# Patient Record
Sex: Male | Born: 1945 | Race: White | Hispanic: No | Marital: Married | Smoking: Former smoker
Health system: Southern US, Community
[De-identification: ages and names within clinical notes are randomized; demographics above are authoritative.]

## PROBLEM LIST (undated history)

## (undated) DIAGNOSIS — I25810 Atherosclerosis of coronary artery bypass graft(s) without angina pectoris: Secondary | ICD-10-CM

## (undated) DIAGNOSIS — Z87448 Personal history of other diseases of urinary system: Secondary | ICD-10-CM

## (undated) DIAGNOSIS — E785 Hyperlipidemia, unspecified: Secondary | ICD-10-CM

## (undated) DIAGNOSIS — I10 Essential (primary) hypertension: Secondary | ICD-10-CM

## (undated) DIAGNOSIS — I83009 Varicose veins of unspecified lower extremity with ulcer of unspecified site: Secondary | ICD-10-CM

## (undated) DIAGNOSIS — I6529 Occlusion and stenosis of unspecified carotid artery: Secondary | ICD-10-CM

## (undated) DIAGNOSIS — I2089 Other forms of angina pectoris: Secondary | ICD-10-CM

## (undated) DIAGNOSIS — T508X5A Adverse effect of diagnostic agents, initial encounter: Secondary | ICD-10-CM

## (undated) DIAGNOSIS — I219 Acute myocardial infarction, unspecified: Secondary | ICD-10-CM

## (undated) DIAGNOSIS — I5032 Chronic diastolic (congestive) heart failure: Secondary | ICD-10-CM

## (undated) DIAGNOSIS — M170 Bilateral primary osteoarthritis of knee: Secondary | ICD-10-CM

## (undated) DIAGNOSIS — Z955 Presence of coronary angioplasty implant and graft: Secondary | ICD-10-CM

## (undated) DIAGNOSIS — N182 Chronic kidney disease, stage 2 (mild): Secondary | ICD-10-CM

## (undated) DIAGNOSIS — N141 Nephropathy induced by other drugs, medicaments and biological substances: Secondary | ICD-10-CM

## (undated) DIAGNOSIS — L97909 Non-pressure chronic ulcer of unspecified part of unspecified lower leg with unspecified severity: Secondary | ICD-10-CM

## (undated) DIAGNOSIS — R0602 Shortness of breath: Secondary | ICD-10-CM

## (undated) DIAGNOSIS — IMO0002 Reserved for concepts with insufficient information to code with codable children: Secondary | ICD-10-CM

## (undated) DIAGNOSIS — I208 Other forms of angina pectoris: Secondary | ICD-10-CM

## (undated) DIAGNOSIS — K219 Gastro-esophageal reflux disease without esophagitis: Secondary | ICD-10-CM

## (undated) DIAGNOSIS — E1165 Type 2 diabetes mellitus with hyperglycemia: Secondary | ICD-10-CM

## (undated) DIAGNOSIS — E662 Morbid (severe) obesity with alveolar hypoventilation: Secondary | ICD-10-CM

## (undated) DIAGNOSIS — I2510 Atherosclerotic heart disease of native coronary artery without angina pectoris: Secondary | ICD-10-CM

## (undated) DIAGNOSIS — E11319 Type 2 diabetes mellitus with unspecified diabetic retinopathy without macular edema: Secondary | ICD-10-CM

## (undated) DIAGNOSIS — I272 Pulmonary hypertension, unspecified: Secondary | ICD-10-CM

## (undated) DIAGNOSIS — E1139 Type 2 diabetes mellitus with other diabetic ophthalmic complication: Secondary | ICD-10-CM

## (undated) HISTORY — PX: TONSILLECTOMY AND ADENOIDECTOMY: SUR1326

## (undated) HISTORY — DX: Type 2 diabetes mellitus with other diabetic ophthalmic complication: E11.39

## (undated) HISTORY — DX: Chronic diastolic (congestive) heart failure: I50.32

## (undated) HISTORY — DX: Atherosclerosis of coronary artery bypass graft(s) without angina pectoris: I25.810

## (undated) HISTORY — DX: Non-pressure chronic ulcer of unspecified part of unspecified lower leg with unspecified severity: I83.009

## (undated) HISTORY — DX: Varicose veins of unspecified lower extremity with ulcer of unspecified site: L97.909

## (undated) HISTORY — DX: Personal history of other diseases of urinary system: Z87.448

## (undated) HISTORY — DX: Presence of coronary angioplasty implant and graft: Z95.5

## (undated) HISTORY — DX: Essential (primary) hypertension: I10

## (undated) HISTORY — DX: Occlusion and stenosis of unspecified carotid artery: I65.29

## (undated) HISTORY — DX: Atherosclerotic heart disease of native coronary artery without angina pectoris: I25.10

## (undated) HISTORY — DX: Other forms of angina pectoris: I20.8

## (undated) HISTORY — PX: APPENDECTOMY: SHX54

## (undated) HISTORY — DX: Type 2 diabetes mellitus with hyperglycemia: E11.65

## (undated) HISTORY — DX: Type 2 diabetes mellitus with unspecified diabetic retinopathy without macular edema: E11.319

## (undated) HISTORY — DX: Reserved for concepts with insufficient information to code with codable children: IMO0002

## (undated) HISTORY — DX: Other forms of angina pectoris: I20.89

## (undated) HISTORY — DX: Morbid (severe) obesity with alveolar hypoventilation: E66.2

---

## 1993-05-05 DIAGNOSIS — I219 Acute myocardial infarction, unspecified: Secondary | ICD-10-CM

## 1993-05-05 DIAGNOSIS — I2510 Atherosclerotic heart disease of native coronary artery without angina pectoris: Secondary | ICD-10-CM

## 1993-05-05 HISTORY — DX: Atherosclerotic heart disease of native coronary artery without angina pectoris: I25.10

## 1993-05-05 HISTORY — DX: Acute myocardial infarction, unspecified: I21.9

## 1994-05-05 HISTORY — PX: CORONARY ARTERY BYPASS GRAFT: SHX141

## 1994-06-06 HISTORY — PX: CARDIAC CATHETERIZATION: SHX172

## 2002-01-21 HISTORY — PX: OTHER SURGICAL HISTORY: SHX169

## 2006-05-05 HISTORY — PX: CORONARY ARTERY BYPASS GRAFT: SHX141

## 2006-05-11 ENCOUNTER — Encounter: Admission: RE | Admit: 2006-05-11 | Discharge: 2006-05-11 | Payer: Self-pay | Admitting: Cardiology

## 2006-05-12 ENCOUNTER — Inpatient Hospital Stay (HOSPITAL_COMMUNITY): Admission: EM | Admit: 2006-05-12 | Discharge: 2006-05-23 | Payer: Self-pay | Admitting: Cardiovascular Disease

## 2006-05-13 ENCOUNTER — Encounter: Payer: Self-pay | Admitting: Vascular Surgery

## 2006-05-13 HISTORY — PX: CARDIAC CATHETERIZATION: SHX172

## 2006-05-14 ENCOUNTER — Encounter: Payer: Self-pay | Admitting: Vascular Surgery

## 2006-06-16 ENCOUNTER — Ambulatory Visit: Payer: Self-pay | Admitting: Surgery

## 2006-06-16 ENCOUNTER — Encounter: Admission: RE | Admit: 2006-06-16 | Discharge: 2006-06-16 | Payer: Self-pay | Admitting: Surgery

## 2006-07-28 ENCOUNTER — Ambulatory Visit: Payer: Self-pay | Admitting: Surgery

## 2006-09-25 ENCOUNTER — Encounter: Admission: RE | Admit: 2006-09-25 | Discharge: 2006-09-25 | Payer: Self-pay | Admitting: Cardiology

## 2006-10-02 ENCOUNTER — Ambulatory Visit: Payer: Self-pay | Admitting: Cardiothoracic Surgery

## 2006-10-02 ENCOUNTER — Inpatient Hospital Stay (HOSPITAL_COMMUNITY): Admission: RE | Admit: 2006-10-02 | Discharge: 2006-10-06 | Payer: Self-pay | Admitting: Cardiology

## 2006-10-02 HISTORY — PX: CARDIAC CATHETERIZATION: SHX172

## 2006-11-02 ENCOUNTER — Ambulatory Visit: Payer: Self-pay | Admitting: Vascular Surgery

## 2008-01-14 IMAGING — CR DG CHEST 2V
2 series · 2 of 2 positions shown · non-contrast
Comparison: 06/16/2006

CLINICAL DATA: Cardiac catheter, preop, chest pain, shortness of breath, smoker

CHEST - 2 VIEW:

[w chest pa *]
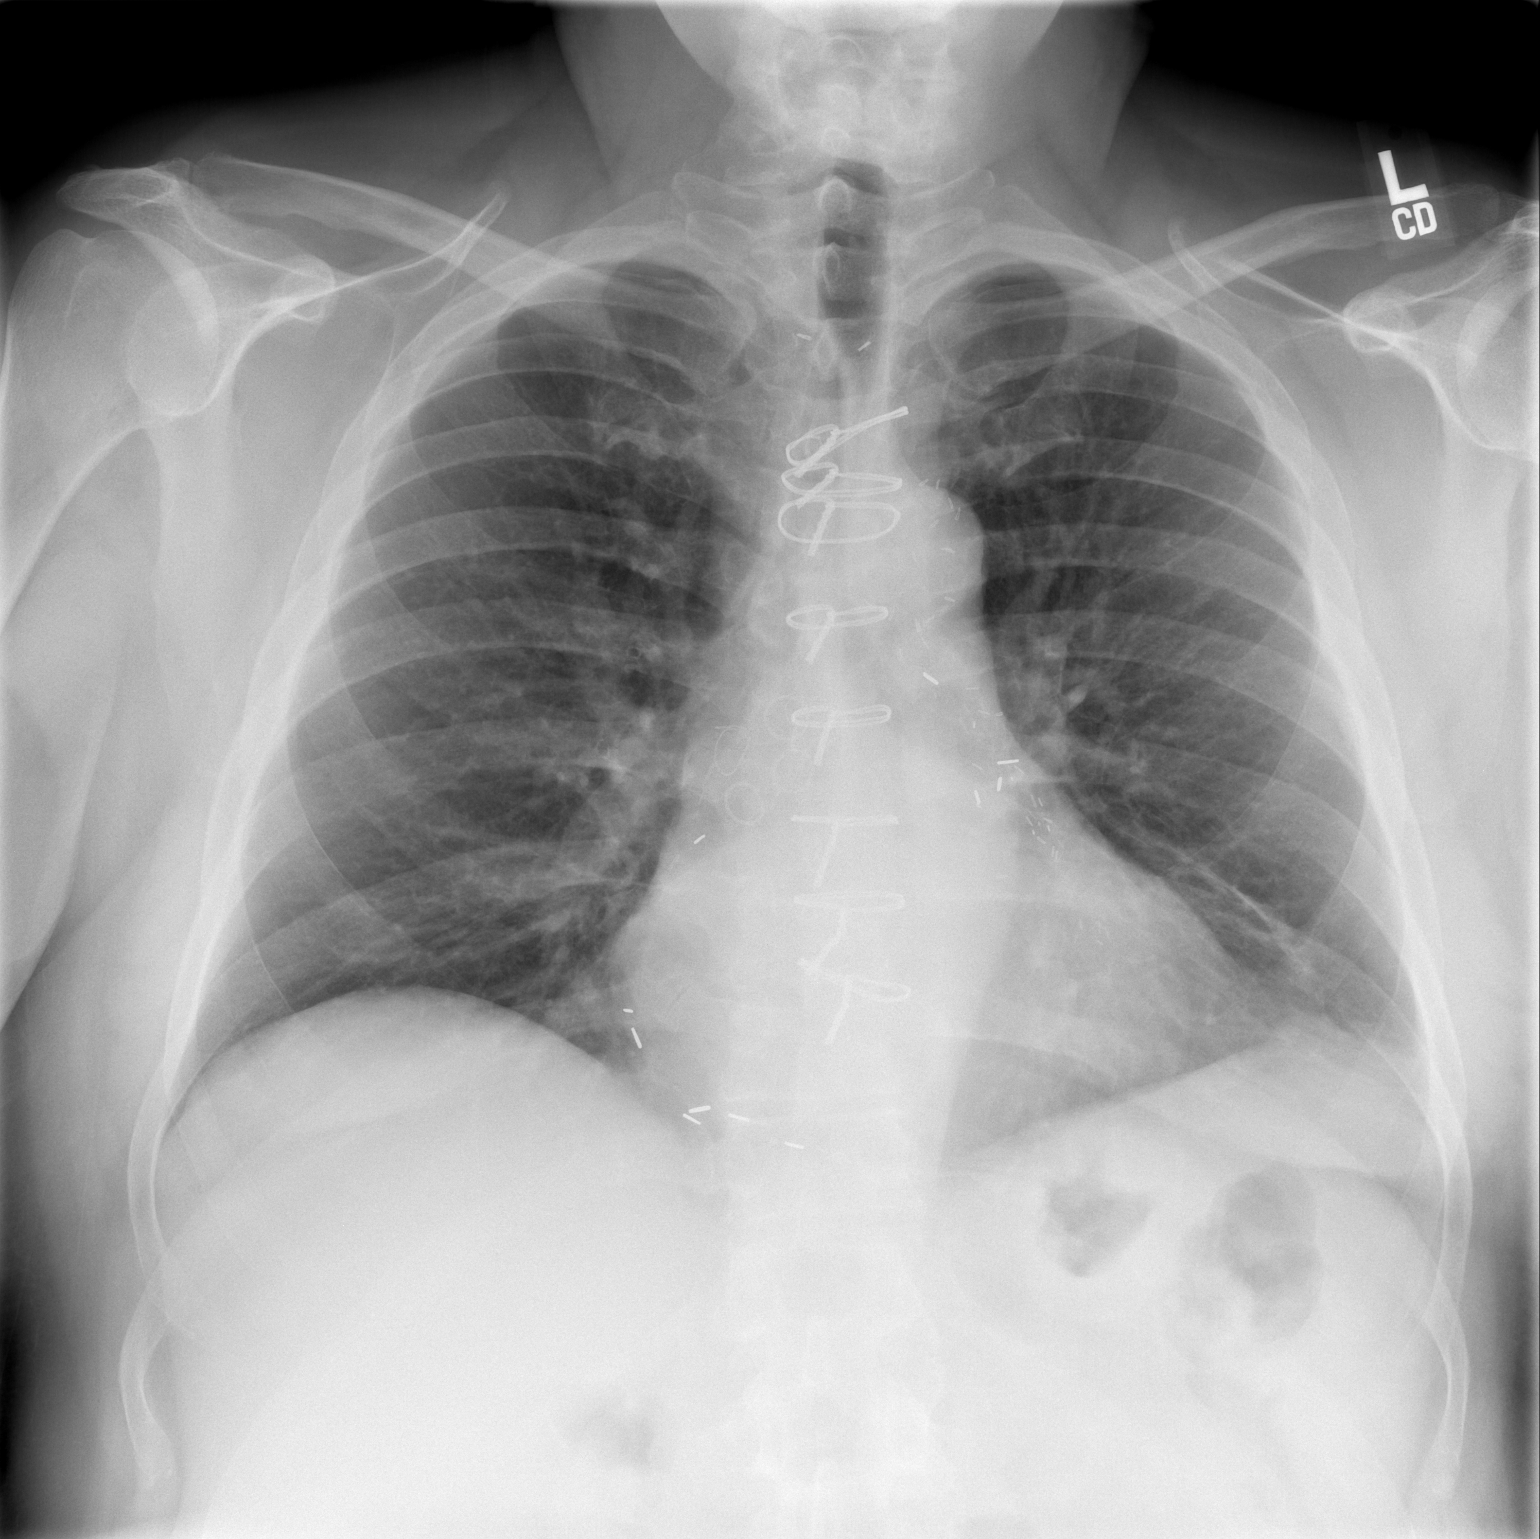

[w chest lat *]
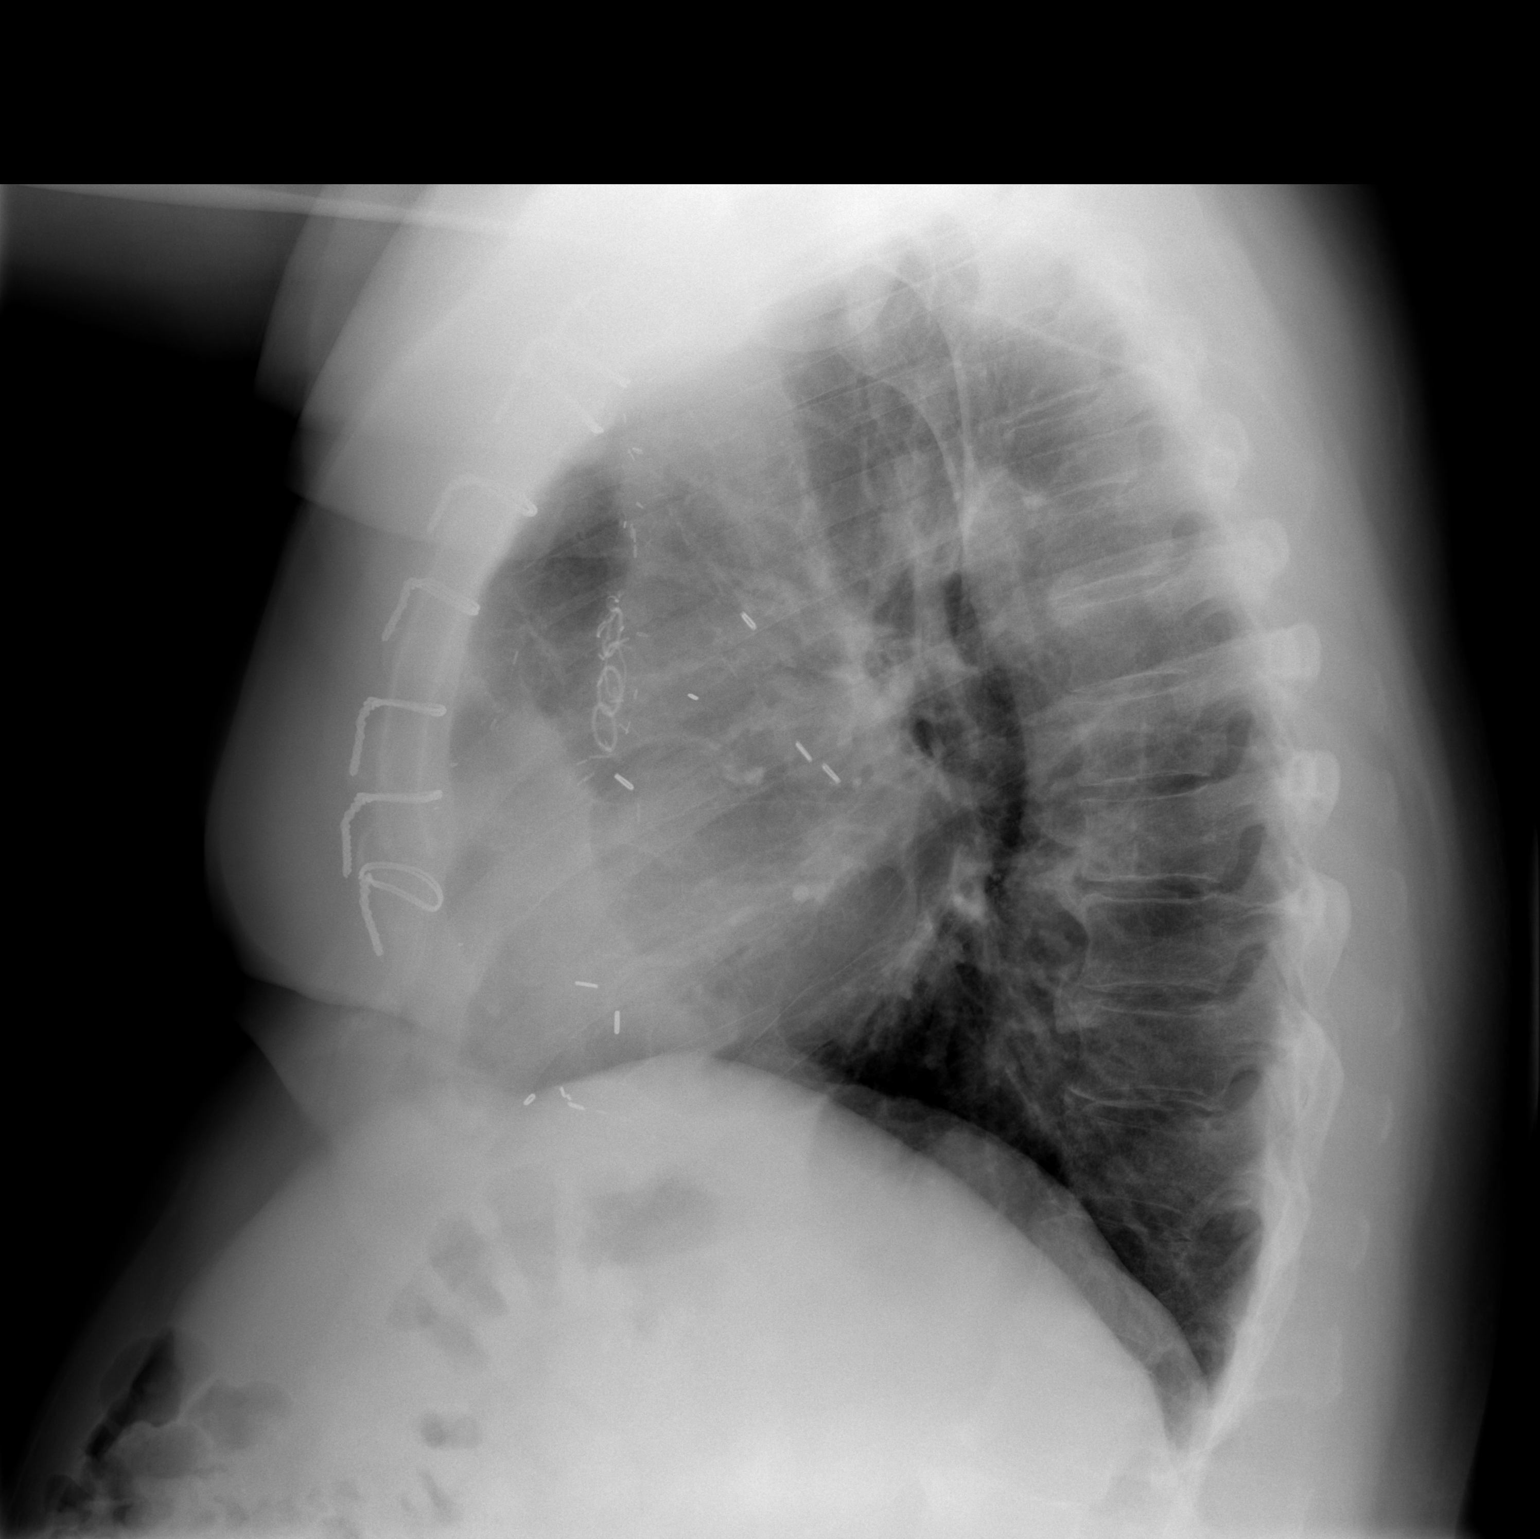

[2 of 2 positions shown; findings below may reference images not displayed]

FINDINGS: Patient status post CABG. Heart is upper limits of normal in size.
There is scarring within the lingula. Right lung clear. No effusions.
Degenerative changes in the thoracic spine.
IMPRESSION: Lingular scar.

Prior CABG. Borderline heart size.

## 2009-05-05 DIAGNOSIS — I5032 Chronic diastolic (congestive) heart failure: Secondary | ICD-10-CM

## 2009-05-05 HISTORY — DX: Chronic diastolic (congestive) heart failure: I50.32

## 2009-07-23 ENCOUNTER — Inpatient Hospital Stay (HOSPITAL_COMMUNITY): Admission: EM | Admit: 2009-07-23 | Discharge: 2009-07-27 | Payer: Self-pay | Admitting: Emergency Medicine

## 2009-07-24 ENCOUNTER — Encounter (INDEPENDENT_AMBULATORY_CARE_PROVIDER_SITE_OTHER): Payer: Self-pay | Admitting: Cardiology

## 2009-08-03 HISTORY — PX: CORONARY ANGIOPLASTY WITH STENT PLACEMENT: SHX49

## 2009-08-10 ENCOUNTER — Observation Stay (HOSPITAL_COMMUNITY): Admission: RE | Admit: 2009-08-10 | Discharge: 2009-08-11 | Payer: Self-pay | Admitting: Cardiology

## 2009-08-10 HISTORY — PX: CARDIAC CATHETERIZATION: SHX172

## 2010-03-04 ENCOUNTER — Encounter: Admission: RE | Admit: 2010-03-04 | Discharge: 2010-03-04 | Payer: Self-pay | Admitting: Cardiology

## 2010-03-08 ENCOUNTER — Ambulatory Visit (HOSPITAL_COMMUNITY)
Admission: RE | Admit: 2010-03-08 | Discharge: 2010-03-09 | Payer: Self-pay | Source: Home / Self Care | Admitting: Cardiology

## 2010-03-08 HISTORY — PX: CORONARY ANGIOPLASTY WITH STENT PLACEMENT: SHX49

## 2010-04-10 ENCOUNTER — Ambulatory Visit: Payer: Self-pay | Admitting: Cardiothoracic Surgery

## 2010-05-22 ENCOUNTER — Observation Stay (HOSPITAL_COMMUNITY)
Admission: EM | Admit: 2010-05-22 | Discharge: 2010-05-24 | Payer: Self-pay | Source: Home / Self Care | Attending: Cardiology | Admitting: Cardiology

## 2010-05-27 LAB — COMPREHENSIVE METABOLIC PANEL
ALT: 20 U/L (ref 0–53)
AST: 18 U/L (ref 0–37)
Albumin: 3.9 g/dL (ref 3.5–5.2)
Alkaline Phosphatase: 105 U/L (ref 39–117)
BUN: 22 mg/dL (ref 6–23)
Chloride: 97 mEq/L (ref 96–112)
Potassium: 4.4 mEq/L (ref 3.5–5.1)
Sodium: 141 mEq/L (ref 135–145)
Total Bilirubin: 0.7 mg/dL (ref 0.3–1.2)

## 2010-05-27 LAB — BASIC METABOLIC PANEL
BUN: 20 mg/dL (ref 6–23)
CO2: 34 mEq/L — ABNORMAL HIGH (ref 19–32)
Calcium: 9.4 mg/dL (ref 8.4–10.5)
Chloride: 94 mEq/L — ABNORMAL LOW (ref 96–112)
GFR calc Af Amer: >60 mL/min (ref >60)
GFR calc Af Amer: >60 mL/min (ref >60)
GFR calc non Af Amer: >60 mL/min (ref >60)
GFR calc non Af Amer: 57 mL/min — ABNORMAL LOW (ref >60)
Glucose, Bld: 209 mg/dL — ABNORMAL HIGH (ref 70–99)
Potassium: 3.7 mEq/L (ref 3.5–5.1)
Potassium: 3.9 mEq/L (ref 3.5–5.1)
Sodium: 140 mEq/L (ref 135–145)
Sodium: 139 mEq/L (ref 135–145)
Sodium: 136 mEq/L (ref 135–145)

## 2010-05-27 LAB — CBC
Hemoglobin: 13.9 g/dL (ref 13.0–17.0)
MCH: 28.3 pg (ref 26.0–34.0)
MCHC: 33 g/dL (ref 30.0–36.0)

## 2010-05-27 LAB — CARDIAC PANEL(CRET KIN+CKTOT+MB+TROPI)
CK, MB: 1.6 ng/mL (ref 0.3–4.0)
CK, MB: 1.7 ng/mL (ref 0.3–4.0)
Relative Index: INVALID (ref 0.0–2.5)
Total CK: 47 U/L (ref 7–232)
Total CK: 48 U/L (ref 7–232)

## 2010-05-27 LAB — BRAIN NATRIURETIC PEPTIDE: Pro B Natriuretic peptide (BNP): 42 pg/mL (ref 0.0–100.0)

## 2010-05-27 LAB — GLUCOSE, CAPILLARY
Glucose-Capillary: 192 mg/dL — ABNORMAL HIGH (ref 70–99)
Glucose-Capillary: 199 mg/dL — ABNORMAL HIGH (ref 70–99)
Glucose-Capillary: 241 mg/dL — ABNORMAL HIGH (ref 70–99)

## 2010-05-30 NOTE — Discharge Summary (Addendum)
NAMEJENCARLO, [name]             ACCOUNT NO.:  [id number]  MEDICAL RECORD NO.:  [id number]          [facility] TYPE:  OBV  LOCATION:  3735                         FACILITY:  [facility]  PHYSICIAN:  Thereasa Solo. Rima Blizzard, M.D. DATE OF BIRTH:  01/02/1946  DATE OF ADMISSION:  05/22/2010 DATE OF DISCHARGE:  05/24/2010                              DISCHARGE SUMMARY   DISCHARGE DIAGNOSES: 1. 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 2. Grade 3 diastolic dysfunction with ejection fraction 50-55% by 2-D     echo May 2011. 3. Chronic angina, stable. 4. Coronary artery disease with history of bypass grafting and redo     bypass, last intervention in November 2011. 5. Diabetes mellitus insulin dependent, stable. 6. Hypertension stable. 7. Dyslipidemia, intolerant to statins, stable.  DISCHARGE CONDITION:  Much improved with weight loss.  WEIGHT AT DISCHARGE:  130.6 kg down from admission weight in computer of 136 kg.Marland Kitchen  DISCHARGE INSTRUCTIONS: 1. Increase activity slowly.  May shower. 2. Low-sodium heart-healthy diabetic diet. 3. Use triamcinolone cream over-Douglas-counter to legs and then wrap with     Ace wrap.  He discussed this with wound nurse here in [facility]. 4. Follow up with [name], June 05, 2010 at 4 p.m.  DISCHARGE MEDICATIONS:  See medication reconciliation sheet.  Douglas medications reviewed with [name] and are what he was admitted with and as his edema is gone, this is what [name] wanted him discharged home with.  HOSPITAL COURSE:  [name] notified [name] on May 22, 2010 that he had had recent gout and had been on colchicine and now had severe lower extremity edema.  He was having to keep his legs in a bucket at night because they were leaking fluid.  He also has diastolic heart failure, grade 3 diastolic dysfunction and his last echo in May 2011.  He  had had 4 days of weeping in [place] lower extremities prior to this admission.  He had denied orthopnea and indeed on admission had weepy legs and significant edema.  He was given diuretics, though his medications were not appropriately added initially.  This was corrected and he began diuresing and his weight here in [facility] hospital dropped from 136 kg to 130 kg and at [place] time of discharge, his legs are much improved.  He has no pitting edema, lot of wrinkles, and he is no longer draining.  He does have areas where he was draining and we had wound care see his legs and they recommended triamcinolone over-Douglas-counter and Ace wrap though he does not want to use Ace wrap but he will use triamcinolone.  Please note [name] does not do well with Lasix alone.  He tried increasing that at home and had increasing edema.  Zaroxolyn seems to work better for him.  We are using Zaroxolyn alone at 5 mg a day and he increases it to 10 mg a day to once or twice a week as needed when he has increased edema which he will continue to  do.  At discharge, [name] is stable.  No chest pain, no shortness of breath.  LABORATORY DATA:  Sodium at discharge 136, potassium 3.7, chloride 89, CO2 34, glucose 218, BUN 19, creatinine 1.27, and calcium 9.4.  Cardiac enzymes were done with his chronic angina, CK 47, MB 1.4, and troponin I 0.02 was done 3 times that were all negative.  Glycohemoglobin was 8.2.  BNP was 42.  Hemoglobin 13.9, hematocrit 42.1, WBC 12.3, and platelets 235,0000.  RADIOLOGY:  Chest x-ray post CABG, mild cardiomegaly, pulmonary vascular prominence, most notably centrally calcified mildly tortuous aorta, scarring left base, no segmental infiltrate or pneumothorax.  At discharge, [name] is stable.  [place] plan will be for discharge today.  He will be seen by Dr. Herbie Baltimore prior to discharge and will follow up with [name].  Please note he was given IV Demadex here in [facility]  which precipitated his diuresing.     Darcella Gasman. Ingold, N.P.   ______________________________ Thereasa Solo Whitman Meinhardt, M.D.    LRI/MEDQ  D:  05/24/2010  T:  05/24/2010  Job:  [id number]  cc:   Thereasa Solo. Torunn Chancellor, M.D. Dr. Eveline Keto  Electronically Signed by Nada Boozer N.P. on 05/24/2010 07:09:48 PM Electronically Signed by [name] M.D. on 05/30/2010 04:01:26 PM

## 2010-07-16 LAB — CBC
MCH: 28.1 pg (ref 26.0–34.0)
MCHC: 32.3 g/dL (ref 30.0–36.0)
RDW: 14.9 % (ref 11.5–15.5)

## 2010-07-16 LAB — GLUCOSE, CAPILLARY
Glucose-Capillary: 169 mg/dL — ABNORMAL HIGH (ref 70–99)
Glucose-Capillary: 198 mg/dL — ABNORMAL HIGH (ref 70–99)
Glucose-Capillary: 205 mg/dL — ABNORMAL HIGH (ref 70–99)

## 2010-07-16 LAB — BASIC METABOLIC PANEL
BUN: 13 mg/dL (ref 6–23)
Calcium: 9.1 mg/dL (ref 8.4–10.5)
Creatinine, Ser: 0.93 mg/dL (ref 0.4–1.5)
GFR calc non Af Amer: >60 mL/min (ref >60)
Glucose, Bld: 181 mg/dL — ABNORMAL HIGH (ref 70–99)

## 2010-07-24 LAB — CBC
HCT: 36.3 % — ABNORMAL LOW (ref 39.0–52.0)
MCHC: 33 g/dL (ref 30.0–36.0)
MCV: 86.7 fL (ref 78.0–100.0)
Platelets: 186 10*3/uL (ref 150–400)
RBC: 4.19 MIL/uL — ABNORMAL LOW (ref 4.22–5.81)

## 2010-07-24 LAB — BASIC METABOLIC PANEL
BUN: 13 mg/dL (ref 6–23)
CO2: 28 mEq/L (ref 19–32)
Chloride: 105 mEq/L (ref 96–112)
Creatinine, Ser: 0.91 mg/dL (ref 0.4–1.5)
Potassium: 4.2 mEq/L (ref 3.5–5.1)

## 2010-07-24 LAB — GLUCOSE, CAPILLARY
Glucose-Capillary: 171 mg/dL — ABNORMAL HIGH (ref 70–99)
Glucose-Capillary: 239 mg/dL — ABNORMAL HIGH (ref 70–99)

## 2010-07-28 LAB — CBC
HCT: 36.3 % — ABNORMAL LOW (ref 39.0–52.0)
HCT: 36.9 % — ABNORMAL LOW (ref 39.0–52.0)
HCT: 37.2 % — ABNORMAL LOW (ref 39.0–52.0)
HCT: 37.3 % — ABNORMAL LOW (ref 39.0–52.0)
Hemoglobin: 12.3 g/dL — ABNORMAL LOW (ref 13.0–17.0)
Hemoglobin: 12.6 g/dL — ABNORMAL LOW (ref 13.0–17.0)
Hemoglobin: 12.7 g/dL — ABNORMAL LOW (ref 13.0–17.0)
MCHC: 34 g/dL (ref 30.0–36.0)
MCV: 85.9 fL (ref 78.0–100.0)
MCV: 86.2 fL (ref 78.0–100.0)
Platelets: 178 10*3/uL (ref 150–400)
Platelets: 186 10*3/uL (ref 150–400)
RBC: 4.22 MIL/uL (ref 4.22–5.81)
RDW: 15.2 % (ref 11.5–15.5)
RDW: 15.5 % (ref 11.5–15.5)
WBC: 10.8 10*3/uL — ABNORMAL HIGH (ref 4.0–10.5)
WBC: 11.8 10*3/uL — ABNORMAL HIGH (ref 4.0–10.5)
WBC: 11.9 10*3/uL — ABNORMAL HIGH (ref 4.0–10.5)

## 2010-07-28 LAB — HEPARIN LEVEL (UNFRACTIONATED)
Heparin Unfractionated: 0.11 IU/mL — ABNORMAL LOW (ref 0.30–0.70)
Heparin Unfractionated: 0.25 IU/mL — ABNORMAL LOW (ref 0.30–0.70)
Heparin Unfractionated: 0.26 IU/mL — ABNORMAL LOW (ref 0.30–0.70)
Heparin Unfractionated: 0.33 IU/mL (ref 0.30–0.70)

## 2010-07-28 LAB — BASIC METABOLIC PANEL
BUN: 32 mg/dL — ABNORMAL HIGH (ref 6–23)
Chloride: 95 mEq/L — ABNORMAL LOW (ref 96–112)
Chloride: 98 mEq/L (ref 96–112)
Creatinine, Ser: 0.96 mg/dL (ref 0.4–1.5)
GFR calc Af Amer: >60 mL/min (ref >60)
GFR calc non Af Amer: >60 mL/min (ref >60)
GFR calc non Af Amer: >60 mL/min (ref >60)
Glucose, Bld: 248 mg/dL — ABNORMAL HIGH (ref 70–99)
Potassium: 3.8 mEq/L (ref 3.5–5.1)
Sodium: 138 mEq/L (ref 135–145)

## 2010-07-28 LAB — LIPID PANEL
HDL: 28 mg/dL — ABNORMAL LOW (ref >39)
Total CHOL/HDL Ratio: 9 RATIO
VLDL: 54 mg/dL — ABNORMAL HIGH (ref 0–40)

## 2010-07-28 LAB — GLUCOSE, CAPILLARY
Glucose-Capillary: 185 mg/dL — ABNORMAL HIGH (ref 70–99)
Glucose-Capillary: 200 mg/dL — ABNORMAL HIGH (ref 70–99)
Glucose-Capillary: 224 mg/dL — ABNORMAL HIGH (ref 70–99)
Glucose-Capillary: 225 mg/dL — ABNORMAL HIGH (ref 70–99)
Glucose-Capillary: 276 mg/dL — ABNORMAL HIGH (ref 70–99)
Glucose-Capillary: 305 mg/dL — ABNORMAL HIGH (ref 70–99)
Glucose-Capillary: 312 mg/dL — ABNORMAL HIGH (ref 70–99)
Glucose-Capillary: 340 mg/dL — ABNORMAL HIGH (ref 70–99)

## 2010-07-28 LAB — CARDIAC PANEL(CRET KIN+CKTOT+MB+TROPI)
CK, MB: 18.2 ng/mL (ref 0.3–4.0)
CK, MB: 3.4 ng/mL (ref 0.3–4.0)
Relative Index: 3.3 — ABNORMAL HIGH (ref 0.0–2.5)
Relative Index: 5.6 — ABNORMAL HIGH (ref 0.0–2.5)
Relative Index: INVALID (ref 0.0–2.5)
Total CK: 186 U/L (ref 7–232)
Troponin I: 2.59 ng/mL (ref 0.00–0.06)
Troponin I: 5.71 ng/mL (ref 0.00–0.06)
Troponin I: 6.49 ng/mL (ref 0.00–0.06)

## 2010-07-28 LAB — MAGNESIUM
Magnesium: 1.6 mg/dL (ref 1.5–2.5)
Magnesium: 2 mg/dL (ref 1.5–2.5)

## 2010-07-28 LAB — TSH: TSH: 3.918 u[IU]/mL (ref 0.350–4.500)

## 2010-07-28 LAB — APTT: aPTT: 33 seconds (ref 24–37)

## 2010-07-28 LAB — BRAIN NATRIURETIC PEPTIDE
Pro B Natriuretic peptide (BNP): 194 pg/mL — ABNORMAL HIGH (ref 0.0–100.0)
Pro B Natriuretic peptide (BNP): 295 pg/mL — ABNORMAL HIGH (ref 0.0–100.0)

## 2010-07-28 LAB — HEMOGLOBIN A1C: Hgb A1c MFr Bld: 8.8 % — ABNORMAL HIGH (ref 4.6–6.1)

## 2010-07-28 LAB — CK TOTAL AND CKMB (NOT AT ARMC): Relative Index: 7.5 — ABNORMAL HIGH (ref 0.0–2.5)

## 2010-09-17 NOTE — Cardiovascular Report (Signed)
NAMEPAU, BANH NO.:  [id number]   MEDICAL RECORD NO.:  [id number]          PATIENT TYPE:  OIB   LOCATION:  2899                         FACILITY:  [facility]   PHYSICIAN:  Thereasa Solo. Little, M.D. DATE OF BIRTH:  06/25/1945   DATE OF PROCEDURE:  10/02/2006  DATE OF DISCHARGE:                            CARDIAC CATHETERIZATION   This unfortunate 65 year old male has COPD and is oxygen dependent  usually at night time.  He had original bypass surgery in 1995, and  presented with unstable angina January 2008, and underwent repeat bypass  grafting.  He has had now for 6 weeks recurrent episodes of anginal pain  that has escalated to almost daily occurrences and he has used a bottle  of nitroglycerin in 1 week.  The pain even occurs at rest.  He is  brought in for outpatient cardiac catheterization.  His perception is  that the pain is identical to what he had before surgery.   After obtaining informed consent, the patient was prepped and draped in  the usual sterile fashion exposing the right groin.  Applying local  anesthetic with 1% Xylocaine, the Seldinger technique was employed and a  5-French introducer sheath was placed into the right femoral artery.  Left and right coronary arteriography and graft visualization x6 and  ventriculography in the RAO projection was performed.   COMPLICATIONS:  None.   EQUIPMENT:  5-French Judkins configuration catheters, an internal  mammary artery catheter was used to cannulate the IMA, and a left  coronary bypass catheter was used to cannulate the saphenous vein graft  to the OM system.   TOTAL CONTRAST USED:  165 mL.   RESULTS:  1. Hemodynamic monitoring:  Central aortic pressure was 117/67.  Left      ventricular pressure was 118/13.  There was no aortic valve      gradient noted at the time of pullback.  2. Ventriculography.  Ventriculography performed that the end of the      procedure using 25 mL of contrast at 12  mL per second revealed mild      hypofilling of the left ventricle.  There was overall normal LV      systolic function with the posterior basilar segment being mildly      hypokinetic.  Ejection fraction of 50%.  End-diastolic pressure was      16.  3. Coronary angiograms:  Left main was 100% occluded ostially.  The      left main and LAD was visualized but only because of calcification.      Right coronary artery 100% occluded on cath January 2008.   GRAFTS BYPASS SURGERY FROM JANUARY 2008:  1. Saphenous vein graft to the PDA.  This vessel is thread-like in      appearance.  It is basically nonfunctioning  2. Free radial graft to the OM vessel.  This vessel is thread-like,      nonfunctioning.   BYPASS SURGERY 1995:  1. Saphenous vein graft to the diagonal 100% occluded ostial.  2. Saphenous vein graft to the PDA.  The graft has mid-50 and 60%  areas of narrowing with diffuse filling defects throughout      consistent with thrombus material in the vein.  The PDA itself is      okay, but as the PDA fills retrograde, there is a 95% area of      narrowing at the beginning of a rather large posterolateral vessel.   SAPHENOUS VEIN GRAFT TO THE OM:  1. This graft has areas of 40 and 60% narrowing in the ostial and      proximal segment with diffuse 20 and 30% areas of narrowing      throughout.  The OM was adequately visualized and it was retrograde      filling back into the native circulation.  2. Internal mammary artery to the LAD.  This was a large caliber      vessel that inserted into the mid-LAD.  The LAD was well visualized      and free of disease.  Collaterals were noted to the diagonal and      faint collaterals to the OM vessel.   CONCLUSION:  1. Well-preserved left ventricular systolic function.  2. Loss of both of the bypass grafts from his January 2008 coronary      artery bypass graft.  3. Patent internal mammary artery to the left anterior descending.  4. A 100%  occluded graft to the diagonal.  5. Diffusely diseased graft to both the posterior descending artery      and obtuse marginal.  6. Nonprotected high-grade stenosis between the posterior descending      artery and PL, which is probably the majority of his anginal      complaint.   When I injected his internal mammary artery.  He had anginal pain and  was placed on nitroglycerine.  This has resolved.  I plan to continue  the nitroglycerine and start him on IV heparin.  I have asked CVTS to  reevaluate him.  Attempts at percutaneous intervention to these 13-year-  old grafts are clearly going to result in distal embolization and long-  term patency of these grafts is marginal.  It appears that with  significant competitive flow from the '95 grafts, both the '08 grafts  have withered and are now nonfunctioning.  This is a very complicated  situation.  I will seek other opinions.           ______________________________  Thereasa Solo Little, M.D.     ABL/MEDQ  D:  10/02/2006  T:  10/02/2006  Job:  [id number]   cc:   Feliciana Rossetti, MD  Evelene Croon, M.D.

## 2010-09-17 NOTE — Discharge Summary (Signed)
Ball, Douglas NO.:  [id number]   MEDICAL RECORD NO.:  [id number]          PATIENT TYPE:  INP   LOCATION:  2032                         FACILITY:  [facility]   PHYSICIAN:  Thereasa Solo. Little, M.D. DATE OF BIRTH:  Jan 30, 1946   DATE OF ADMISSION:  10/02/2006  DATE OF DISCHARGE:  10/06/2006                               DISCHARGE SUMMARY   DISCHARGE DIAGNOSES:  1. Angina, now improved.  2. Coronary artery disease with history of coronary artery bypass      graft in 1995 and a redo bypass graft in January 2008.      a.     Loss of both the bypass grafts in January 2008 surgery, and       he has patent left internal mammary artery to left anterior       descending, 100% occluded graft to the diagonal.  Diffusely       diseased graft to both posterior descending artery and obtuse       marginal, and unprotected high-grade stenosis between the       posterior descending artery and posterolateral, which is the       majority of anginal complaint.  3. O2 dependent oxygen at night with negative sleep study for apnea      but positive for O2 saturation or desaturation at night.  4. History of right hemidiaphragm paralysis.  5. Hyperlipidemia.  6. Hypertension.  7. History of chronic lower extremity edema.  8. Peripheral vascular disease with history of right internal carotid      artery stenosis followed by Dr. Arbie Cookey.  9. Diabetes mellitus on insulin.   PROCEDURES:  1. Oct 02, 2006:  Combined left heart cath and graft visualization by      Dr. [name] with results as stated.  2. Consultation with Dr. Kathlee Nations Trigt.   DISCHARGE MEDICATIONS:  1. No Prinivil for now.  2. Stop Vibramycin.  3. Hold metolazone.  4. Aspirin 81 mg daily.  5. Magnesium oxide 400 mg daily.  6. Lasix 80 mg daily alternating with 40 mg every other day.  7. K-Dur 20 mEq daily alternating with 2 mEq every other day.  8. Amaryl 4 mg twice a day.  9. Metoprolol 50 mg twice a day.  10.Imdur 30 mg twice a day.  11.Ranexa, which is new, 500 mg 1 twice a day.  12.Home oxygen as needed.  13.Nitroglycerin sublingual as needed for chest pain.  14.Resume aspart insulin.  15.__________  8 mg daily.  16.May continue COQ10, 75 mg daily and cinnamon as before.  17.Stop Prinivil, Vibramycin, and metolazone.   DISCHARGE INSTRUCTIONS:  1. Increase activity slowly.  May walk up steps.  May shower and      bathe.  No lifting for 2 days.  No driving for 2 days.  No work.  2. Low-sodium, heart-healthy diabetic diet.  3. Wash right cath groin site with soap and water.  Call for any      bleeding or swelling or drainage.  4. Follow up with [name], October 27, 2006, at 3:45.  5. [name]  will discuss with physicians at [facility] and      send him for consultation at [facility].   HISTORY OF PRESENT ILLNESS:  A 65 year old white married male with  history of bypass grafting in 1995 with LIMA to the LAD and saphenous  vein graft to the diagonal branch of the LAD, saphenous vein graft to  the obtuse marginal of the circumflex, saphenous vein graft to the PDA  of the RCA.  Since that time, he had a redo bypass grafting in January  of 2008 by Dr. Laneta Simmers with left radial artery to the OM and saphenous  vein graft to the PDA and had a vein harvest in his right leg by Dr.  Laneta Simmers.  He did fairly well postoperatively with acute blood loss anemia  as well.  He maintained sinus rhythm.  The patient's complaint in the  office was more edema in the lower extremities, erythema around the  ankle.  He was placed on Vibramycin for cellulitis.  He also complained  of chest pain.  He would walk down the driveway or down the street  without any discomfort, other times he can walk down the driveway.  He  would develop chest discomfort just walking in his house.  It is also in  his chest, both arms.  It feels like it did when he had his MIs.  He  says he has shortness of breath with the pain but  no nausea or  diaphoresis.  It does not awaken him from sleep.  He would use up to 1  to 2 nitro, which would help some, and he feels better when he puts his  home oxygen on.  He was seen in the office on Sep 25, 2006.  Medicines  were adjusted with increase of his Imdur to 60 daily and with the  addition of extra 30 during the day if needed.  Also encouraged to use  oxygen with ambulation, and we increased Lasix to 80 daily and potassium  to 20 daily and continued Zaroxolyn, and we added Vibramycin 100 mg  twice a day just to see if it would help clear up the erythema in his  legs.  He had also complained of some rash on his right face, so he went  to Dr. Suan Halter for Dr. Nita Sells, the dermatologist.   REVIEW OF SYSTEMS/FAMILY HISTORY/SOCIAL HISTORY:  See H&P.   ALLERGIES:  PENICILLIN and STATINS.   HOSPITAL COURSE:  [name] was brought in __________  and underwent  cardiac cath with results as previously stated.  He was kept in the  hospital by CVTS.  Cardiothoracic surgery consult was obtained.  Dr. Donata Clay felt that the patient's symptoms were probably due his distal  right coronary system.  The distal right posterolateral branch of the  RCA were too small to graft at the time of his recent bypass.  The  patient still has all patent vein graft to his right coronary and  circumflex systems with some moderate amount of disease.  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  Dr. Donata Clay did not feel that  another attempted surgery would benefit the patient, and he also  discussed the patient with Dr. Laneta Simmers, who was the primary surgeon.   The patient was kept in the hospital, started on Ranexa for pain and  continued the increased dose of the Imdur.  He  actually improved.  Pain  resolved.  He felt he could sleep at night without oxygen.  He did have 2 episodes of angina  on May 31; none on June 1  or 2.  By June 3, he had no further chest  pains, actually felt much better, was active in the hallways, and plans  were to discharge, get another opinion at [facility], and then they  recommended no repeat bypass but to proceed on with EECP.  He was seen  and discharged by [name].      Darcella Gasman. Ingold, N.P.    ______________________________  Thereasa Solo Little, M.D.    LRI/MEDQ  D:  10/06/2006  T:  10/06/2006  Job:  [id number]   cc:   Thereasa Solo. Little, M.D.  Feliciana Rossetti, MD  Evelene Croon, M.D.  Kerin Perna, M.D.

## 2010-09-17 NOTE — Consult Note (Signed)
NEW PATIENT CONSULTATION   LYNNWOOD, BECKFORD  DOB:  Nov 21, 1945                                        April 11, 2010  CHART #:  [id number]   REASON FOR CONSULTATION:  Recurrent coronary artery disease with stable  angina.   HISTORY OF PRESENT ILLNESS:  I was asked to evaluate this 65 year old  obese, diabetic, nonsmoker for possible reoperation for severe  progressive recurrent coronary artery disease.  The patient had initial  four-vessel bypass grafting by Dr. Laneta Simmers in 1995 consisting of left IMA  to the LAD and vein graft to the diagonal, obtuse marginal, and distal  RCA.  He had recurrent angina with progression of disease and in 2008  underwent a new vein graft to the distal right coronary and radial  artery free graft to the distal circumflex.  At that time his vein graft  to the circumflex had severe disease but was patent and his LIMA was  widely patent.  The vein grafts to diagonal RCA were occluded.  He  returned with chest pain and was cathed and found to have occlusion of  the new bypass grafts to the distal RCA and distal circumflex and he was  not felt to be a candidate for redo surgery.  He has been treated with  percutaneously by [name] with stents to the diseased vein graft to  the circumflex for episodes of recurrent angina.  His last  hospitalization was approximately 3 weeks ago when he had a complicated  long but successful PCI of the disease of the proximal and the distal  vein graft to the distal circumflex.  His EF remains approximately 50%  and he is currently stable.  His conduit situation is that there is no  further leg vein available and he has chronic venous stasis changes of  both legs from the mid tibia to the ankle.  His left radial has been  harvested  and his right radial was intact.  He has had mild-to-moderate  carotid disease followed by Dr. Fabienne Bruns.  He has fairly severe  diabetes and has been followed by  several endocrinologists over the  years, most recently Dr. Salomon Fick at [facility].  He is a nonsmoker and he has chronic lower extremity edema.  His cholesterol levels are fairly elevated, but the patient is not sure  of his last lipid panel.  He does not tolerate statins with severe  muscle pain and has been off Vytorin for first several years.  He has  reflux esophagitis, treated well with proton pump inhibitors and is  otherwise fairly sedentary due to his obesity, weight of 300 pounds, and  his lower extremity edema and venous stasis.   MEDICATIONS:  Zaroxolyn, aspirin, nitroglycerin, potassium, Plavix,  metoprolol 50 mg t.i.d., coenzyme, Imdur, lisinopril, Zantac, NPH  insulin, Lasix and NovoLog sliding scale.   PHYSICAL EXAMINATION:  Blood pressure 134/68, pulse 60 and regular,  respirations 18, saturation 95%.  He is 300 pounds and is 5 feet 11  inches tall.  General Appearance:  Pleasant, obese male accompanied by  his wife, no acute distress.  HEENT:  Normocephalic.  Neck:  Without  carotid bruit or adenopathy.  Chest:  Without deformity and a well-  healed sternal incision.  Breath sounds are clear.  Cardiac:  Regular  without S3 gallop or murmur.  Abdomen:  Soft, nontender.  Extremities:  Healed venectomy incisions in both legs and severe venous stasis  dermatitis of both legs from the pretibial area distally.  Neurologic:  Alert and oriented without focal motor deficit.  He has good hand grip  bilaterally and a well-healed left forearm radial artery scar.   LABORATORY DATA:  Reviewed the most recent coronary arteriogram and  intervention by [name], which successfully opened some proximal and  distal disease of the vein graft to the distal circumflex.  I reviewed  Dr. Sharee Pimple operative notes from his 2 previous bypass operations.   The mammary to the LAD is widely patent and this fills the distal right  through collaterals.  The native  circumflex and the native diagonal  vessels are non-graftable.  The circumflex beyond the anastomosis of the  patent vein graft to the distal vessel is too small of a graft according  Dr. Sharee Pimple  last operative note, which would be the very distal  posterolateral circumflex vessel.  The patient was told that another  bypass operation would probably provide no benefit and only subject him  to significant risk of a third time redo operation.  All of his  questions were addressed and he understands the importance of risk  factor optimization and careful medical management.   Kerin Perna, M.D.  Electronically Signed   PV/MEDQ  D:  04/11/2010  T:  04/12/2010  Job:  [id number]

## 2010-09-17 NOTE — Consult Note (Signed)
[name], [name] NO.:  [id number]   MEDICAL RECORD NO.:  [id number]          PATIENT TYPE:  INP   LOCATION:  2032                         FACILITY:  [facility]   PHYSICIAN:  Kerin Perna, M.D.  DATE OF BIRTH:  Nov 08, 1945   DATE OF CONSULTATION:  10/02/2006  DATE OF DISCHARGE:                                 CONSULTATION   REPORT TITLE:  Cardiothoracic Surgical Consultation   REASON FOR CONSULTATION:  Recurrent coronary disease with class IV  unstable angina.   CHIEF COMPLAINT:  Chest pain.   HISTORY OF PRESENT ILLNESS:  I was asked to evaluate this 65 year old  white male diabetic for evaluation and treatment of recently diagnosed  severe recurrent coronary artery disease.  The patient had redo coronary  bypass grafting in January 2008 by Dr. Laneta Simmers for severe recurrent  coronary artery disease and progressive angina.  His original bypass  operation was in 1995, and he had developed disease of the vein grafts  to the right coronary and circumflex systems with a patent left IMA  graft to the LAD.  At the time of redo surgery this year, Dr. Laneta Simmers  placed the radial artery to the circumflex system and a new vein graft  to the distal right coronary.  The left IMA graft was identified and  preserved.  The patient did well for eight weeks but then developed  recurrent chest pain.  He had been evaluated by [name] and was  arranged for an elective diagnostic catheterization today.  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  Because of the occlusion of the recently placed  grafts, a thoracic surgical evaluation was requested.  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   PAST MEDICAL HISTORY:  1. Diabetes.  2. Obesity.  3. Hypertension.  4. Hyperlipidemia.   MEDICATIONS:  1. Aspirin 325.  2. Lopressor 12.5 b.i.d.  3. Altace 2.5 mg daily.  4. Amaryl 4 mg b.i.d.  5. Lasix 40 mg daily.  6. Pain medication p.r.n.   ALLERGIES:  1. PENICILLIN.  2. CHOLESTEROL MEDICATIONS.   SOCIAL HISTORY:  The patient does not smoke or drink alcohol and lives  with his wife.  He is retired.   REVIEW OF SYSTEMS:  Of note, the patient was found to have a left  carotid artery stenosis of 80% by MRA angiogram during his January  hospitalization.  This is being followed closely by [name].  The  patient has had an otherwise uneventful recovery from his bypass surgery  earlier this year and the surgical incision is well-healed.  He has no  remaining vein conduit in either leg and the radial artery site in the  left arm is well healed.   PHYSICAL EXAMINATION:  GENERAL:  The patient is in the catheterization  lab step-down unit  on heparin and  nitroglycerin in no severe distress.  VITAL SIGNS:  Blood pressure is 100/50, pulse 60, respirations 18,  temperature 97.8, saturation 98% on three liters.  HEENT:  Normocephalic.  Dentition is good.  NECK:  Without JVD.  There are no carotid bruits.  He has a well-healed  sternal incision.  CARDIAC:  Regular rhythm without S4, gallop or murmur.  RESPIRATORY:  Breath sounds are clear and equal.  ABDOMEN:  Obese but soft.  EXTREMITIES:  Well-healed venectomy incisions bilaterally and venous  stasis of the tibial areas to the ankle bilaterally.  NEUROLOGIC:  Nonfocal.  The left arm has a well-healed radial artery  harvest incision.   LABORATORY DATA:  I reviewed the coronary arteriogram with [name]  with the above-noted findings.   IMPRESSION AND PLAN:  I agree with Dr. Fredirick Maudlin impression that the  patient's symptoms are probably due to disease of the distal right  coronary system.  At the time of the recent operation,  Dr. Laneta Simmers noted  that the distal right and posterolateral branch of the right coronary  were too small to graft.  The patient still has old patent vein grafts  to his right coronary and circumflex systems with some moderate amount  of disease.  I do not see how another attempt at surgery would benefit  the patient.  The patient will be discussed with Dr. Laneta Simmers for review  in the next 48 hours.   Thank you for the consultation.      Kerin Perna, M.D.  Electronically Signed     PV/MEDQ  D:  10/03/2006  T:  10/03/2006  Job:  [id number]

## 2010-09-17 NOTE — Assessment & Plan Note (Signed)
OFFICE VISIT   [name], [name]  DOB:  1945/12/07                                       11/02/2006  ZOXWR#:[id number]   The patient presents today for evaluation of asymptomatic left internal  carotid artery stenosis.  He is a very unfortunate 65 year old gentleman  who has undergone 2 coronary artery bypass grafting, most recently in  05/2006.  His initial grafting was done in 1995.  He had early  recurrence of ischemia and repeat catheterization in 09/2006 revealed  occlusion of his recent 05/2006 bypasses.  He was not felt to be a  surgical candidate.  He is seeing Dr. Molinda Bailiff Little regarding this and  has been considered for EECP.  At the time of his coronary artery bypass  grafting he was found to have moderate to severe left internal carotid  artery stenosis.  He has had no symptoms progressive with this.  Specifically no amaurosis fugax, transient ischemic attack, or stroke.   PHYSICAL EXAM:  Well-developed, well-nourished white male appearing  stating age of 26.  He has no carotid bruits.  Radial pulses are 2+ bilaterally.   He underwent repeat carotid duplex in our office to compare with January  study around the time of surgery.  This reveals moderate to severe left  internal carotid artery stenosis.  I had a long discussion with the  patient and his wife present.  I do not know of any literature  suggesting the need for more aggressive treatment of moderate to severe  asymptomatic internal carotid artery stenosis in the face of central  EECP for coronary disease.  I will discuss this with [name], but  feel that it is safe to proceed with cardiac treatment as warranted.  He  is scheduled for repeat  duplex evaluation of his carotids to rule out any progression of  disease.  This is scheduled for 6 months.   Larina Earthly, M.D.  Electronically Signed   TFE/MEDQ  D:  11/02/2006  T:  11/03/2006  Job:  [id number]   cc:   Thereasa Solo. Little, M.D.  Evelene Croon, M.D.

## 2010-09-20 NOTE — Discharge Summary (Signed)
[name], [name]             ACCOUNT NO.:  [id number]   MEDICAL RECORD NO.:  [id number]          PATIENT TYPE:  INP   LOCATION:  2030                         FACILITY:  [facility]   PHYSICIAN:  Evelene Croon, M.D.     DATE OF BIRTH:  February 16, 1946   DATE OF ADMISSION:  05/12/2006  DATE OF DISCHARGE:                               DISCHARGE SUMMARY   PRIMARY CARDIOLOGIST:  Dr. Orvan Falconer.   ADMISSION DIAGNOSIS:  Chest pain.   DISCHARGE DIAGNOSES:  1. Unstable angina.  2. Coronary artery disease, status post myocardial infarction, status      post coronary artery bypass graft.  3. Diabetes mellitus.  4. Hypertension.  5. Hyperlipidemia.  6. Postoperative acute blood loss anemia.   PROCEDURE:  1. May 13, 2006, patient underwent cardiac catheterization by Dr.      Orvan Falconer.  2. Patient underwent CT angio of the neck on May 15, 2006, by Dr.      Darrick Penna.  3. May 18, 2006, patient underwent redo coronary artery bypass      grafting x2 (left radial artery to the OM, saphenous vein graft to      the PDA) and the cava vein harvest right thigh by Dr. Laneta Simmers.   HISTORY AND PHYSICAL:  This is a 65 year old Caucasian male with history  of coronary artery disease.  In 1985, he underwent coronary artery  bypass surgery.  Some years prior to that, he had experienced an acute  MI, his cardiac course has been uncomplicated since his coronary artery  bypass surgery.  In the last several weeks, he has been experiencing  several episodes of chest pain, usually after eating.  He saw Dr.  Clarene Duke, scheduled him for cardiac catheterization late this week.  However, the patient experienced an episode of chest pain this evening  after dinner.  He was watching television at the time.  Chest pain is  scattered and ache in his upper arms bilaterally, has progressed into  sharp pressure discomfort across his chest.  Also, experienced pressure  in his back.  This not associated with any dyspnea,  diaphoresis or  nausea.  Patient took a total of 6 nitroglycerin tablets, which did not  alleviate his chest pain and he presented to the emergency department at  [facility].  See dictated history and physical for further  details.   HOSPITAL COURSE:  Patient was transferred from [facility] to  [facility] to further evaluate his chest pain.  Patient's chest pain was  ultimately relieved at [facility] and he was receiving IV  nitroglycerin, heparin, and Integrilin.  Upon arrival to [facility], he received morphine and was chest pain free and otherwise  asymptomatic.  Patient was scheduled to undergo a cardiac cath on  May 14, 2006.   Patient underwent cardiac cath on May 14, 2006, which showed severe  native coronary artery disease and severe bypass graft disease 100%  occluded of the native left main coronary artery, 100% occluded of the  right coronary artery, patent left internal mammary to the left anterior  descending, occluded saphenous vein graft to  the diagonal aorta ostial  site.  Please see dictated cardiac cath by Dr. Orvan Falconer for further  details.  A CVTS consult was then placed.  Dr. Laneta Simmers saw the patient  and agreed to do a CABG; however, he wanted to get a vascular consult  for the patient's left internal carotid artery stenosis.  Carotid  Dopplers showed a 40-60% internal carotid artery stenosis on the right  and 68% in her carotid artery stenosis in the left.  The patient's ABIs  are greater than 1 bilaterally.  His Allen test was within normal limits  bilaterally.  Dr. Darrick Penna saw the patient and obtained a CT angio.  There  is 80% stenosis of the left internal carotid artery and since the  patient is asymptomatic he feels it is ok to proceed with the redo CABG.  The patient was scheduled for redo CABG May 18, 2006.   Prior to the patient's CABG, he was maintained on IV nitroglycerin,  heparin, morphine, and Integrilin.  The  patient had no further episodes  of chest pain.  He remained hemodynamically stable.  Patient's renal  function has remained within normal limits.   May 18, 2006, the patient underwent a CABG x2 by Dr. Laneta Simmers without  any complications.  Postoperatively, the patient is progressing quite  well.  His hospital stay has been uneventful.  The patient has  maintained a normal sinus rhythm.  His blood pressure is well-controlled  in the low 110s over 50s.  The patient is on a low dose of beta-blocker.  Patient's ACE inhibitor will be started at discharge.  Patient did have  some acute blood loss anemia postoperatively; however, he did not  require any blood transfusions and his hemoglobin and hematocrit have  remained stable.  Hemoglobin and hematocrit are 10.3/29.6.  Patient's  renal function has remained well-controlled with a BUN of 8, creatinine  of 0.7.  He did have some postop volume overload.  He is being diuresed  with Lasix and responding appropriately.  The patient does have a  history of diabetes mellitus and his sugars have been slightly  uncontrolled postoperatively.  Patient is currently on 60 units of  Lantus and Amaryl 4 mg b.i.d.  The patient's CBGs have been 146/197/195.  We will get a nutrition consult prior to the patient's leaving.   The patient is ambulating well with cardiac rehab with a steady gait.  He was transferred to 2000 on May 21, 2006.  Patient is continuing  his breathing exercises appropriately.   Patient has maintained a normal sinus rhythm, heart rate in the 80s.  His incisions are clean, dry, and intact and healing well.   DISCHARGE DISPOSITION:  Patient will be discharged home in the next 1 to  2 days provided he remains in normal sinus rhythm, blood pressures well-  controlled, and CBGs are controlled.   MEDICATIONS:  1. Coated aspirin 325 mg p.o. daily.  2. Lopressor 25 mg 1/2 tab p.o. b.i.d. 3. Altace 2.5 mg p.o. daily.  4. Amaryl 4 mg  p.o. b.i.d.  5. Lasix 40 mg p.o. daily x5 days.  6. Potassium chloride 20 mEq p.o. daily x5 days.  7. Zaroxolyn 5 mg p.o. daily.  8. Oxycodone 5 mg 1 to 2 tabs p.o. every 4 hours p.r.n.   INSTRUCTIONS:  Patient is to follow a low-fat, low-salt diet.  No  driving, heavy lifting greater than 10 pounds for 3 weeks.  Patient is  to ambulate 3 times daily and  increase activity as tolerated.  He is to  continue his breathing exercises.  He is to follow a low-fat, low-salt,  diabetic diet.  He may shower and clean his incisions with mild soap and  water.  He is to call the office if any more problems should arise such  as incision erythema, drainage, temp greater than 101.5.   FOLLOWUP:  The patient will have a followup appointment with Dr. Laneta Simmers  on June 16, 2006, at 11 a.m.  Prior to seeing Dr. Laneta Simmers, patient  will have a chest x-ray taken at [facility].  He is call  Dr. Fredirick Maudlin office for an appointment in 2 weeks.      Constance Holster, [place]      Evelene Croon, M.D.  Electronically Signed    JMW/MEDQ  D:  05/21/2006  T:  05/21/2006  Job:  [id number]   cc:   Evelene Croon, M.D.  Thereasa Solo. Little, M.D.

## 2010-09-20 NOTE — Consult Note (Signed)
[name], LIMB NO.:  [id number]   MEDICAL RECORD NO.:  [id number]          PATIENT TYPE:  INP   LOCATION:  2914                         FACILITY:  [facility]   PHYSICIAN:  Evelene Croon, M.D.     DATE OF BIRTH:  1946/02/16   DATE OF CONSULTATION:  05/14/2006  DATE OF DISCHARGE:                                 CONSULTATION   REFERRING PHYSICIAN:  Madaline Savage, M.D.   REASON FOR CONSULTATION:  Severe native three-vessel coronary disease  and saphenous vein graft disease status post coronary artery bypass  graft surgery in May 1995.   CLINICAL HISTORY:  This patient is a 65 year old gentleman with a long  history of coronary disease, status post myocardial infarction in the  1990s, who underwent coronary bypass surgery by me in 1995.  At that  time he had four grafts with a left internal mammary graft to the LAD, a  saphenous vein graft to the diagonal, a saphenous vein graft to an  obtuse marginal, and a saphenous vein graft to the distal right coronary  artery.  He did well until several weeks ago, when he began developing  chest and upper arm pain which typically occurred after eating but also  was occurring at night time when he was lying down.  He was also having  some pressure radiating through into his back and said it felt like his  back was going to explode.  He took multiple nitroglycerin tablets  without relief and presented to the [facility] emergency room.  He was eventually transferred to [facility].  The total duration of his  chest pain was approximately 2 hours.  His pain was eventually relieved  at [facility] with intravenous nitroglycerin, heparin, and  Integrilin.   He underwent cardiac catheterization here which showed occlusion of his  left main and right coronary artery at the ostium.  There is a patent  left internal mammary graft to the LAD.  The LAD had no significant  stenosis in it.  There was retrograde flow into  a small to moderate size  diagonal branch that did have some ostial stenosis.  The left circumflex  has a single large marginal branch that was supplied by a saphenous vein  graft.  It was diffusely diseased and had 95% distal stenosis in it.  The right coronary artery had a moderate-sized posterior descending and  small posterolateral branches.  There was a patent saphenous vein graft  to the distal right coronary artery that was also diffusely diseased  with 80-95% stenoses in multiple locations.  Left ventricular function  was well-preserved with an ejection fraction of about 50% with no mitral  regurgitation.  There was no gradient across the aortic valve and no  significant mitral regurgitation.   REVIEW OF SYSTEMS:  GENERAL:  He denies any fever or chills.  He has had  no recent weight changes.  He has had some fatigue.  EYES:  Negative.  ENT:  Negative.  ENDOCRINE:  He has a long history of poorly controlled  and difficult to control diabetes.  He has been  treated by multiple  endocrinologists.  He denies hypothyroidism.  CARDIOVASCULAR:  As above.  He has had a several-week history of substernal chest pressure and pain  radiating into his upper arms as well as into his back.  These are  typically related to eating or lying down at night.  He is not very  active.  He denies any PND or orthopnea.  He has had no exertional  dyspnea.  He denies peripheral edema.  RESPIRATORY:  He has had no cough  or sputum production.  GI:  He has had no nausea/vomiting.  He denies  melena and bright red blood per rectum.  He does have history of  dysphagia to solid foods which comes and goes.  This has not been worked  up and is not always present.  GU:  He denies dysuria and hematuria.  MUSCULOSKELETAL:  He has had some weakness of his legs which he relates  to being on Vytorin in the past.  There is some question whether he had  rhabdomyolysis in the past.  NEUROLOGICAL:  He denies any focal  weakness  or numbness.  He denies dizziness and syncope.  HEMATOLOGIC:  He denies  any history of bleeding disorders or easy bleeding.   PAST MEDICAL HISTORY:  Significant for:  1. Coronary disease status post bypass surgery in 1995.  2. He has history of diabetes and 1998, that has been difficult to      control and poorly controlled.  3. He has a history of COPD and hypoxemia followed by Dr. Fannie Knee.      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  4. He has a history of hypertension.  5. Hyperlipidemia.  6. He has a history of dysphasia which has not been worked up yet.   SOCIAL HISTORY:  He is retired and lives with his wife.  He quit smoking  many years ago and denies alcohol abuse.   FAMILY HISTORY:  Strongly positive for coronary disease in his father at  a relatively young age.   ALLERGIES:  1. PENICILLIN.  2. CHOLESTEROL MEDICATIONS.   MEDICATIONS AT TIME OF ADMISSION:  1. Lopressor 50 mg b.i.d.  2. Altace 2.5 mg b.i.d.  3. Zaroxolyn 5 mg daily.  4. Potassium 20 mEq every other day.  5. Lasix 10 mg p.r.n.  6. Magnesium 400 mg every other day.  7. Aspirin 81 mg daily.   PHYSICAL EXAMINATION:  VITAL SIGNS:  His blood pressure was 111/58.  His  pulse is 83 and regular.  Respiratory rate is 22 and unlabored.  GENERAL:  He is a well-developed, obese, white male in no distress.  HEENT:  Shows him to be normocephalic and atraumatic.  Pupils are equal  and reactive to light and accommodation.  Extraocular muscles are  intact.  His throat is clear.  NECK:  Shows normal carotid pulses bilaterally.  There is a cervical  bruit on both sides.  There is no adenopathy or thyromegaly.  CARDIAC:  Shows a regular rate and rhythm with normal S1-S2.  There is no murmur,  rub or gallop.  LUNGS:  Clear.  There is an old, well-healed mediastinotomy incision. ABDOMEN:  Shows active bowel sounds.  His abdomen is soft and  nontender.  It is obese.  There are no palpable masses or organomegaly.  EXTREMITIES:  Shows an old right leg saphenous vein harvest incision  from the ankle to just above the knee.  There is some mild bilateral  ankle edema.  Pedal pulses are palpable bilaterally.  SKIN:  Warm and dry.  NEUROLOGIC:  Shows him to be alert and oriented x3.  Motor and sensory  exams grossly normal.  VASCULAR:  Shows palpable pedal pulses bilaterally.  His radial pulses  are strong bilaterally.   Vascular lab examination shows normal upper extremity arterial flow with  a normal Allen's test bilaterally.  There was a 40-60% right internal  carotid artery stenosis and 60-80% left internal carotid artery  stenosis.  ABI was greater than 1 bilaterally with triphasic wave forms.   His cardiac enzymes showed a peak troponin of 2.2.  His peak CPK was 200  with MB of 15.6.  Electrolytes are normal with a BUN of 9, creatinine of  0.6.  His GFR was greater than 60 and glucose was 277.  White blood cell  count 12.7, hemoglobin 13.9, platelet count 214,000.  Chest x-ray showed  stable atelectasis and/or scar at the left lung base.  The lungs are  otherwise clear. Electrocardiogram showed a normal sinus rhythm with  nonspecific ST and T-wave changes.   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   He has relatively small diabetic type vessels.  I agree that coronary  bypass graft surgery is probably the best long-term treatment for his  coronary disease.  His operative risk is increased due to the redo  nature of his surgery as well as his other comorbid factors.  He did  have a CT angiogram of the carotid arteries to further assess the degree  of stenosis and has been seen by Dr. Darrick Penna of vascular surgery.  The CT  angiogram showed an  80-90% left internal carotid artery stenosis.  After  discussion with Dr. Darrick Penna, it was not felt that he should undergo  concomitant carotid endarterectomy on the left side since he is  asymptomatic and has no significant carotid stenosis on the right side.  This will require followup after his recovery.  I discussed the  operative procedure of coronary artery bypass graft surgery with the  patient and his wife.  We discussed alternatives, benefits, and risks.  We discussed possible use of the left radial artery graft as a conduit  in addition to saphenous vein from his left leg.  I discussed the risks  of surgery including, but not limited to, bleeding, blood transfusion,  infection, stroke, myocardial infarction, graft failure, and death.  He  understands all this and would like to proceed with surgery.  We will  plan to do this Monday.      Evelene Croon, M.D.  Electronically Signed    BB/MEDQ  D:  05/15/2006  T:  05/15/2006  Job:  [id number]   cc:   [facility]

## 2010-09-20 NOTE — H&P (Signed)
NAMEOREL, COOLER NO.:  [id number]   MEDICAL RECORD NO.:  [id number]          PATIENT TYPE:  INP   LOCATION:  2914                         FACILITY:  [facility]   PHYSICIAN:  Ulyses Amor, MD DATE OF BIRTH:  [birth year]   DATE OF ADMISSION:  05/12/2006  DATE OF DISCHARGE:                              HISTORY & PHYSICAL   [name] is a 65 year old white man who was transferred from  [facility] to [facility] for further evaluation of chest  pain.   The patient has a history of coronary artery disease.  In 1995, he  underwent coronary artery bypass surgery.  Some years prior to that, he  had experienced and acute myocardial infarction.  His cardiac course has  been uncomplicated since his coronary artery bypass surgery.  In the  last several weeks, he has experienced several episodes of chest pain,  usually after eating.  He saw [name] who scheduled a cardiac  catheterization for later this week.  However, the patient experienced  an episode of chest pain this evening after eating dinner.  He was  watching television at the time.  The chest pain was described as an  ache in his upper arms bilaterally which progressed to a sharp and  pressure discomfort across his chest.  He also experienced this pressure  discomfort in his back.  It was not associated dyspnea, diaphoresis or  nausea.  There were no exacerbating or ameliorating factors.  It  appeared not to be related to position, activity, meals or respirations.  He took a total of six nitroglycerin tablets which did not relieve his  chest pain.  He presented to the emergency department at [facility] and was subsequently transferred to [facility].  The  total duration of chest pain was approximately two hours.  It was  ultimately relieved at [facility] with the measures implemented  including intravenous nitroglycerin, intravenous heparin and intravenous  Integrilin.  He received morphine upon arrival at [facility].  He is now free of chest pain and otherwise asymptomatic.   The patient has no history of congestive heart failure or arrhythmia.  The patient has a number of risk factor factors for coronary artery  disease including diabetes mellitus and family history (father).  The  patient has no history of dyslipidemia, hypertension or smoking.  The  patient has no other medical problems.   MEDICATIONS:  Metoprolol, Amaryl, metolazone, Altace, aspirin and  nitroglycerin.   ALLERGIES:  1. PENICILLIN.  2. CHOLESTEROL MEDICATIONS.   OPERATION:  Coronary artery bypass surgery.   FAMILY HISTORY:  Notable for coronary artery disease (father).   SOCIAL HISTORY:  The patient lives with his wife.  He does not work.  He  does not smoke cigarettes.  He does not drink alcohol.   REVIEW OF SYSTEMS:  Reveals no new problems related to his head, eyes,  ears, nose, mouth, throat, lungs, gastrointestinal system, genitourinary  system or extremities.  There is no history of neurologic or psychiatric  disorder.  There is no history of fever, chills operating or  weight  loss.   PHYSICAL EXAMINATION:  Blood pressure 103/65, pulse 74 and regular,  respiration 16, pulse ox 97% on two liters. The patient was an obese,  middle-aged white man in no discomfort.  He was alert, oriented,  appropriate and responsive.  Head, eyes, nose, mouth were normal.  The  neck was without thyromegaly or adenopathy.  Carotid pulses were  palpable bilaterally without bruits.  Cardiac examination revealed a  normal S1-S2.  There was no S3, S4, murmur, rub or click.  Cardiac  rhythm was regular.  No chest wall tenderness was noted.  The lungs were  clear. The abdomen was soft and nontender.  There was no mass,  hepatosplenomegaly, bruit, distention, rebound, guarding or rigidity.  Bowel sounds were normal.  Rectal and genital examinations were not  performed as  they were not pertinent to the reason for acute care  hospitalization.  The extremities were without edema, deviation or  deformity.  Radial and dorsalis pedis pulses were palpable bilaterally.  Brief screening neurologic survey was unremarkable.   The electrocardiogram revealed normal sinus rhythm.  There was a  nonspecific ST abnormality.  There were no repolarization abnormalities  specific for ischemia or infarction.  Potassium was 3.6, BUN 22 and  creatinine 1.  Cardiac markers were pending at the time of this  dictation.   IMPRESSION:  1. Chest pain:  Rule out unstable angina.  2. Coronary artery disease, status post myocardial infarction, status      post coronary artery bypass surgery.  3. Diabetes mellitus.   PLAN:  1. Telemetry.  2. Serial cardiac enzymes.  3. Aspirin.  4. Intravenous heparin.  5. Intravenous nitroglycerin.  6. Intravenous Integrilin.  7. Metoprolol.  8. Further measures per [name].      Ulyses Amor, MD  Electronically Signed     MSC/MEDQ  D:  05/12/2006  T:  05/12/2006  Job:  [id number]   cc:   Thereasa Solo. Little, M.D.

## 2010-09-20 NOTE — Cardiovascular Report (Signed)
[name], Ball NO.:  [id number]   MEDICAL RECORD NO.:  [id number]          PATIENT TYPE:  INP   LOCATION:  2914                         FACILITY:  [facility]   PHYSICIAN:  Madaline Savage, M.D.DATE OF BIRTH:  1945-06-08   DATE OF PROCEDURE:  DATE OF DISCHARGE:                            CARDIAC CATHETERIZATION   PROCEDURES PERFORMED:  1. Selective coronary angiography by Judkins technique.  2. Retrograde left heart catheterization.  3. Left ventricular angiography.  4. Selective visualization of multiple saphenous vein grafts.  5. Selective visualization of the left internal mammary artery graft.  6. Right heart catheterization and right heart hemodynamic      determinations.   COMPLICATIONS:  None.   ENTRY SITE:  Right femoral artery and vein.   PATIENT PROFILE:  The patient is a 65 year old cardiology patient of Dr.  Caprice Kluver, who has known coronary disease, who underwent bypass grafting  in 1995.  He presented recently with unstable angina and had trace  positive cardiac enzymes.  He also is diabetic with a hemoglobin A1c of  9.4.  The patient does have chronic lung disease.  He entered the cath  lab and tolerated the procedure with some difficulty because of  discomfort from his back and also from ongoing chest pain during the  case.  No complications occurred.   RESULTS:   PRESSURES:  Left ventricular pressure was 130/5, end-diastolic pressure  18, central aortic pressure 130/70, mean of 95, no aortic valve gradient  by pullback technique.  The right atrial mean pressure was 10, right ventricular pressure 35/3,  end-diastolic pressure 9.  Pulmonary artery pressure 35/10, mean 20.  Pulmonary capillary wedge  mean pressure was 11, A-wave 18, V-wave 20.  No aortic valve gradient by  pullback technique.   CARDIAC OUTPUTS:  Fick 5.9.  Fick cardiac index 2.43.  Thermodilution  cardiac output 6.2.  Thermodilution cardiac index 2.5.  No aortic  valve  gradient by pullback technique.   ANGIOGRAPHIC RESULTS:  The patient has moderately severe calcification  in is left main coronary artery over a 25-to-30-mm length.   Left ventricular angiography which was performed first showed EF of 50%  without wall motion abnormalities detected.  No mitral regurgitation  seen.   Left main coronary artery could never be filled.  It was 100% occluded  at near the ostium.  There was a long area of calcification as was  previously stated, that extended from the aortal ostial origin of the  left main to the left main bifurcation into the circumflex and LAD.  That whole segment was calcified and occluded.   The right coronary artery was a small and apparently nondominant vessel  which was occluded just after a pulmonary conus branch.  There was a  collateral to an acute marginal branch.  There was no filling of the  ongoing RCA and no distal right coronary artery filling   Saphenous vein graft to RCA was diffusely and severely diseased, the  worst of which was an 85% stenosis in the mid body of the graft,  followed by a second area of 75-85% stenosis  approximately 20-mm distal  to the first stenosis.  This entire area was diseased.  The vein graft  then inserted into the distal RCA and that vessel bifurcated distally.  There is a 99% stenosis in the proximal portion of a PLA that is  potentially bypassable.   There was a 100% occluded saphenous vein graft to diagonal that was  aorta ostial occlusion.   There was a saphenous vein graft to a obtuse marginal branch that  contained a stenosis of 90% at the distal anastomotic site into the  ongoing vessel.  The runoff was small and fair.   The internal mammary artery graft to the LAD was widely patent  throughout including the distal anastomotic site.  The distal LAD was a  2-mm vessel but filled widely both distally in an anterior retrograde  fashion.  There was mild collateralization of what  appeared to be the  distal RCA.   FINAL DIAGNOSES:  1. Unstable angina pectoris with trace positive cardiac enzymes.  2. Multiple cardiac risk factors including diabetes with an elevated      hemoglobin A1c.  3. Status post coronary artery bypass graft, 1995.  4. Severe native coronary artery disease and severe bypass graft      disease.      a.     With 100% occlusion of the native left main coronary artery.      b.     With 100% occlusion of the proximal right coronary artery.      c.     Patent left internal mammary artery to left anterior       descending artery.      d.     Occluded saphenous vein graft to diagonal at the aorta       ostial site.      e.     Severe disease in right coronary artery graft to distal       right coronary artery, two sites of stenosis, poor candidate for       intervention.      f.     High-grade stenosis of saphenous vein graft to obtuse       marginal branch of circumflex coronary artery.      g.     Good left ventricular systolic function.   PLAN:  The patient should be considered for repeat coronary bypass  grafting.  A surgical consul will be obtained.           ______________________________  Madaline Savage, M.D.     WHG/MEDQ  D:  05/13/2006  T:  05/13/2006  Job:  [id number]   cc:   CV/[facility] Little, M.D.  [facility] Lab

## 2010-09-20 NOTE — Op Note (Signed)
NAMEMANSUR, Douglas NO.:  [id number]   MEDICAL RECORD NO.:  [id number]          PATIENT TYPE:  INP   LOCATION:  2314                         FACILITY:  [facility]   PHYSICIAN:  Evelene Croon, M.D.     DATE OF BIRTH:  1946/02/02   DATE OF PROCEDURE:  05/18/2006  DATE OF DISCHARGE:                               OPERATIVE REPORT   PREOPERATIVE DIAGNOSIS:  Severe native three-vessel coronary artery  disease and saphenous vein graft disease status post coronary bypass  graft surgery in 1995.   POSTOPERATIVE DIAGNOSIS:  Severe native three-vessel coronary artery  disease and saphenous vein graft disease status post coronary bypass  graft surgery in 1995.   OPERATIVE PROCEDURE:  Median sternotomy, extracorporeal circulation,  redo coronary artery bypass graft surgery x2 using a left radial artery  graft to the obtuse marginal branch of the left circumflex coronary  artery and a saphenous vein graft to the posterior descending branch of  the right coronary artery.  Endoscopic vein harvesting from the left  leg.   ATTENDING SURGEON:  Evelene Croon, M.D.   ASSISTANT:  Shonna Chock, P.A.-C.   ANESTHESIA:  General endotracheal.   CLINICAL HISTORY:  This patient is a 65 year old gentleman with history  of coronary disease status post prior myocardial infarction and status  post coronary bypass surgery x4 by me in 1995.  At that time, he had a  left internal mammary graft to the LAD and vein graft to diagonal,  obtuse marginal, and distal right coronary artery.  He did well until  the past few weeks when he began developing chest and upper arm pain  after meals as well as when lying down.  He was scheduled for elective  cardiac catheterization but was admitted on May 12, 2006 with an  acute coronary syndrome.  He had mildly elevated enzymes.  Cardiac  catheterization showed severe native vessel disease with occlusion of  his left main and right coronary artery at the  ostium.  There was a  patent left internal mammary graft to the LAD.  Saphenous vein grafts to  the obtuse marginal and distal right coronary artery were severely  diseased with some clot present in the distal aspect of the right  coronary artery vein graft.  Left ventricular function was well-  preserved.  After review of the angiogram and examination of the  patient, it was felt that redo coronary bypass graft surgery was the  best treatment.  I discussed the operative procedure with the patient  including alternatives, benefits, and risks including but not limited to  bleeding, blood transfusion, infection, stroke, myocardial infarction,  graft failure, and death.  Preoperative carotid Doppler exam showed a  high-grade left internal carotid artery stenosis.  He was seen in  consultation by vascular surgery and underwent a CT angiogram which  showed 80-90% left internal carotid artery stenosis.  Since he was  asymptomatic with no significant stenosis on the other side, Dr. Darrick Penna  felt that it would be best to plan on follow-up and treatment of his  carotid stenosis in the future after he recovers from  his cardiac  surgery.   OPERATIVE PROCEDURE:  The patient was taken to the operating room and  placed on the table in a supine position.  After induction of general  endotracheal anesthesia, a Foley catheter was placed in bladder using  sterile technique.  Then, the chest, abdomen and both lower extremities  were prepped and draped in usual sterile manner.  Left arm was prepped  and draped out on an arm board.  Then, the left radial artery graft was  harvested as a free graft through a longitudinal incision over the  radial artery.  Before transecting any branches, the artery was clamped  distally with an atraumatic vascular clamp, and good Doppler signal was  confirmed beyond the clamp in the hand.  The rest of the radial artery  was divided using the harmonic scalpel.  The artery was a  large caliber  vessel with excellent pulsation.  The artery was transected proximally  and distally and suture ligated with 2-0 silk suture ligature.  The  artery was flushed with papaverine saline solution.  The wound was  hemostatic.  Then, the wound was closed in layers using continuous 2-0  Vicryl suture for the subcutaneous tissue and a 3-0 Vicryl subcuticular  skin closure.  The sponge, needles and counts were correct according to  the scrub nurse at this stage.  A dry sterile dressing was applied over  the incision.  The arm was wrapped lightly with an Ace wrap and then was  repositioned at the patient's left side by the nursing staff.   At the same time, a segment of greater saphenous vein was harvested from  the left leg using endoscopic vein harvest technique.  This vein was of  medium size and good quality.   Then, the chest was opened through the previous median sternotomy  incision.  Sternal wires were removed.  The sternum was divided using  the oscillating saw.  Sternal edges were retracted and the mediastinal  structures separated from the sternum using electrocautery.  The the  chest retractor was placed.  Dissection was performed to expose the  right atrium and ascending aorta.  The old saphenous vein grafts were  identified and carefully avoided.  Then, the patient was heparinized,  and when an adequate activated clotting time was achieved, the distal  ascending aorta was cannulated using a 22-French aortic cannula for  arterial inflow.  Venous outflow was achieved using a two-stage venous  cannula through the right atrial appendage.  An antegrade cardioplegia  and vent cannula was inserted in the aortic root.  A retrograde  cardioplegic cannula was inserted through the right atrium into the  coronary sinus.   The patient placed on cardiopulmonary bypass, and the remainder of the  heart was dissected from the pericardium.  The left internal mammary artery pedicle  was identified as it entered the pericardium on the left  side and was carefully encircled.  Then, the aorta was crossclamped.  An  atraumatic vascular clamp was placed across the mammary pedicle.  Then,  500 mL of cold blood antegrade cardioplegia was administered in the  aortic root with quick arrest of the heart.  This was followed by 500 mL  of cold blood retrograde cardioplegia.  There was good myocardial  cooling and quick cardiac arrest.  Systemic hypothermia to 28-degrees  centigrade and topical hypothermic iced saline was used.  Additional  doses of retrograde cardioplegia were given at about 20-minute intervals  to maintain myocardial temperature  around 10 degrees centigrade.   Then, the first distal anastomosis was performed to the posterior  descending coronary artery.  The internal diameter of this vessel  proximally was about 1.6 mm.  Conduit used was a segment greater  saphenous vein.  Anastomosis performed in an end-to-side manner using  continuous 7-0 Prolene suture.  Flow was noted through the graft and was  excellent.  The distal right coronary artery beyond the posterior  descending branch as well as the posterolateral branches were small  diffusely diseased vessel and not suitable for grafting.   Then, the second distal anastomosis was performed to the obtuse marginal  branch of the left circumflex coronary artery.  The internal diameter of  this vessel was about 1.6 mm.  Conduit used was the left radial artery  graft, and this was anastomosed in an end-to-side manner using  continuous 8-0 Prolene suture.  Flow was noted through the graft and was  excellent.   With the crossclamp in place, the two proximal anastomoses were  performed.  The proximal anastomosis of the radial artery was performed  to the hood of the old obtuse marginal vein graft which was soft.  This  was performed in an end-to-side manner using continuous 7-0 Prolene  suture.  The proximal  anastomosis of the right coronary artery vein  graft was performed directly to the aorta in an end-to-side manner  continuous 6-0 Prolene suture.  Then, the clamp was removed from the  mammary pedicle.  There was rapid warming of the ventricular septum and  return of spontaneous ventricular fibrillation.  The crossclamp was  removed at a time of 102 minutes, and the patient spontaneously  converted to sinus rhythm.   The proximal and distal anastomoses appeared hemostatic and the lay of  the grafts satisfactory.  Graft markers were placed around the proximal  anastomoses.  Two temporary right ventricular and right atrial pacing  wires were placed and brought through the skin.   When the patient rewarmed to 37 degrees centigrade, he was weaned from  cardiopulmonary bypass on no inotropic agents.  Total bypass time was  139 minutes.  Cardiac function appeared excellent with a cardiac output  of 5.5 liters per minute.  Protamine was given, and the venous and aortic cannulas were removed without difficult difficulty.  Hemostasis  was achieved.  Three chest tubes were placed with two in posterior  pericardium, one in the anterior mediastinum and one the left pleural  space.  The pericardium could not be closed over the heart.  Sternum was  closed with double #6 stainless steel wires.  Fascia was closed with  continuous #1 Vicryl suture.  Subcu tissue was closed with continuous 2-  0 Vicryl and the skin with a 3-0 Vicryl subcuticular closure.  The lower  extremity vein harvest sites were closed in layers in a similar manner.  The sponge, needles and instruments counts were correct according to  scrub nurse.  Dry sterile dressings were applied over the incisions,  around the chest tubes which were hooked to Pleur-Evac suction.  The  patient remained hemodynamically stable and was transported to the SICU  in guarded but stable condition.      Evelene Croon, M.D.  Electronically  Signed     BB/MEDQ  D:  05/18/2006  T:  05/19/2006  Job:  [id number]   cc:   Evelene Croon, M.D.  Thereasa Solo. Little, M.D.  Cardiac Catheterization Lab

## 2010-12-04 DIAGNOSIS — Z87448 Personal history of other diseases of urinary system: Secondary | ICD-10-CM

## 2010-12-04 HISTORY — DX: Personal history of other diseases of urinary system: Z87.448

## 2010-12-30 ENCOUNTER — Inpatient Hospital Stay (HOSPITAL_COMMUNITY)
Admission: AD | Admit: 2010-12-30 | Discharge: 2011-01-01 | DRG: 684 | Disposition: A | Discharge status: Home / Self Care | Payer: Medicare Other | Source: Other Acute Inpatient Hospital | Attending: Cardiology | Admitting: Cardiology

## 2010-12-30 DIAGNOSIS — Z951 Presence of aortocoronary bypass graft: Secondary | ICD-10-CM

## 2010-12-30 DIAGNOSIS — I209 Angina pectoris, unspecified: Secondary | ICD-10-CM | POA: Diagnosis present

## 2010-12-30 DIAGNOSIS — Z794 Long term (current) use of insulin: Secondary | ICD-10-CM

## 2010-12-30 DIAGNOSIS — E119 Type 2 diabetes mellitus without complications: Secondary | ICD-10-CM | POA: Diagnosis present

## 2010-12-30 DIAGNOSIS — R609 Edema, unspecified: Secondary | ICD-10-CM | POA: Diagnosis present

## 2010-12-30 DIAGNOSIS — R040 Epistaxis: Secondary | ICD-10-CM | POA: Diagnosis present

## 2010-12-30 DIAGNOSIS — I10 Essential (primary) hypertension: Secondary | ICD-10-CM | POA: Diagnosis present

## 2010-12-30 DIAGNOSIS — E860 Dehydration: Secondary | ICD-10-CM | POA: Diagnosis present

## 2010-12-30 DIAGNOSIS — I959 Hypotension, unspecified: Secondary | ICD-10-CM | POA: Diagnosis present

## 2010-12-30 DIAGNOSIS — I519 Heart disease, unspecified: Secondary | ICD-10-CM | POA: Diagnosis present

## 2010-12-30 DIAGNOSIS — T50995A Adverse effect of other drugs, medicaments and biological substances, initial encounter: Secondary | ICD-10-CM | POA: Diagnosis present

## 2010-12-30 DIAGNOSIS — Z7982 Long term (current) use of aspirin: Secondary | ICD-10-CM

## 2010-12-30 DIAGNOSIS — Z7902 Long term (current) use of antithrombotics/antiplatelets: Secondary | ICD-10-CM

## 2010-12-30 DIAGNOSIS — N179 Acute kidney failure, unspecified: Principal | ICD-10-CM | POA: Diagnosis present

## 2010-12-30 DIAGNOSIS — I2510 Atherosclerotic heart disease of native coronary artery without angina pectoris: Secondary | ICD-10-CM | POA: Diagnosis present

## 2010-12-30 LAB — GLUCOSE, CAPILLARY: Glucose-Capillary: 229 mg/dL — ABNORMAL HIGH (ref 70–99)

## 2010-12-31 LAB — DIFFERENTIAL
Basophils Absolute: 0.1 10*3/uL (ref 0.0–0.1)
Basophils Relative: 1 % (ref 0–1)
Eosinophils Absolute: 0.4 10*3/uL (ref 0.0–0.7)
Eosinophils Relative: 4 % (ref 0–5)
Lymphocytes Relative: 21 % (ref 12–46)
Monocytes Absolute: 1 10*3/uL (ref 0.1–1.0)

## 2010-12-31 LAB — BASIC METABOLIC PANEL
BUN: 23 mg/dL (ref 6–23)
GFR calc Af Amer: >60 mL/min (ref >60)
GFR calc non Af Amer: >60 mL/min (ref >60)
Potassium: 4.2 mEq/L (ref 3.5–5.1)

## 2010-12-31 LAB — CBC
HCT: 38 % — ABNORMAL LOW (ref 39.0–52.0)
MCHC: 33.2 g/dL (ref 30.0–36.0)
Platelets: 293 10*3/uL (ref 150–400)
RDW: 14.1 % (ref 11.5–15.5)

## 2010-12-31 LAB — GLUCOSE, CAPILLARY
Glucose-Capillary: 162 mg/dL — ABNORMAL HIGH (ref 70–99)
Glucose-Capillary: 178 mg/dL — ABNORMAL HIGH (ref 70–99)
Glucose-Capillary: 240 mg/dL — ABNORMAL HIGH (ref 70–99)

## 2011-01-01 LAB — GLUCOSE, CAPILLARY: Glucose-Capillary: 227 mg/dL — ABNORMAL HIGH (ref 70–99)

## 2011-01-01 LAB — BASIC METABOLIC PANEL
CO2: 27 mEq/L (ref 19–32)
Chloride: 93 mEq/L — ABNORMAL LOW (ref 96–112)
Creatinine, Ser: 1.04 mg/dL (ref 0.50–1.35)
Sodium: 135 mEq/L (ref 135–145)

## 2011-01-02 LAB — GLUCOSE, CAPILLARY: Glucose-Capillary: 242 mg/dL — ABNORMAL HIGH (ref 70–99)

## 2011-01-05 NOTE — Discharge Summary (Signed)
NAMEJERMIAH, [name] NO.:  [id number]  MEDICAL RECORD NO.:  [id number]  LOCATION:  3708                         FACILITY:  [facility]  PHYSICIAN:  [name], M.D. DATE OF BIRTH:  Nov 14, 1945  DATE OF ADMISSION:  12/30/2010 DATE OF DISCHARGE:  01/01/2011                              DISCHARGE SUMMARY   DISCHARGE DIAGNOSES: 1. Acute renal failure secondary to contrast nephropathy in setting of     sepsis and dehydration resolved, holding lisinopril for now. 2. Hypotension resolved. 3. Diabetes mellitus, insulin dependent, poorly controlled. 4. Chronic edema, stable. 5. Coronary artery disease with history of bypass grafting and redo     and has been turned down for the second redo.  Medical therapy     only. 6. Grade 3 diastolic dysfunction.  DISCHARGE CONDITION:  Improved.  PROCEDURES:  None.  DISCHARGE MEDICATIONS:  See medication reconciliation sheet.  DISCHARGE INSTRUCTIONS: 1. Increase activity slowly.  May shower. 2. Low-sodium heart-healthy diabetic diet. 3. Follow up with [name] on January 10, 2011, at 2:40 p.m.  HOSPITAL COURSE:  A 65 year old white married male well-known to Dr. [name] secondary to coronary artery disease significant and multiple medical issues.  History of Bypass grafting as well as redo bypass and since that time stents to the vein graft, to the OM in April 2011 and in November 2011 he had stents to the vein graft to the OM and the distal portion.  He had been doing well.  He saw [name] a few weeks ago.  On Tuesday prior to this admission which was approximately a week ago, he awoke with right knee and ankle pain.  He went to the emergency room at [facility], was diagnosed with cellulitis, was given an antibiotic and they recommended to be admitted.  He signed out [facility] on Saturday December 28, 2010.  He was not doing well at home with hypotension.  They called EMS and he presented to [facility] as the EMS would  not bring him to [facility] secondary to hypotension, dehydration, acute renal failure, and a fever of 103 per the patient.  He also had on August 21 when he was seen in the ER a CT with contrast.  There is concern he had postcontrast nephropathy causing acute renal failure with sepsis and dehydration.  He was admitted to [facility] and hydrated.  ACE inhibitors and diuretics were held.  Then on August 27, he received Lovenox injection and developed a nosebleed that lasted all day.  The wife became very upset and asked him to be transferred to [facility].  On arrival to [facility], he was chest pain free.  No shortness of breath. His lower extremity edema was somewhat worse than usual and he had some chest pain earlier in the day but once he was back on his regular meds he felt fine.  No further pain.  He was admitted to telemetry.  He refuses any Lovenox or heparin and compression stockings for DVT prophylaxis.  We asked to walk around.  The next morning he was pain free.  His hypotension was resolved, his acute renal failure secondary to contrast media and hypertension was improving with chronic lower extremity edema was  stable and he was pain free.  Home medications were restarted except for lisinopril.  By August 29, he was stable and ready for discharge home.  He could ambulate in the hall and he denied any acute shortness of breath.  LABORATORY DATA:  Hemoglobin on admission 12.6, hematocrit 38, WBC 10.7, platelets 293, neutrophils 66, lymphs 121, mono 90, eos 4, baso 1. Glucose ranged anywhere from 162 to 240.  Sodium at discharge 135, potassium 4.1, chloride 93, CO2 27, glucose 230, BUN 27, creatinine 1.04, calcium 10.1.  Blood pressure was 130/72, pulse 70, respiratory rate is 18, temperature 97.7, oxygen saturation on room air was 95%.  Heart regular rate and rhythm.  Lungs were clear.  Abdomen obese, soft, nontender.  Positive bowel sounds.  Extremities with bilateral edema but stable.  He  will follow up with [name] as instructed.     Darcella Gasman. [place], N.P.   ______________________________ Douglas Solo Diania Co, M.D.    LRI/MEDQ  D:  01/01/2011  T:  01/01/2011  Job:  [id number]  Electronically Signed by Nada Boozer N.P. on 01/03/2011 10:34:32 AM Electronically Signed by [name] M.D. on 01/05/2011 02:31:48 PM

## 2011-01-05 NOTE — H&P (Signed)
[name], BALEY NO.:  [id number]  MEDICAL RECORD NO.:  [id number]  LOCATION:  3708                         FACILITY:  [facility]  PHYSICIAN:  Thereasa Solo. Konner Saiz, M.D. DATE OF BIRTH:  [birth year]  DATE OF ADMISSION:  12/30/2010 DATE OF DISCHARGE:                             HISTORY & PHYSICAL   HISTORY OF PRESENT ILLNESS:  [name] is a 65 year old male well- known to [name] with multiple medical problems.  He had bypass grafting in 1995 with redo bypass grafting in 2008.  He has essentially lost all of his vein grafts.  He has been turned down for redo bypassing.  He had a stent to the vein graft to the OM in April 2011 and in November 2011 he had a stent to the vein graft to the OM in the distal portion.  He was admitted in January with both lower extremity edema and diuresed.  He has actually been doing pretty well from an anginal standpoint.  He says he saw [name] a couple weeks ago and was doing well.  His current problem started on Tuesday prior to this admission when he woke up with some right knee and ankle pain.  He presented to the Emergency Room at [facility] and was diagnosed with cellulitis.  He was given an antibiotic and it was suggested that he be admitted, but he signed out AMA.  Saturday after this he presented with profound hypotension, dehydration, and acute renal insufficiency.  He says he also had a fever of 103.  When he was seen on December 24, 2010, he did receive a CT with contrast and there was concern that he had post contrast nephropathy in the setting of sepsis and dehydration.  Previously, his creatinine has been normal with a creatinine of 1.01 in January 2012.  The patient was admitted to [facility] and hydrated.  His medications were adjusted, his ACE inhibitor and diuretics were held.  Today, he received a Lovenox injection and then developed a nosebleed that lasted all day.  He and his wife became  concerned and requested the doctors at [facility] transfer to [facility] for further evaluation and to be under the care of Dr. [name].  Currently, he is chest pain free and he is not short of breath.  He says his lower extremity edema is now worse than usual.  He did have some angina earlier in the day, but the staff resumed his metoprolol and Imdur and this is better.  He is admitted now for further evaluation.  HOME MEDICATIONS:  As follows: 1. Lisinopril 5 mg nightly. 2. Imdur 15 mg at 9 a.m., 3 p.m., 6 p.m., and 1 a.m. 3. Metoprolol 50 mg at 9 a.m., 6 p.m., and 1 a.m. 4. Potassium 20 mEq Monday, Wednesday, and Friday and 40 mEq on other     days. 5. Plavix 75 mg at bedtime. 6. Aspirin 81 mg a day. 7. Magnesium 250 mg b.i.d. 8. CoQ10 with sliding scale insulin. 9. He has been on torsemide 50 mg a day, but the patient admits he had     not taken this 4 days prior to his admission on December 24, 2010.     He takes to [facility] p.r.n. if doubling up on his torsemide does     not improve his edema.  ALLERGIES:  PENICILLIN.  He is also intolerant to her RANEXA and STATINS.  SOCIAL HISTORY:  He is married and he has 4 children.  He is a nonsmoker.  FAMILY HISTORY:  Unremarkable.  REVIEW OF SYSTEMS:  Essentially unremarkable except for noted above.  He has chronic lower extremity edema, currently says this is really no worse than usual.  He has had gout in the past.  He gas had previous appendectomy and tonsillectomy.  PHYSICAL EXAMINATION:  VITAL SIGNS:  Blood pressure 126/66, pulse 70, temperature 98.7, O2 sat is 95% on room air, his weight is 126.8 kg. GENERAL:  He is a well-developed, overweight male in no acute distress. HEENT:  Normocephalic.  Extraocular movements are intact.  Sclerae anicteric. NECK:  Without JVD or bruit. CHEST:  Clear to auscultation and percussion. CARDIAC:  Regular rate and rhythm without murmur, rub, or gallop. Normal S1 and S2. ABDOMEN:   Nontender, nondistended. EXTREMITIES:  Two to three plus bilateral lower extremity chronic venous changes.  Both lower extremities are warm to touch. NEURO:  Grossly intact.  He is awake, alert and oriented, and cooperative.  Moves all extremities without obvious deficit. SKIN:  Otherwise cool and dry.  IMPRESSION: 1. Acute renal failure, probably secondary to CT contrast nephropathy     in the setting of sepsis and dehydration. 2. Known coronary artery disease as described above with coronary     artery bypass grafting in 1995 with redo in 2008 and two     interventions to the SVG to OM in 2011, the patient has been turned     down for redo surgery. 3. Type 2 insulin-dependent diabetes. 4. Treated hypertension. 5. Chronic lower extremity edema with recent possible cellulitis and     sepsis, although I have no blood culture results from [facility]. 6. History of good left ventricular function, his last echocardiogram     was in March 2011 and his EF was 50% to 55%.  There was grade 3     diastolic dysfunction. 7. Past history of gout. 8. Epistaxis, possibly secondary to oxygen and subcu Lovenox.  PLAN:  [name] was admitted to telemetry.  We will hold off on Lovenox or heparin subcu for DVT prophylaxis and refuses compression stockings.  We will discuss with [name] when we should resume his diuretics.  For now, we will continue to hold his ACE inhibitor, but continue his antianginals.  We will check a followup BMP in the morning.     Abelino Derrick, P.A.   ______________________________ Thereasa Solo. Clarabell Matsuoka, M.D.   Lenard Lance  D:  12/30/2010  T:  12/30/2010  Job:  [id number]  Electronically Signed by Corine Shelter P.A. on 12/31/2010 04:43:35 PM Electronically Signed by [name] M.D. on 01/05/2011 02:31:38 PM

## 2011-02-20 LAB — CBC
HCT: 38.8 — ABNORMAL LOW
Hemoglobin: 12.1 — ABNORMAL LOW
Hemoglobin: 13
MCV: 81.7
Platelets: 219
RBC: 4.29
RBC: 4.68
RBC: 4.68
RDW: 16.2 — ABNORMAL HIGH
WBC: 11.4 — ABNORMAL HIGH

## 2011-02-20 LAB — COMPREHENSIVE METABOLIC PANEL
ALT: 18
Alkaline Phosphatase: 85
BUN: 20
CO2: 30
Chloride: 97
GFR calc non Af Amer: >60
Glucose, Bld: 191 — ABNORMAL HIGH
Potassium: 4.3
Sodium: 135
Total Bilirubin: 0.7
Total Protein: 6.7

## 2011-02-20 LAB — HEPARIN LEVEL (UNFRACTIONATED)
Heparin Unfractionated: 0.36
Heparin Unfractionated: 0.56

## 2011-06-06 DIAGNOSIS — I25810 Atherosclerosis of coronary artery bypass graft(s) without angina pectoris: Secondary | ICD-10-CM

## 2011-06-06 HISTORY — DX: Atherosclerosis of coronary artery bypass graft(s) without angina pectoris: I25.810

## 2011-06-19 ENCOUNTER — Other Ambulatory Visit: Payer: Self-pay | Admitting: Cardiology

## 2011-06-19 ENCOUNTER — Encounter (HOSPITAL_COMMUNITY): Payer: Self-pay | Admitting: Pharmacy Technician

## 2011-06-20 ENCOUNTER — Other Ambulatory Visit: Payer: Self-pay | Admitting: Cardiology

## 2011-06-25 ENCOUNTER — Encounter (HOSPITAL_COMMUNITY)
Admission: RE | Disposition: A | Discharge status: Home / Self Care | Payer: Self-pay | Source: Ambulatory Visit | Attending: Cardiology

## 2011-06-25 ENCOUNTER — Ambulatory Visit (HOSPITAL_COMMUNITY)
Admission: RE | Admit: 2011-06-25 | Discharge: 2011-06-26 | Disposition: A | Discharge status: Home / Self Care | Payer: Medicare Other | Source: Ambulatory Visit | Attending: Cardiology | Admitting: Cardiology

## 2011-06-25 ENCOUNTER — Encounter (HOSPITAL_COMMUNITY): Payer: Self-pay | Admitting: General Practice

## 2011-06-25 ENCOUNTER — Other Ambulatory Visit: Payer: Self-pay

## 2011-06-25 ENCOUNTER — Ambulatory Visit (HOSPITAL_COMMUNITY): Payer: Medicare Other

## 2011-06-25 DIAGNOSIS — I5189 Other ill-defined heart diseases: Secondary | ICD-10-CM | POA: Diagnosis present

## 2011-06-25 DIAGNOSIS — Y831 Surgical operation with implant of artificial internal device as the cause of abnormal reaction of the patient, or of later complication, without mention of misadventure at the time of the procedure: Secondary | ICD-10-CM | POA: Insufficient documentation

## 2011-06-25 DIAGNOSIS — I129 Hypertensive chronic kidney disease with stage 1 through stage 4 chronic kidney disease, or unspecified chronic kidney disease: Secondary | ICD-10-CM | POA: Insufficient documentation

## 2011-06-25 DIAGNOSIS — N1411 Contrast-induced nephropathy: Secondary | ICD-10-CM

## 2011-06-25 DIAGNOSIS — I272 Pulmonary hypertension, unspecified: Secondary | ICD-10-CM | POA: Diagnosis present

## 2011-06-25 DIAGNOSIS — N141 Nephropathy induced by other drugs, medicaments and biological substances: Secondary | ICD-10-CM

## 2011-06-25 DIAGNOSIS — I2510 Atherosclerotic heart disease of native coronary artery without angina pectoris: Secondary | ICD-10-CM | POA: Insufficient documentation

## 2011-06-25 DIAGNOSIS — E785 Hyperlipidemia, unspecified: Secondary | ICD-10-CM | POA: Diagnosis present

## 2011-06-25 DIAGNOSIS — I2789 Other specified pulmonary heart diseases: Secondary | ICD-10-CM | POA: Insufficient documentation

## 2011-06-25 DIAGNOSIS — Z955 Presence of coronary angioplasty implant and graft: Secondary | ICD-10-CM

## 2011-06-25 DIAGNOSIS — I83009 Varicose veins of unspecified lower extremity with ulcer of unspecified site: Secondary | ICD-10-CM | POA: Diagnosis present

## 2011-06-25 DIAGNOSIS — I200 Unstable angina: Secondary | ICD-10-CM | POA: Insufficient documentation

## 2011-06-25 DIAGNOSIS — I10 Essential (primary) hypertension: Secondary | ICD-10-CM | POA: Diagnosis present

## 2011-06-25 DIAGNOSIS — I208 Other forms of angina pectoris: Secondary | ICD-10-CM | POA: Diagnosis present

## 2011-06-25 DIAGNOSIS — T82897A Other specified complication of cardiac prosthetic devices, implants and grafts, initial encounter: Secondary | ICD-10-CM | POA: Insufficient documentation

## 2011-06-25 DIAGNOSIS — N181 Chronic kidney disease, stage 1: Secondary | ICD-10-CM | POA: Diagnosis present

## 2011-06-25 DIAGNOSIS — I2089 Other forms of angina pectoris: Secondary | ICD-10-CM | POA: Diagnosis present

## 2011-06-25 DIAGNOSIS — N182 Chronic kidney disease, stage 2 (mild): Secondary | ICD-10-CM

## 2011-06-25 DIAGNOSIS — M170 Bilateral primary osteoarthritis of knee: Secondary | ICD-10-CM | POA: Diagnosis present

## 2011-06-25 DIAGNOSIS — R609 Edema, unspecified: Secondary | ICD-10-CM | POA: Insufficient documentation

## 2011-06-25 DIAGNOSIS — I2582 Chronic total occlusion of coronary artery: Secondary | ICD-10-CM | POA: Insufficient documentation

## 2011-06-25 DIAGNOSIS — M1710 Unilateral primary osteoarthritis, unspecified knee: Secondary | ICD-10-CM | POA: Insufficient documentation

## 2011-06-25 DIAGNOSIS — T508X5A Adverse effect of diagnostic agents, initial encounter: Secondary | ICD-10-CM | POA: Diagnosis not present

## 2011-06-25 DIAGNOSIS — I25810 Atherosclerosis of coronary artery bypass graft(s) without angina pectoris: Secondary | ICD-10-CM | POA: Diagnosis present

## 2011-06-25 DIAGNOSIS — E119 Type 2 diabetes mellitus without complications: Secondary | ICD-10-CM | POA: Diagnosis present

## 2011-06-25 HISTORY — PX: CORONARY ANGIOPLASTY WITH STENT PLACEMENT: SHX49

## 2011-06-25 HISTORY — DX: Pulmonary hypertension, unspecified: I27.20

## 2011-06-25 HISTORY — DX: Chronic kidney disease, stage 2 (mild): N18.2

## 2011-06-25 HISTORY — DX: Hyperlipidemia, unspecified: E78.5

## 2011-06-25 HISTORY — DX: Nephropathy induced by other drugs, medicaments and biological substances: N14.1

## 2011-06-25 HISTORY — DX: Adverse effect of diagnostic agents, initial encounter: T50.8X5A

## 2011-06-25 HISTORY — DX: Acute myocardial infarction, unspecified: I21.9

## 2011-06-25 HISTORY — DX: Bilateral primary osteoarthritis of knee: M17.0

## 2011-06-25 HISTORY — PX: LEFT HEART CATHETERIZATION WITH CORONARY/GRAFT ANGIOGRAM: SHX5450

## 2011-06-25 HISTORY — DX: Contrast-induced nephropathy: N14.11

## 2011-06-25 HISTORY — DX: Gastro-esophageal reflux disease without esophagitis: K21.9

## 2011-06-25 HISTORY — DX: Essential (primary) hypertension: I10

## 2011-06-25 HISTORY — DX: Shortness of breath: R06.02

## 2011-06-25 LAB — BASIC METABOLIC PANEL
BUN: 21 mg/dL (ref 6–23)
Calcium: 9.4 mg/dL (ref 8.4–10.5)
Chloride: 102 mEq/L (ref 96–112)
Creatinine, Ser: 0.96 mg/dL (ref 0.50–1.35)
GFR calc Af Amer: >90 mL/min (ref >90)
GFR calc non Af Amer: 84 mL/min — ABNORMAL LOW (ref >90)

## 2011-06-25 LAB — CBC
HCT: 42.9 % (ref 39.0–52.0)
MCHC: 32.4 g/dL (ref 30.0–36.0)
Platelets: 201 10*3/uL (ref 150–400)
RDW: 14.8 % (ref 11.5–15.5)
WBC: 10.1 10*3/uL (ref 4.0–10.5)

## 2011-06-25 LAB — PROTIME-INR
INR: 1.04 (ref 0.00–1.49)
Prothrombin Time: 13.8 seconds (ref 11.6–15.2)

## 2011-06-25 LAB — GLUCOSE, CAPILLARY: Glucose-Capillary: 163 mg/dL — ABNORMAL HIGH (ref 70–99)

## 2011-06-25 LAB — PLATELET INHIBITION P2Y12

## 2011-06-25 LAB — POCT ACTIVATED CLOTTING TIME: Activated Clotting Time: 402 seconds

## 2011-06-25 SURGERY — LEFT HEART CATHETERIZATION WITH CORONARY/GRAFT ANGIOGRAM

## 2011-06-25 MED ORDER — MAGNESIUM OXIDE 400 MG PO TABS
200.0000 mg | ORAL_TABLET | Freq: Every day | ORAL | Status: DC
  Administered 2011-06-26: 200 mg via ORAL
  Filled 2011-06-25: qty 0.5

## 2011-06-25 MED ORDER — MIDAZOLAM HCL 2 MG/2ML IJ SOLN
INTRAMUSCULAR | Status: AC
  Filled 2011-06-25: qty 2

## 2011-06-25 MED ORDER — SODIUM CHLORIDE 0.9 % IV SOLN
1.0000 mL/kg/h | INTRAVENOUS | Status: AC
  Administered 2011-06-25: 1 mL/kg/h via INTRAVENOUS

## 2011-06-25 MED ORDER — METOPROLOL TARTRATE 50 MG PO TABS
50.0000 mg | ORAL_TABLET | Freq: Three times a day (TID) | ORAL | Status: DC
  Administered 2011-06-25 – 2011-06-26 (×3): 50 mg via ORAL
  Filled 2011-06-25 (×7): qty 1

## 2011-06-25 MED ORDER — ONDANSETRON HCL 4 MG/2ML IJ SOLN: 4.0000 mg | Freq: Four times a day (QID) | INTRAMUSCULAR | Status: DC | PRN

## 2011-06-25 MED ORDER — TORSEMIDE 100 MG PO TABS
100.0000 mg | ORAL_TABLET | Freq: Every day | ORAL | Status: DC
  Filled 2011-06-25: qty 1

## 2011-06-25 MED ORDER — MORPHINE SULFATE 2 MG/ML IJ SOLN: 2.0000 mg | INTRAMUSCULAR | Status: DC | PRN

## 2011-06-25 MED ORDER — SODIUM CHLORIDE 0.9 % IJ SOLN: 3.0000 mL | INTRAMUSCULAR | Status: DC | PRN

## 2011-06-25 MED ORDER — DIAZEPAM 5 MG PO TABS
10.0000 mg | ORAL_TABLET | ORAL | Status: AC
  Administered 2011-06-25: 10 mg via ORAL
  Filled 2011-06-25: qty 2

## 2011-06-25 MED ORDER — VITAMIN D (ERGOCALCIFEROL) 1.25 MG (50000 UNIT) PO CAPS: 50000.0000 [IU] | ORAL_CAPSULE | ORAL | Status: DC

## 2011-06-25 MED ORDER — POTASSIUM CHLORIDE CRYS ER 20 MEQ PO TBCR
40.0000 meq | EXTENDED_RELEASE_TABLET | Freq: Every day | ORAL | Status: DC
  Administered 2011-06-26: 40 meq via ORAL
  Filled 2011-06-25: qty 2

## 2011-06-25 MED ORDER — LIDOCAINE HCL (PF) 1 % IJ SOLN
INTRAMUSCULAR | Status: AC
  Filled 2011-06-25: qty 30

## 2011-06-25 MED ORDER — INSULIN NPH (HUMAN) (ISOPHANE) 100 UNIT/ML ~~LOC~~ SUSP
30.0000 [IU] | Freq: Three times a day (TID) | SUBCUTANEOUS | Status: DC
  Administered 2011-06-25: 20 [IU] via SUBCUTANEOUS
  Administered 2011-06-26: 30 [IU] via SUBCUTANEOUS
  Filled 2011-06-25: qty 10

## 2011-06-25 MED ORDER — CLOPIDOGREL BISULFATE 300 MG PO TABS
ORAL_TABLET | ORAL | Status: AC
  Filled 2011-06-25: qty 1

## 2011-06-25 MED ORDER — FAMOTIDINE 20 MG PO TABS
20.0000 mg | ORAL_TABLET | Freq: Every day | ORAL | Status: DC
  Filled 2011-06-25: qty 1

## 2011-06-25 MED ORDER — NITROGLYCERIN 0.4 MG SL SUBL: 0.4000 mg | SUBLINGUAL_TABLET | SUBLINGUAL | Status: DC | PRN

## 2011-06-25 MED ORDER — LISINOPRIL 5 MG PO TABS
5.0000 mg | ORAL_TABLET | Freq: Every day | ORAL | Status: DC
  Administered 2011-06-26: 5 mg via ORAL
  Filled 2011-06-25 (×2): qty 1

## 2011-06-25 MED ORDER — ACETAMINOPHEN 325 MG PO TABS: 650.0000 mg | ORAL_TABLET | ORAL | Status: DC | PRN

## 2011-06-25 MED ORDER — ASPIRIN EC 81 MG PO TBEC
81.0000 mg | DELAYED_RELEASE_TABLET | Freq: Every day | ORAL | Status: DC
  Administered 2011-06-26: 81 mg via ORAL
  Filled 2011-06-25: qty 1

## 2011-06-25 MED ORDER — CLOPIDOGREL BISULFATE 75 MG PO TABS
75.0000 mg | ORAL_TABLET | Freq: Every day | ORAL | Status: DC
  Filled 2011-06-25: qty 1

## 2011-06-25 MED ORDER — SODIUM CHLORIDE 0.9 % IV SOLN: 250.0000 mL | INTRAVENOUS | Status: DC

## 2011-06-25 MED ORDER — HEPARIN (PORCINE) IN NACL 2-0.9 UNIT/ML-% IJ SOLN
INTRAMUSCULAR | Status: AC
  Filled 2011-06-25: qty 2000

## 2011-06-25 MED ORDER — ISOSORBIDE MONONITRATE ER 30 MG PO TB24
30.0000 mg | ORAL_TABLET | Freq: Three times a day (TID) | ORAL | Status: DC
Start: 2011-06-25 — End: 2011-06-25

## 2011-06-25 MED ORDER — SODIUM CHLORIDE 0.9 % IV SOLN
INTRAVENOUS | Status: DC
  Administered 2011-06-25: 10:00:00 via INTRAVENOUS

## 2011-06-25 MED ORDER — FENTANYL CITRATE 0.05 MG/ML IJ SOLN
INTRAMUSCULAR | Status: AC
  Filled 2011-06-25: qty 2

## 2011-06-25 MED ORDER — MAGNESIUM OXIDE 250 MG PO TABS: 1.0000 | ORAL_TABLET | Freq: Every day | ORAL | Status: DC

## 2011-06-25 MED ORDER — ISOSORBIDE DINITRATE 30 MG PO TABS
30.0000 mg | ORAL_TABLET | Freq: Three times a day (TID) | ORAL | Status: DC
  Administered 2011-06-25 – 2011-06-26 (×3): 30 mg via ORAL
  Filled 2011-06-25 (×6): qty 1

## 2011-06-25 MED ORDER — BIVALIRUDIN 250 MG IV SOLR
INTRAVENOUS | Status: AC
  Filled 2011-06-25: qty 250

## 2011-06-25 MED ORDER — SODIUM CHLORIDE 0.9 % IJ SOLN: 3.0000 mL | Freq: Two times a day (BID) | INTRAMUSCULAR | Status: DC

## 2011-06-25 MED ORDER — NITROGLYCERIN 0.2 MG/ML ON CALL CATH LAB
INTRAVENOUS | Status: AC
  Filled 2011-06-25: qty 1

## 2011-06-25 NOTE — Progress Notes (Signed)
Site area: right groin  Site Prior to Removal:  Level 0  Pressure Applied For 20 MINUTES    Minutes Beginning at 1505  Manual:   yes  Patient Status During Pull:  stable  Post Pull Groin Site:  Level 0  Post Pull Instructions Given:  yes  Post Pull Pulses Present:  yes  Dressing Applied:  yes  Comments:  Applied gauze secured with medipore tape

## 2011-06-25 NOTE — Plan of Care (Signed)
Problem: Phase II Progression Outcomes Goal: Vascular site scale level 0 - I Vascular Site Scale Level 0: No bruising/bleeding/hematoma Level I (Mild): Bruising/Ecchymosis, minimal bleeding/ooozing, palpable hematoma < 3 cm Level II (Moderate): Bleeding not affecting hemodynamic parameters, pseudoaneurysm, palpable hematoma > 3 cm Outcome: Completed/Met Date Met:  06/25/11 Level 0 right femerol Goal: No post PCI angina Outcome: Completed/Met Date Met:  06/25/11 Angina resolved with oxygen 3 liters Goal: Distal pulses equal baseline assessment Outcome: Completed/Met Date Met:  06/25/11 Pulses present and palpable lower extremities Goal: Discharge plan established Outcome: Completed/Met Date Met:  06/25/11 Planned discharge tommorrow Goal: Tolerating diet Outcome: Completed/Met Date Met:  06/25/11 Tolerated lunch and dinner

## 2011-06-25 NOTE — Op Note (Signed)
[facility]     CARDIAC CATHETERIZATION REPORT  NAME:  [name]     MRN: [id number] DATE OF BIRTH:  Dec 21, 1945   ADMIT DATE:  06/25/2011  Performing Cardiologist: Marykay Lex, M.D., M.S. Primary Cardiologist: Judene Companion, MD, MD  Procedures Performed:  Left Heart Catheterization via 5 Fr Right Common Femoral Artery access  Left Ventriculography, RAO 15 mL hand-injection   Native Coronary Angiography  Internal Mammary Graft Angiography  Saphenous Vein Graft Angiography  IC NTG Injection  Percutaneous Coronary Artery Intervention on distal SVG to OM graft with a 2.75 mm x 12 mm Promus Element DES; final diameter 2.85 mm  Indication(s): Progressively worsening angina  History: 66 y.o. male with a past medical history of coronary bypass grafting in 1995 following redo CABG in 2008 --as well as multiple interventions on native coronary arteries and grafts. Unfortunately all the new grafts as well as native coronary arteries are occluded. He is now undergone intervention on the SVG to OM twice with his last time being angry count dated distal and mid intervention in November of 2011.  He has been doing relatively well with exception of some of his significant chronic edema, heart rate symptoms until recently when doing so [name] in clinic on February 3 13th of this year when he started noticing that he been having exertional chest discomfort postprandial chest discomfort comparable to his angina these had previously. Skin relieved with nitroglycerin he is now wearing home oxygen has also seemed to help. He notes that this chest discomfort lingers on them for several hours and occasionally will continue up until he goes to sleep at night. He has not really had that much in the way of any exertion to determine if it's exertional or not. Based on the concerning nature of his anginal pain in his known disease, [name] referred him for diagnostic  cardiac catheterizations plus or minus PCI.  Consent:   Risks of procedure as well as the alternatives and risks of each were explained to the (patient/caregiver).  Consent for procedure obtained.  Consent for signed by MD and patient with RN witness -- placed on chart.  Procedure: The patient was brought to the 2nd Floor [facility] in the fasting state and prepped and draped in the usual sterile fashion for Right groin access. Sterile technique was used including antiseptics, cap, gloves, gown, hand hygiene, mask and sheet.  Skin prep: Chlorhexidine;  Time Out: Verified patient identification, verified procedure, site/side was marked, verified correct patient position, special equipment/implants available, medications/allergies/relevent history reviewed, required imaging and test results available.  Performed  The right femoral head was identified using tactile and fluoroscopic technique.  The right groin was anesthetized with 1% subcutaneous Lidocaine.  The right Common Femoral Artery was accessed using the Modified Seldinger Technique under direct fluoroscopic guidance with placement of a antimicrobial bonded/coated single lumen (5 Fr) sheath.  The sheath was aspirated and flushed.    A 5 Fr JL 4 followed by a JR 4 Catheter was advanced of over a Standard J wire into the ascending Aorta.  These catheters were used to engage the left and right coronary systems, and multiple cineangiographic views of the both Native Coronary Artery system(s) were performed.  The left main was noted to be monitored occluded as was the RCA midportion. The JR 4 catheter was then used to engage the SVG to the OM there was noted to be patent and multiple  cineangiographic views of this sequential vein graft to OM 3 and 2 were obtained. The JR 4 cath was then redirected into the left sublabial artery and exchanged over a long exchange wire for an IMA catheter were that was used to engage the Left  Internal Mammary Artery Graft to the LAD. Multiple cineangiographic views of this graft were performed.  At the completion of the PCI, the guide cath was exchanged over a wire for a 5 French angled pigtail catheter that was used to cross aortic valve.  LV hemodynamics were measured, and hand-injection Left Ventriculography was performed.  LV hemodynamics were then re-sampled, and the catheter was pulled back across the Aortic Valve for measurement of "pull-back" gradient.  The catheterand  the wire  were removed completely out of the body.  Hemodynamics:  Central Aortic Pressure / Mean Aortic Pressure: 143/69 mmHg;93 mmHg  LV Pressure / LV End diastolic Pressure:  142/90 mmHg; 31 mmHg  Left Ventriculography: 15 mL hand-injection  EF:  55%  Wall Motion: I took hypokinesis with no obvious wall motion abnormalities  Coronary Angiographic Data:  Left Main:  100% occluded  Left Anterior Descending (LAD):  Distal vessel fills via LIMA  Distal diagonal branch fills more proximal diagonals  Circumflex (LCx):    Obtuse marginals fill via SVG  Right Coronary Artery: Occluded 100% in the mid vessel the first RV marginal branch gives collaterals to the second marginal branch with no flow the distal vessel. The PDA fills via LIMA to LAD graft  Grafts -- the only known patent grafts were the LIMA to LAD and SVG to OM  LIMA - LAD: Widely patent tortuous vessel. The distal LAD gives diagonal diagonal collaterals as well as distal LAD to RPDA collaterals.  SVG - OM: Just proximal to proximal stent there is a 40% stenosis. The mid proximal and mid stents are widely patent and the vessels are relatively normal until just beyond the distal stent where there is 90% distal in-stent restenosis at the stent edge.  There is TIMI-3 flow down the initial target OM (OM 3) however this had sequential graft to a less prominent OM 2 that was patent with retrograde flow up the OM 2 however did distal portion of the OM  2 had a 90+ percent long tubular stenosis with TIMI 1-2 flow distally.  PERCUTANEOUS CORONARY INTERVENTION PROCEDURE  After reviewing the initial cineangiography images, the culprit lesion(s) was identified, and the decision was made to proceed with percutaneous coronary intervention.  The 5Fr Sheath was exchanged over a wire for a 6Fr sheath.   A weight based bolus of IV Angiomax was administered and the drip was continued until completion of the procedure.   Oral Clopidogrel 300 mg was administered.  A 6 French AL-1 Guide catheter(s) were advanced over a J-wire and used to engage the SVG - OM. An ACT of > 200 Sec was confirmed prior to advancing the Guidewire.  Lesion #1:  Distal SVG to OM, in-stent restenosis  Pre-PCI Stenosis: 90  % Post-PCI Stenosis: 0 %    TIMI 3  Flow down proximal target OM, but TIMI 1-2 flow down sequential OM target -- similar flow pre-and post  Guide Catheter: 6 [place] AL-1  Guidewire: P[place]ater  Pre-Dilitation Balloon: 2.5 mm x 12 mm Emerge Monorail   1st Inflation: 8 Atm for 30 Sec   2nd Inflation: 8 Atm for 30 Sec   Scout angiography did not reveal evidence of dissection or perforation, and no severe change  noted in the sequentially grafted OM pre-and post intracoronary nitroglycerin    Stent:  Promus Element DES   2.75 mm x  12mm   Deployment: 12 Atm for 35 Sec   2nd Inflation: 16 Atm for 45 Sec; final diameter to 25 mm   Post-deployment cineangiography in multiple views demonstrated excellent stent positioning and expansion with TIMI-3 flow in the initially graft to OM 3 but still residual TIMI 1-2 flow in the OM 2 as previously described.  During each balloon inflation the patient did have reproduction of his anginal pain that finally relieved after intracoronary nitroglycerin.  The patient was transported to the PACU in hemodynamics stable, chest pain-free condition.  The sheath is to be removed 2 hours after Angiomax discontinued, with manual pressure  held for hemostasis.   The patient  was stable before, during and following the procedure.   Patient did tolerate procedure well. There were not complications. EBL: <20 mL  Medications:  Premedication: 5 mg  Valium,  Sedation:  None given  Contrast:  230 mL  Omnipaque  Plavix 300 mg  Angiomax bolus and drip (weight based)  Impression:  Severe distal in-stent restenosis of SVG to OM with noted severe disease in the sequentially grafted OM. Otherwise patent stents in his vein graft.   Successful PCI of distal edge in-stent restenosis in the distal portion of the SVG OM With a Promus Element DES 2.75 mm x 12 mm postdilated with the stent balloon to 25 mm.   Preserved ejection fraction with no notable significant wall motion abnormalities.  Widely patent LIMA LAD graft with extensive collateralization as previously described.  With this they possible mild post PCI and positive troponin due to the TIMI 1-2 flow in the distal OM 2.  Plan:  Overnight observation for post PCI care. Will reinitiate his diabetes, dyslipidemia and hypertension medicines.  Despite discharge the morning if stable post PCI.  The case and results was discussed with the patient (and family). The case and results was discussed with the patient's PCP/ Cardiologist.  Time Spend Directly with Patient:  90  minutes  Laakea Pereira W, M.D., M.S. [facility] [address] [place], [place]  [address]  [phone]

## 2011-06-25 NOTE — H&P (Signed)
History and Physical Interval Note:  NAME:  [name]   MRN: [id number] DOB:  [birth year]   ADMIT DATE: 06/25/2011   06/25/2011 11:13 AM  [name] is a 66 y.o. male with a past medical history of coronary bypass grafting in 1995 following redo CABG in 2008. Unfortunately all the new grafts was he doesn't get operation are occluded. He is now undergone intervention on this as a graft to an OM twice with his last time being angry count dated distal and mid intervention in November of 2011. Into relatively well with exception of some of his significant chronic edema, heart rate symptoms until recently when doing so [name] in clinic on February 3 13th of this year when he started noticing that he been having exertional chest discomfort postprandial chest discomfort comparable to his angina these had previously. Skin relieved with nitroglycerin he is now wearing home oxygen has also seemed to help. He notes that this chest discomfort lingers on them for several hours and occasionally will continue up until he goes to sleep at night. He has not really had that much in the way of any exertion to determine if it's exertional or not. Otherwise he has not had any PND or orthopnea go on with his edema. No palpitations no TIA or stroke symptoms. No claudication symptoms. He is on insulin for his diabetes and has pulmonary hypertension requiring nasal oxygen at night. He continues to be on aspirin and Plavix as well as an ACE inhibitor and metoprolol. For his last heart deposition is only remaining conduits aren't is LIMA to LAD that provides retrograde filling of the proximal LAD as well as left to right collaterals to the PDA, as well as SVG to the obtuse marginal branch that now has 3 stents in it.   Acheron Sugg Victor has presented today for surgery, with the diagnosis of chest pain - with known CAD. The various methods of treatment have been discussed with the patient and family. After  consideration of risks, benefits and other options for treatment, the patient has consented to Procedure(s):  LEFT HEART CATHETERIZATION AND CORONARY ANGIOGRAPHY +/- AD HOC PERCUTANEOUS CORONARY INTERVENTION -- as a surgical intervention.   The patients' history has been reviewed, patient examined, no change in status from most recent note, stable for surgery. I have reviewed the patients' chart and labs. Questions were answered to the patient's satisfaction.   During his last clinic visit on February 13 [name] did explain to patient fullness benefits alternatives and indications of procedure and the patient agreed to proceed with this procedure. Informed consent was obtained the signed the form placed on chart. This procedure has been fully reviewed with the patient and written informed consent has been obtained.  BP 123/71  Pulse 60  Temp(Src) 97.6 F (36.4 C) (Oral)  Resp 18  Ht 5\' 10"  (1.778 m)  Wt 127.007 kg (280 lb)  BMI 40.18 kg/m2  SpO2 96% General appearance: alert, cooperative, appears stated age, no distress and moderately obese Neck: no adenopathy, no carotid bruit, no JVD and supple, symmetrical, trachea midline Lungs: clear to auscultation bilaterally, normal percussion bilaterally and non-labored Heart: regularly irregular rhythm, S1, S2 normal, no S3 or S4, systolic murmur: holosystolic 1/6, blowing at lower left sternal border, at apex and no rubs, distant sounds due to body habitus Abdomen: soft, non-tender; bowel sounds normal; no masses,  no organomegaly and obese Extremities: edema 2+ bilateral LE L>R and venous stasis dermatitis noted Pulses:  diminished pedal pulses bilaterally due to edema Skin: hyperpigmentation - lower leg(s) bilateral and hemosiderin staining; red - warm, not hot to touch ==> no sign of infection Neurologic: Alert and oriented X 3, normal strength and tone. Normal symmetric reflexes. Normal coordination and gait Cranial nerves:  normal   Plan : Proceed with LHC +/- PCI   Jackie Littlejohn W [facility] [address] [place], [place]  [address]  [phone]  06/25/2011 11:13 AM

## 2011-06-26 ENCOUNTER — Other Ambulatory Visit: Payer: Self-pay

## 2011-06-26 LAB — CBC
HCT: 40.6 % (ref 39.0–52.0)
Hemoglobin: 13.2 g/dL (ref 13.0–17.0)
WBC: 9.9 10*3/uL (ref 4.0–10.5)

## 2011-06-26 LAB — GLUCOSE, CAPILLARY
Glucose-Capillary: 159 mg/dL — ABNORMAL HIGH (ref 70–99)
Glucose-Capillary: 132 mg/dL — ABNORMAL HIGH (ref 70–99)

## 2011-06-26 LAB — BASIC METABOLIC PANEL
BUN: 16 mg/dL (ref 6–23)
Chloride: 98 mEq/L (ref 96–112)
GFR calc Af Amer: >90 mL/min (ref >90)
Glucose, Bld: 158 mg/dL — ABNORMAL HIGH (ref 70–99)
Potassium: 3.9 mEq/L (ref 3.5–5.1)

## 2011-06-26 MED FILL — Dextrose Inj 5%: INTRAVENOUS | Qty: 50 | Status: AC

## 2011-06-26 NOTE — Discharge Summary (Signed)
Physician Discharge Summary  Patient ID: [name] MRN: [id number] DOB/AGE: 05-Nov-1945 66 y.o.  Admit date: 06/25/2011 Discharge date: 06/26/2011  Discharge Diagnoses:  Principal Problem:  *Crescendo angina Active Problems:  CAD (coronary artery disease), CABG in 1995 and Re-do CABG 2008  CAD (coronary artery disease) of bypass graft, all VG from 2008 CABG are occluded, he was turned down for another re-do CABG at [facility] and at [facility]  Status post coronary artery stent placement, 06/25/11 in distal SVG to OM for instent restenosis 90% to 0% with Promus DES  CKD (chronic kidney disease) stage 1, GFR 90 ml/min or greater  Diastolic dysfunction, grade 3 on Echo  HTN (hypertension)  Dyslipidemia, with allery to statins  Pulmonary HTN, with nighttime oxygen  DM (diabetes mellitus)  Contrast dye induced nephropathy, history of in setting of sepsis and dehydration  Osteoarthritis of both knees, at times he must use a wheelchair.  Edema of both legs, history of and chronic   Discharged Condition: good  Procedures: 06/25/2011 combined left heart catheterization and graft visualization by Dr. Bryan Lemma. 06/25/2011 distal saphenous vein graft to O. And, in-stent restenosis, undergoing PTCA and stent with a Promus drug-eluting stent.  Hospital Course: 66 year old white male with coronary artery disease including coronary artery bypass grafting in 1995 and redo bypass grafting in 2008. His grasper day on except for the LIMA to the LAD in 2008 and he was turned down for another redo CABG at [facility] and at [facility].  In 2011 he had an intervention to the vein graft to the OM.  Over the last several weeks patient has had crescendo angina he was seen by his primary cardiologist Dr. [name] and it was thought he needed to undergo cardiac catheterization to see if the previously placed intervention site had re-occluded.  Patient presented electively and  underwent cardiac catheterization with Dr. Herbie Baltimore.  He did in fact have in-stent restenosis and underwent PTCA and Promus drug-eluting stent to the site, of the saphenous vein graft to the OM.  Patient tolerated the procedure well by the next morning he was stable while he did not wish to walk he was able to ambulate to be discharged.  His vital signs were stable. He was seen by Dr. Bryan Lemma and discharged home.  Consults: None  Significant Diagnostic Studies:  Labs at discharge sodium 136 potassium 3.9 chloride 98 CO2 30 BUN 16 creatinine 0.95 calcium 9.5 glucose 158 hemoglobin 13.2 hematocrit 40.6 WBC 9.9 platelets 202  Followup EKGs revealed sinus rhythm and no acute changes.   Cardiac Cath:Coronary Angiographic Data:  Left Main: 100% occluded  Left Anterior Descending (LAD): Distal vessel fills via LIMA  Distal diagonal branch fills more proximal diagonals  Circumflex (LCx):  Obtuse marginals fill via SVG  Right Coronary Artery: Occluded 100% in the mid vessel the first RV marginal branch gives collaterals to the second marginal branch with no flow the distal vessel. The PDA fills via LIMA to LAD graft Grafts -- the only known patent grafts were the LIMA to LAD and SVG to OM  LIMA - LAD: Widely patent tortuous vessel. The distal LAD gives diagonal diagonal collaterals as well as distal LAD to RPDA collaterals.  SVG - OM: Just proximal to proximal stent there is a 40% stenosis. The mid proximal and mid stents are widely patent and the vessels are relatively normal until just beyond the distal stent where there is 90% distal in-stent restenosis at the stent  edge. There is TIMI-3 flow down the initial target OM (OM 3) however this had sequential graft to a less prominent OM 2 that was patent with retrograde flow up the OM 2 however did distal portion of the OM 2 had a 90+ percent long tubular stenosis with TIMI 1-2 flow distally.   PCI as previously stated.  Discharge Exam: Blood  pressure 123/64, pulse 58, temperature 97.9 F (36.6 C), temperature source Oral, resp. rate 17, height 5\' 10"  (1.778 m), weight 129.7 kg (285 lb 15 oz), SpO2 100.00%.   General appearance: alert, cooperative, appears stated age, no distress, morbidly obese and usual talkative self, normal mood & affect.  Neck: no adenopathy, no carotid bruit, no JVD, supple, symmetrical, trachea midline and thyroid not enlarged, symmetric, no tenderness/mass/nodules  Lungs: clear to auscultation bilaterally, normal percussion bilaterally and non-labored  Heart: regularly irregular rhythm, S1, S2 normal, no S3 or S4, systolic murmur: holosystolic 1/6, blowing at lower left sternal border, at apex and no rub  Abdomen: soft, non-tender; bowel sounds normal; no masses, no organomegaly and obese  Extremities: edema ~2+ bilaterally L>R and venous stasis dermatitis noted  Skin: erythema - lower leg(s) bilateral and hyperpigmentation - lower leg(s) bilateral or consistent with venous stasis dermatitis  Neurologic: Alert and oriented X 3, normal strength and tone. Normal symmetric reflexes. Normal coordination and gait     Disposition: 01-Home or Self Care   Medication List  As of 06/26/2011  3:39 PM   TAKE these medications         aspirin EC 81 MG tablet   Take 81 mg by mouth daily.      Cinnamon 500 MG capsule   Take 500 mg by mouth 2 (two) times daily.      clopidogrel 75 MG tablet   Commonly known as: PLAVIX   Take 75 mg by mouth daily.      Co Q 10 100 MG Caps   Take 100 mg by mouth continuous dialysis.      Grape Seed 50 MG Caps   Take 50 mg by mouth daily.      ibuprofen 200 MG tablet   Commonly known as: ADVIL,MOTRIN   Take 200 mg by mouth every 8 (eight) hours as needed. For inflamation      insulin aspart 100 UNIT/ML injection   Commonly known as: novoLOG   Inject into the skin See admin instructions. 5 grams carbs = 1 unit,  11 gram protein = 1 unit,    10 points over 100 cbg = 1 unit        insulin NPH 100 UNIT/ML injection   Commonly known as: HUMULIN N,NOVOLIN N   Inject 30-35 Units into the skin 3 (three) times daily before meals.      isosorbide mononitrate 30 MG 24 hr tablet   Commonly known as: IMDUR   Take 30 mg by mouth 3 (three) times daily.      lisinopril 5 MG tablet   Commonly known as: PRINIVIL,ZESTRIL   Take 5 mg by mouth at bedtime.      Magnesium Oxide 250 MG Tabs   Take 1 tablet by mouth daily.      metolazone 5 MG tablet   Commonly known as: ZAROXOLYN   Take 5 mg by mouth daily as needed. For fluid      metoprolol 50 MG tablet   Commonly known as: LOPRESSOR   Take 50 mg by mouth 3 (three) times daily.  nitroGLYCERIN 0.4 MG SL tablet   Commonly known as: NITROSTAT   Place 0.4 mg under the tongue every 5 (five) minutes as needed. For chest pain      potassium chloride 10 MEQ tablet   Commonly known as: K-DUR,KLOR-CON   Take 30-40 mEq by mouth daily. Take three times a week and 40 meq four times a week      ranitidine 75 MG tablet   Commonly known as: ZANTAC   Take 75 mg by mouth 2 (two) times daily as needed.      Saw Palmetto (Serenoa repens) 450 MG Caps   Take 450 mg by mouth daily.      TART CHERRY ADVANCED PO   Take 1 tablet by mouth daily.      torsemide 100 MG tablet   Commonly known as: DEMADEX   Take 50-100 mg by mouth daily as needed. For fluid retension      Vitamin D (Ergocalciferol) 50000 UNITS Caps   Commonly known as: DRISDOL   Take 50,000 Units by mouth every 7 (seven) days. On Sunday           Follow-up Information    Follow up with LITTLE, [name], MD on 07/04/2011. (at 2:20  pm)    Contact information:   [address] [place] [address] [phone]        Discharge Instructions:   Call [facility] if any bleeding, swelling or drainage at cath site.  May shower, no tub baths for 48 hours for groin sticks.   Heart Healthy Diabetic  Diet  No driving for 2 days.  No lifting for 2 days.  SignedLeone Brand 06/26/2011, 3:39 PM   I saw the patient this AM & examined him.  I also reviewed the chart as noted in my final Progress Note. He is stable s/p PCI to SVG-OM --> no further angina.   Stable for d/c  Home & f/u with Dr. Langston Reusing.  Marykay Lex, M.D., M.S. [facility] [address] [place], [place]  [address]  [phone]  06/26/2011 4:12 PM

## 2011-06-26 NOTE — Progress Notes (Signed)
Pt adamantly refused ambulation. Explained to pt the need to see if he would have angina walking however he continued to refuse. Sts he does not need any education and is not interested in CRPII. Ethelda Chick CES, ACSM

## 2011-06-26 NOTE — Discharge Instructions (Signed)
Call [facility] if any bleeding, swelling or drainage at cath site.  May shower, no tub baths for 48 hours for groin sticks.   Heart Healthy Diabetic Diet  No driving for 2 days.  No lifting for 2 days.

## 2011-06-26 NOTE — Progress Notes (Signed)
Subjective: Pt. Refuses to walk.   Accelerating angina in pt with CABG and REDO and turned down for 2nd re-do both at [facility] and [facility]. Known graft dysfunction.  Admitted for Cardiac Cath.   Objective: Vital signs in last 24 hours: Temp:  [97.6 F (36.4 C)-98.6 F (37 C)] 97.7 F (36.5 C) (02/21 0805) Pulse Rate:  [58-70] 58  (02/21 0805) Resp:  [15-23] 17  (02/21 0805) BP: (80-141)/(41-100) 119/68 mmHg (02/21 0805) SpO2:  [95 %-100 %] 95 % (02/21 0805) Weight:  [129.7 kg (285 lb 15 oz)] 129.7 kg (285 lb 15 oz) (02/21 0428) Weight change:  Last BM Date: 06/25/11 Intake/Output from previous day: -1905 02/20 0701 - 02/21 0700 In: 720 [P.O.:720] Out: 2625 [Urine:2625] Intake/Output this shift: Total I/O In: 360 [P.O.:360] Out: 450 [Urine:450]  PE:  General appearance: alert, cooperative, appears stated age, no distress, morbidly obese and usual talkative self, normal mood & affect. Neck: no adenopathy, no carotid bruit, no JVD, supple, symmetrical, trachea midline and thyroid not enlarged, symmetric, no tenderness/mass/nodules Lungs: clear to auscultation bilaterally, normal percussion bilaterally and non-labored Heart: regularly irregular rhythm, S1, S2 normal, no S3 or S4, systolic murmur: holosystolic 1/6, blowing at lower left sternal border, at apex and no rub Abdomen: soft, non-tender; bowel sounds normal; no masses,  no organomegaly and obese Extremities: edema ~2+ bilaterally L>R and venous stasis dermatitis noted Skin: erythema - lower leg(s) bilateral and hyperpigmentation - lower leg(s) bilateral or consistent with venous stasis dermatitis Neurologic: Alert and oriented X 3, normal strength and tone. Normal symmetric reflexes. Normal coordination and gait    Lab Results:  Basename 06/26/11 0430 06/25/11 0909  WBC 9.9 10.1  HGB 13.2 13.9  HCT 40.6 42.9  PLT 202 201   BMET  Basename 06/26/11 0430 06/25/11 0909  NA 136 137  K 3.9 4.4  CL 98 102  CO2 30 26    GLUCOSE 158* 156*  BUN 16 21  CREATININE 0.95 0.96  CALCIUM 9.5 9.4    Lab Results  Component Value Date   CHOL  Value: 252        ATP III CLASSIFICATION:  <200     mg/dL   Desirable  782-956  mg/dL   Borderline High  >=213    mg/dL   High       * 0/86/5784   HDL 28* 07/24/2009   LDLCALC  Value: 170        Total Cholesterol/HDL:CHD Risk Coronary Heart Disease Risk Table                     Men   Women  1/2 Average Risk   3.4   3.3  Average Risk       5.0   4.4  2 X Average Risk   9.6   7.1  3 X Average Risk  23.4   11.0        Use the calculated Patient Ratio above and the CHD Risk Table to determine the patient's CHD Risk.        ATP III CLASSIFICATION (LDL):  <100     mg/dL   Optimal  696-295  mg/dL   Near or Above                    Optimal  130-159  mg/dL   Borderline  284-132  mg/dL   High  >440     mg/dL   Very High* 05/07/7251   TRIG  270* 07/24/2009   CHOLHDL 9.0 07/24/2009   Lab Results  Component Value Date   HGBA1C  Value: 8.2 (NOTE)                                                                       According to the ADA Clinical Practice Recommendations for 2011, when HbA1c is used as a screening test:   >=6.5%   Diagnostic of Diabetes Mellitus           (if abnormal result  is confirmed)  5.7-6.4%   Increased risk of developing Diabetes Mellitus  References:Diagnosis and Classification of Diabetes Mellitus,Diabetes Care,2011,34(Suppl 1):S62-S69 and Standards of Medical Care in         Diabetes - 2011,Diabetes Care,2011,34  (Suppl 1):S11-S61.* 05/22/2010    Studies/Results: Dg Chest 2 View  06/25/2011  *RADIOLOGY REPORT*  Clinical Data: History of chest pain.  CHEST - 2 VIEW  Comparison: 05/22/2010.  Findings: There is stable mild enlargement cardiac silhouette. Ectasia and tortuosity of the thoracic aorta are seen. The patient has undergone previous median sternotomy and coronary artery bypass grafting.  No pulmonary edema, pneumonia, or pleural effusion is evident. 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 Osteophytes are seen in the spine.  IMPRESSION: Stable cardiac silhouette enlargement.  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  No acute superimposed process.  Original Report Authenticated By: Crawford Givens, M.D.   Cardiac Cath and PCI:06/25/11:  Lesion #1: Distal SVG to OM, in-stent restenosis  Pre-PCI Stenosis: 90 % Post-PCI Stenosis: 0 %  TIMI 3 Flow down proximal target OM, but TIMI 1-2 flow down sequential OM target -- similar flow pre-and post  Guide Catheter: 6 [place] AL-1 Guidewire: [place]  Pre-Dilitation Balloon: 2.5 mm x 12 mm Emerge Monorail  1st Inflation: 8 Atm for 30 Sec  2nd Inflation: 8 Atm for 30 Sec  Scout angiography did not reveal evidence of dissection or perforation, and no severe change noted in the sequentially grafted OM pre-and post intracoronary nitroglycerin  Stent: Promus Element DES 2.75 mm x 12mm  Deployment: 12 Atm for 35 Sec  2nd Inflation: 16 Atm for 45 Sec; final diameter to 25 mm  Medications: I have reviewed the patient's current medications.  Assessment/Plan: Principal Problem:  *Crescendo angina Active Problems:  CAD (coronary artery disease), CABG in 1995 and Re-do CABG 2008  CAD (coronary artery disease) of bypass graft, all VG from 2008 CABG are occluded, he was turned down for another re-do CABG at [facility] and at [facility]  Pulmonary HTN, with nighttime oxygen  DM (diabetes mellitus)  CKD (chronic kidney disease) stage 1, GFR 90 ml/min or greater  Contrast dye induced nephropathy, history of in setting of sepsis and dehydration  Diastolic dysfunction, grade 3 on Echo  HTN (hypertension)  Status post coronary artery stent placement, 06/25/11 in distal SVG to OM for instent restenosis 90% to 0% with Promus DES  Dyslipidemia, with allery to statins  Osteoarthritis of both knees, at times he must use a  wheelchair.  Edema of both legs, history of and chronic  Dyslipidemia -  will need to discuss with [name] re alternative anti-lipid Rx.  PLAN: MD to see then Prob d/c home. Cr. Good at 0.95. Glucose stable.   LOS: 1 day   INGOLD,LAURA R 06/26/2011, 9:49 AM  I have seen and examined (my examination above) the patient along with Nada Boozer, NP.  I have reviewed the chart, notes and new data.  I agree with Laura's note.  Key new complaints: feels fine; no angina with eating; diuresed well post cath. Key examination changes: stable LE edema (actually down from yesterday) Key new findings / data: Cr & h/H stable.  PLAN: Stable post PCI - restart all home medication. Discharge to home today - f/u with Dr. Mitzie Na, M.D., M.S. [facility] [address] [place], [place]  [address]  [phone]  06/26/2011 1:19 PM

## 2012-03-04 HISTORY — PX: OTHER SURGICAL HISTORY: SHX169

## 2012-07-02 ENCOUNTER — Other Ambulatory Visit (HOSPITAL_COMMUNITY): Payer: Self-pay | Admitting: Cardiology

## 2012-07-29 ENCOUNTER — Ambulatory Visit (HOSPITAL_COMMUNITY)
Admission: RE | Admit: 2012-07-29 | Discharge: 2012-07-29 | Disposition: A | Discharge status: Home / Self Care | Payer: Medicare Other | Source: Ambulatory Visit | Attending: Cardiology | Admitting: Cardiology

## 2012-07-29 DIAGNOSIS — I7789 Other specified disorders of arteries and arterioles: Secondary | ICD-10-CM | POA: Insufficient documentation

## 2012-07-29 DIAGNOSIS — I779 Disorder of arteries and arterioles, unspecified: Secondary | ICD-10-CM

## 2012-07-29 DIAGNOSIS — N181 Chronic kidney disease, stage 1: Secondary | ICD-10-CM | POA: Insufficient documentation

## 2012-07-29 DIAGNOSIS — I25810 Atherosclerosis of coronary artery bypass graft(s) without angina pectoris: Secondary | ICD-10-CM | POA: Insufficient documentation

## 2012-07-29 DIAGNOSIS — I129 Hypertensive chronic kidney disease with stage 1 through stage 4 chronic kidney disease, or unspecified chronic kidney disease: Secondary | ICD-10-CM | POA: Insufficient documentation

## 2012-07-29 DIAGNOSIS — I2789 Other specified pulmonary heart diseases: Secondary | ICD-10-CM | POA: Insufficient documentation

## 2012-07-29 NOTE — Progress Notes (Signed)
Carotid Duplex Completed. [name] D  

## 2012-10-01 ENCOUNTER — Ambulatory Visit: Payer: PRIVATE HEALTH INSURANCE | Admitting: Cardiology

## 2012-10-08 ENCOUNTER — Ambulatory Visit (INDEPENDENT_AMBULATORY_CARE_PROVIDER_SITE_OTHER): Payer: Medicare Other | Admitting: Cardiology

## 2012-10-08 ENCOUNTER — Encounter: Payer: Self-pay | Admitting: Cardiology

## 2012-10-08 VITALS — BP 128/70 | HR 61 | Ht 70.5 in | Wt 286.8 lb

## 2012-10-08 DIAGNOSIS — I2789 Other specified pulmonary heart diseases: Secondary | ICD-10-CM

## 2012-10-08 DIAGNOSIS — R600 Localized edema: Secondary | ICD-10-CM

## 2012-10-08 DIAGNOSIS — M1710 Unilateral primary osteoarthritis, unspecified knee: Secondary | ICD-10-CM

## 2012-10-08 DIAGNOSIS — M170 Bilateral primary osteoarthritis of knee: Secondary | ICD-10-CM

## 2012-10-08 DIAGNOSIS — R609 Edema, unspecified: Secondary | ICD-10-CM

## 2012-10-08 DIAGNOSIS — I519 Heart disease, unspecified: Secondary | ICD-10-CM

## 2012-10-08 DIAGNOSIS — I25810 Atherosclerosis of coronary artery bypass graft(s) without angina pectoris: Secondary | ICD-10-CM

## 2012-10-08 DIAGNOSIS — E785 Hyperlipidemia, unspecified: Secondary | ICD-10-CM

## 2012-10-08 DIAGNOSIS — I209 Angina pectoris, unspecified: Secondary | ICD-10-CM

## 2012-10-08 DIAGNOSIS — I272 Pulmonary hypertension, unspecified: Secondary | ICD-10-CM

## 2012-10-08 DIAGNOSIS — I208 Other forms of angina pectoris: Secondary | ICD-10-CM

## 2012-10-08 DIAGNOSIS — I5189 Other ill-defined heart diseases: Secondary | ICD-10-CM

## 2012-10-08 DIAGNOSIS — I10 Essential (primary) hypertension: Secondary | ICD-10-CM

## 2012-10-08 NOTE — Patient Instructions (Addendum)
A few medication adjustments:  Cut CoQ10 to 200mg  Change (for ~1 week as a trial) Metoprolol to 50 mg 2 x daily Change Imdur to 30 mg ~lunchtime, 30 mg at bed time  You should be able to start Lovaza.  We may need to consider a short-acting Narcotic if your Angina gets worse.  Sliding scale Demadex:  Weigh yourself Daily in the Morning. Current weight here is 286 pounds, which is 4 pounds above what you were 3 months ago. Her dry weight should be some retained 280-282 pounds. For the next 3 days, take 100 mg daily with Zaroxolyn.  This should get you down to her dry weight. Continue to take 50 mg of Demadex daily after that  If you gain more than 3 pounds from dry weight: Increase the Demadex dosing to 100 mg in the morning until weight returns to baseline dry weight.  If weight gain is greater than 5 pounds in 2 days: Increased to Demadex to 100 mg plus Zaroxolyn, contact the office for further assistance if weight does not go down the next day.  If the weight goes down more than 3 pounds from dry weight: Hold Lasix until it returns to baseline dry weight   We will follow-up in ~53months.  I look forward to seeing your labs.

## 2012-10-09 ENCOUNTER — Encounter: Payer: Self-pay | Admitting: Cardiology

## 2012-10-09 NOTE — Assessment & Plan Note (Signed)
His blood pressure is actually relatively well controlled today I am we may need to increase his ACE inhibitor for additional afterload reduction. Once his blood pressure does we decreased the dose of his metoprolol. I have asked him to take standing dose of Demadex at least 50 mg a day perhaps even 50 twice a day or 100 mg a day in addition to Zaroxolyn. He been taking as a when necessary basis and I think that were just too far behind. He started to build up his edema and is having worsening nighttime angina.

## 2012-10-09 NOTE — Assessment & Plan Note (Signed)
We decided to make a few adjustments to his regimen this based on his impression of things. He did take 30 mg of Imdur were at around lunchtime and then 30 mg close to bedtime. He also thinks that he may be on too much her Toprol also was cut back to twice a day as opposed to 3 times a day. My impression is that we'll probably go back to 3 times a day but it is worth trying.  Ultimately what I think we may need to do for his breakthrough anginal pain would become of it and with a short acting narcotic agent. For now what my plan is to increase his diuresis to decrease his filling pressures.

## 2012-10-09 NOTE — Assessment & Plan Note (Signed)
Continue his nighttime oxygen. I think this may be a combination of primary pulmonary related to hypertension do to obesity hypoventilation syndrome but may also be in part due to his grade 3 diastolic dysfunction. I've asked him to increase his diuretic dosing to stay on a stable standing regimen.

## 2012-10-09 NOTE — Assessment & Plan Note (Signed)
Quantitative Lovaza. There may be some interactions he's not taking Zaroxolyn as a standing regimen I think we need to get some high-powered visual least this can help slow his lipids. Hopefully when the new dyslipidemia medications come out we will to try them he'll to tolerate them. This continues we will the major issues for him, as far as risk factor modification.

## 2012-10-09 NOTE — Assessment & Plan Note (Signed)
He continues to be on a beta blocker and ACE inhibitor as well as nitrates as well as aspirin and Plavix. There are not many other options for revascularization. He is not done well with Myoview stress test in the past. I do think however if things continue to go away they are after diuresing him, we may need to check for ischemia. I like you to avoid catheterizations unless as a obvious evidence of ischemia that's new simply because of his history of contrast nephropathy.

## 2012-10-09 NOTE — Assessment & Plan Note (Signed)
Wound Care for his venous stasis ulcers in the very important. I would love it if you could address those lesions and be able to wear his compression stockings. Is very very beneficial for him wear the stockings but they seem to cause pressure sores. Intermedius lighter weight support does, or diabetic socks. Was with his diabetes I'm very concerned about the stasis, combined with diabetic ulcers getting infected. No active signs now.  I think we have to be indurated the concept of a sliding scale Lasix. We'll contact him at home again just to make sure that he is doing his daily weights his try weight should be close to 280 pounds, based on when he was 3 months ago.

## 2012-10-09 NOTE — Assessment & Plan Note (Signed)
Hopefully this will continue limited that much. I very much encouraged him to get into doing her water aerobics distally walking in the water as very helpful. The climax he will necessarily hurt his venous stasis.

## 2012-10-09 NOTE — Assessment & Plan Note (Signed)
Currently well-controlled, however it probably like to see it a little bit lower. It is planned for next visit will be to consider increasing his ACE inhibitor dose.

## 2012-10-09 NOTE — Progress Notes (Signed)
Patient ID: [name], male   DOB: 1945-11-16, 67 y.o.   MRN: [id number]  Clinic Note: HPI: [name] is a 67 y.o. male with a complicated past medical history as described below who presents today for routine followup of his coronary disease, grade 3 diastolic dysfunction, chronic stable angina, obesity ventilation syndrome with pulmonary hypertension, chronic lotion reading of venous stasis ulcers. He is a long-term patient of Dr. Oneta Rack little sweat first met when I assisted the PCI in November 2011. I perform PCI and interpreted doesn't or can't have taken over his primary cardiologist. He is pretty much end stage of his coronary disease but not that much in way of revascularization options. He is sequential graft to OM1 2 or one of the OM does all but atretic. There have been multiple interventions on that graft.  Interval History: Since his last visit he said that he would eat increases to 210 up to 300 mg but that unexpectedly event inextricably drove his blood sugars extremely high. He did like how it overall made him feel. Unfortunately when his sugar is elevated this exacerbates his nighttime angina. He basically describes it in the evening after eating dinner about an hour so later he will start having significant anginal pain that he's now and take multiple doses of nitroglycerin over the course of the evening. Sometimes very difficult to sleep with the headache is bad enough with laying down he has to go sleep in a chair. He deathly has orthopnea and PND.  He says on any given night he'll take anywhere from 2-3 nitroglycerin over the course of the evening but not as early in sequential fashion. He still wears nighttime oxygen M., and once he gets to sleep yet she does relatively well.   Edema is pretty well controlled. He's been having heart I'm wearing compression stockings because it pulls him off it peels off scabs from his venous stasis ulcers. So is reluctant to  wear them now. The edema is nowhere near as bad as it did in the past. He has not been taking his diuretic on a regular basis previous taken more the Zaroxolyn as opposed to taking the torsemide. His also started taking daily.X. and appeared to provide Be.  He's not very active as far as exercise, but has been undergoing treatments for his also arthritis and is now able to walk into this clinic visit which he hasn't done for the last several visits. His rate happy about that and is hoping to get back and does water aerobics. He had been really well his weight loss but is now plateaued. He doesn't notice that much the way of exertional angina, mostly because he doesn't exert himself enough to have angina. He does have exertional dyspnea.  He he denies any frank hematochezia or melena. No hematuria. He denies any palpitations. Will he may occasionally get a little lightheaded with first standing up he denies any syncope or near-syncopal type episodes. No TIA or amaurosis fugax symptoms. He also denies any claudication symptoms.  After his last visit we ordered carotid Dopplers which showed left-sided stenosis of roughly 50-69% with right-sided being less than 50%. This will be need to be followed up on an annual basis.  One other very interesting thing that he does note is that he's not sure why with the law for several weeks that sometimes he'll eat food and try drink something and 10 minutes later will regurgitate everything, having not ever entered  stomach.    He also notes that the PA at the [place] is very concerned about the combination of Lovaza and his diuretics - I am not sure to make of this. There may be interaction with Zaroxolyn that actually decreases the effectiveness of Lovaza.  Past Medical History  Diagnosis Date  . CAD (coronary artery disease), native coronary artery 1995    Initial CABG 1985, redo CABG 2008  . Hypertension   . High cholesterol   . Carotid stenosis, right   .  Myocardial infarction     "total of ~ 15; 3 landed me in the hospital"  . Angina, chronic stable     Likely combination of small vessel disease as well as grade 3 diastolic dysfunction  . GERD (gastroesophageal reflux disease)   . SOB (shortness of breath) on exertion     "w/angina, when lying down"  . Diabetes mellitus   . Arthritis     "hands & legs; knees"  . Acute renal failure 12/2010    S/P "cillin and/or dye from CT scan"  . CAD (coronary artery disease), CABG in 1995 and Re-do CABG 2008 06/25/2011  . CAD (coronary artery disease) of bypass graft, all VG from 2008 CABG are occluded, he was turned down for another re-do CABG at [facility] and at [facility] 06/25/2011    Does not do well with Myoview stress test  . Pulmonary HTN, with nighttime oxygen 06/25/2011    "at night when I sleep & sometimes during the day; for my angina"  . DM (diabetes mellitus) 06/25/2011  . CKD (chronic kidney disease) stage 2, GFR 60-89 ml/min 06/25/2011  . Contrast dye induced nephropathy, history of in setting of sepsis and dehydration 06/25/2011    History of  . Diastolic dysfunction, grade 3 on Echo 06/25/2011    Preserved ejection fraction of 50-55% with mild inferior hypokinesis. Sclerotic aortic valve.  Marland Kitchen HTN (hypertension) 06/25/2011  . Status post coronary artery stent placement, 06/25/11 in distal SVG to OM for instent restenosis 90% to 0% with Promus DES 06/25/2011  . Dyslipidemia, with allery to statins 06/25/2011    Also intolerant of fenofibrate  . Osteoarthritis of both knees, at times he must use a wheelchair. 06/25/2011  . Edema of both legs, history of and chronic 06/25/2011    Chronic venous stasis ulcers making it difficult to wear compression stockings.  . Obesity hypoventilation syndrome     Prior cardiac evaluation and past surgical history: Past Surgical History  Procedure Laterality Date  . Coronary angioplasty with stent placement  April 2011    Distal SVG to OM -- Promus DES 3.0 mm  23 mm  . Tonsillectomy and adenoidectomy      "as a kid"  . Appendectomy      "as a kid"  . Coronary artery bypass graft  1996    CABG X4; LIMA-LAD, SVG-diagonal, SVG-OM1-L2, SVG-distal RCA.  Marland Kitchen Coronary artery bypass graft  2008    CABG X2; free radial from the would've SVG-OM 2 distal OM, and redo SVG-RCA -- both grafts occluded several months later. Time down for third redo CABG  . Coronary angioplasty with stent placement  November 2011    Repeat PCI space--complex procedure using Guideliner: 2 site PCI SVG to OM1-OM 2: 2.75 mm 12 mm Promus DDS distally, 3.5 mm 20 mm proximally.  . Coronary angioplasty with stent placement  February 2013    PCI to the very distal segment/anastomotic lesion of the SVG-OM1-OM 2. 2.75 mm  12 mm probes DES stent OM1 all but occluded/atretic. OM 2 widely patent.    Allergies  Allergen Reactions  . Ivp Dye (Iodinated Diagnostic Agents) Other (See Comments)    "found out in August 2012; I'm allergic to dye used in CT scans; causes lots of edema"  . Statins Other (See Comments)    Muscle ache; "rhabdomyolysis"  . Ranexa (Ranolazine)     Jittery,as well as constipation and bleeding hemorrhoids.  . Adhesive (Tape) Itching and Rash  . Penicillins Swelling and Rash    Current Outpatient Prescriptions  Medication Sig Dispense Refill  . B Complex Vitamins (B COMPLEX PO) Take 1 capsule by mouth daily.      . Cholecalciferol (VITAMIN D-3) 1000 UNITS CAPS Take by mouth. Take 2 capsules in the morning Monday through Saturday.      . Cinnamon 500 MG capsule Take 500 mg by mouth 2 (two) times daily.      . clopidogrel (PLAVIX) 75 MG tablet Take 75 mg by mouth daily.      . Coenzyme Q10 (CO Q 10) 100 MG CAPS Take 100 mg by mouth 3 (three) times daily.       . Grape Seed 50 MG CAPS Take 50 mg by mouth daily.      Marland Kitchen ibuprofen (ADVIL,MOTRIN) 200 MG tablet Take 200 mg by mouth every 8 (eight) hours as needed. For inflamation      . insulin aspart (NOVOLOG) 100 UNIT/ML  injection Inject into the skin See admin instructions. 5 grams carbs = 1 unit,  11 gram protein = 1 unit,    10 points over 100 cbg = 1 unit      . insulin NPH (HUMULIN N,NOVOLIN N) 100 UNIT/ML injection Inject 30-35 Units into the skin 3 (three) times daily before meals.      . isosorbide mononitrate (IMDUR) 30 MG 24 hr tablet Takes 1/2 tablet twice a day, takes 1 tablet in the evening.      Marland Kitchen lisinopril (PRINIVIL,ZESTRIL) 5 MG tablet Take 5 mg by mouth at bedtime.      . Magnesium Oxide 250 MG TABS Take 1 tablet by mouth daily.      . metolazone (ZAROXOLYN) 5 MG tablet Take 5 mg by mouth daily as needed. For fluid      . metoprolol (LOPRESSOR) 50 MG tablet Take 50 mg by mouth 3 (three) times daily.      . nitroGLYCERIN (NITROSTAT) 0.4 MG SL tablet Place 0.4 mg under the tongue every 5 (five) minutes as needed. For chest pain      . potassium chloride (K-DUR,KLOR-CON) 10 MEQ tablet Take 40 meq three times a week and 30 meq four times a week.      . Probiotic Product (PROBIOTIC DAILY PO) Take 1 capsule by mouth every other day.      . Saw Palmetto, Serenoa repens, 450 MG CAPS Take 450 mg by mouth daily.      Marland Kitchen torsemide (DEMADEX) 100 MG tablet Take 50-100 mg by mouth daily as needed. For fluid retension      . Vitamin D, Ergocalciferol, (DRISDOL) 50000 UNITS CAPS Take 50,000 Units by mouth every 7 (seven) days. On Sunday       No current facility-administered medications for this visit.    History   Social History  . Marital Status: Married    Spouse Name: N/A    Number of Children: N/A  . Years of Education: N/A   Occupational History  .  Not on file.   Social History Main Topics  . Smoking status: Former Smoker -- 2.00 packs/day    Quit date: 03/19/1985  . Smokeless tobacco: Never Used  . Alcohol Use: Yes     Comment: 06/25/11 "rarely have a drink"  . Drug Use: No  . Sexually Active: No   Other Topics Concern  . Not on file   Social History Narrative   Usually he comes in  with a wheelchair. He is very happy walking today. Does not get much exercise due to that. He is hoping to do his water aerobics this summer.      Is a married father 18, grandfather to. The previous he started doing Silver sneakers but was really bothered by his knees. He been doing very well with weight loss but has altered somewhat with that..    ROS: A comprehensive Review of Systems - General ROS: positive for  - sleep disturbance and Mostly associated with orthopnea, PND and supine angina, but on nighttime oxygen negative for - chills, fatigue, fever, night sweats or weight gain Endocrine ROS: He is having multiple labs checked by his endocrinologist. He noted he asked portable rise in his glucose levels with Coenzyme Q10 Musculoskeletal ROS: positive for - gait disturbance, joint pain and pain in knee - bilateral and Notes that this has improved with the hyaluronic acid treatment he was having done Dermatological ROS: positive for Venous stasis dermatitis with stasis ulcers.  PHYSICAL EXAM BP 128/70  Pulse 61  Ht 5' 10.5" (1.791 m)  Wt 286 lb 12.8 oz (130.092 kg)  BMI 40.56 kg/m2  General appearance: alert, cooperative, appears stated age, morbidly obese and Normal mood and affect. Neck: JVD - a few cm above sternal notch, no adenopathy, supple, symmetrical, trachea midline, thyroid not enlarged, symmetric, no tenderness/mass/nodules and Left greater than right carotid bruit Lungs: clear to auscultation bilaterally, normal percussion bilaterally and Nonlabored, just diminished throughout due to body habitus. No wheezes rales or rhonchi. Heart: regular rate and rhythm, S1, S2 normal, S4 present and Unable to palpate in pulse beat. Distant heart sounds. no other murmurs or rubs and noted to be significant. Abdomen: normal findings: bowel sounds normal, no bruits heard, no masses palpable and Soft/nontender and abnormal findings:  obese and Unable to palpate hepatomegaly due to body  habitus Extremities: edema 1-2+ bilaterally, venous stasis dermatitis noted and With diffuse mild stasis ulcers in various stages of healing Pulses: Diminished low short pulses of the 2/2 edema. Skin: Brawny hemosiderin staining, venous stasis changes. See extremities above Neurologic: Mental status: Alert, oriented, thought content appropriate, affect: mood-congruent Cranial nerves: normal HEENT: Wears glasses, [place]/AT, EOMI, MMM, anicteric sclera. Mild right eyelid cholesteatoma  JXB:[id number] today: Yes Rate: 61 , Rhythm: Sinus with first-degree block;   ST-T wave abnormalities with depression and mild T wave inversion in the anterolateral and inferior lateral leads that is maybe a little bit more pronounced today than before. Consider inferolateral/anterolateral ischemia  -- would correlate with his known anatomy.   ASSESSMENT: Relatively stable overall, however I think immediately with more volume overloaded that he had been before. I think more volume up he is more prone he is having angina. The angina is probably combination of microvascular disease with poor microvascular circulation in general due to the occluded grafts and native arteries. This is in combination with grade 3 diastolic dysfunction pulmonary hypertension with obesity ventilation syndrome.  CAD (coronary artery disease) of bypass graft, all VG from 2008 CABG are occluded,  he was turned down for another re-do CABG at [facility] and at [facility]  Pulmonary HTN, with nighttime oxygen  Edema of both legs, history of and chronic  Diastolic dysfunction, grade 3 on Echo  Dyslipidemia, with allery to statins  Osteoarthritis of both knees, at times he must use a wheelchair.  Stable angina, chronic - it is usually occurring at night but also with moderate exertion  HTN (hypertension)  Morbid obesity - with likely obesity hypoventilation syndrome  PLAN: Per problem list. I spent well over 30 minutes talking with him  about his multiple problems and well over 30 minutes on his chart. He has multiple comorbidities they're all in her related. He also has multiple drug intolerances making a treatment difficult. This overall requires complex decision-making and adjustments.  Followup: 3 months  Khaleel Beckom W, M.D., M.S. [facility] [address] [place], [place]  [address]  [phone] Pager # [phone] 10/09/2012 11:11 AM

## 2012-10-29 ENCOUNTER — Telehealth: Payer: Self-pay | Admitting: *Deleted

## 2012-10-29 NOTE — Telephone Encounter (Signed)
Spoke to patient. Informed patient that Dr Herbie Baltimore reviewed labs. No changes. Labs scanned.

## 2013-01-28 ENCOUNTER — Encounter: Payer: Self-pay | Admitting: Cardiology

## 2013-01-28 ENCOUNTER — Ambulatory Visit (INDEPENDENT_AMBULATORY_CARE_PROVIDER_SITE_OTHER): Payer: Medicare Other | Admitting: Cardiology

## 2013-01-28 VITALS — BP 122/64 | HR 61 | Ht 70.5 in | Wt 296.5 lb

## 2013-01-28 DIAGNOSIS — I5189 Other ill-defined heart diseases: Secondary | ICD-10-CM

## 2013-01-28 DIAGNOSIS — R079 Chest pain, unspecified: Secondary | ICD-10-CM

## 2013-01-28 DIAGNOSIS — Z79899 Other long term (current) drug therapy: Secondary | ICD-10-CM

## 2013-01-28 DIAGNOSIS — R600 Localized edema: Secondary | ICD-10-CM

## 2013-01-28 DIAGNOSIS — I2789 Other specified pulmonary heart diseases: Secondary | ICD-10-CM

## 2013-01-28 DIAGNOSIS — I209 Angina pectoris, unspecified: Secondary | ICD-10-CM

## 2013-01-28 DIAGNOSIS — R0609 Other forms of dyspnea: Secondary | ICD-10-CM

## 2013-01-28 DIAGNOSIS — I272 Pulmonary hypertension, unspecified: Secondary | ICD-10-CM

## 2013-01-28 DIAGNOSIS — I208 Other forms of angina pectoris: Secondary | ICD-10-CM

## 2013-01-28 DIAGNOSIS — I519 Heart disease, unspecified: Secondary | ICD-10-CM

## 2013-01-28 DIAGNOSIS — R609 Edema, unspecified: Secondary | ICD-10-CM

## 2013-01-28 LAB — COMPREHENSIVE METABOLIC PANEL
AST: 12 U/L (ref 0–37)
BUN: 21 mg/dL (ref 6–23)
Calcium: 9.1 mg/dL (ref 8.4–10.5)
Chloride: 100 mEq/L (ref 96–112)
Creat: 1.03 mg/dL (ref 0.50–1.35)
Glucose, Bld: 173 mg/dL — ABNORMAL HIGH (ref 70–99)

## 2013-01-28 NOTE — Patient Instructions (Addendum)
When you go home today take 50 mg of toresmide weigh yourself everyday and weigh today.  Then take 100 mg of demadex for 3 days with zarolyxn Then take 50 mg of demadex every day until you are 10 lbs lighter  PRO-BNP,CMP today and next week -THURS  Stop ibupofren  You can use tylenol/acetominphen [phone] mg up to twice a day  Your physician wants you to follow-up in 2 weeks [facility].  You will receive a reminder letter in the mail two months in advance. If you don't receive a letter, please call our office to schedule the follow-up appointment.   Talk to the [place] about the ucler on your legs

## 2013-01-28 NOTE — Progress Notes (Signed)
Patient ID: [name], male   DOB: 1946/01/01, 67 y.o.   MRN: [id number]  Clinic Note: HPI: [name] is a 67 y.o. male with a complicated past medical history as described below who presents today for routine followup of his coronary disease, grade 3 diastolic dysfunction, chronic stable angina, obesity ventilation syndrome with pulmonary hypertension, chronic lotion reading of venous stasis ulcers. He is a long-term patient of Dr. Caprice Kluver, whom I first met when I assisted the PCI in November 2011. I perform PCI and interpreted doesn't or can't have taken over his primary cardiologist. He is pretty much end stage of his coronary disease but not that much in way of revascularization options. He is sequential graft to OM1 2 or one of the OM does all but atretic. There have been multiple interventions on that graft.  Interval History: Unfortunately, since his last episode he has gained about 10-11 pounds.  His edema has gotten worse.  A lot of his bedroom when he started having a lot of back neck and back pain for which he said to take ibuprofen.  I think he misunderstood ibuprofen versus Tylenol and body cannot take Tylenol. He's been having episodes where his blood pressures will drop quite a bit and he'll have near blackout spells.  His anatomy really weird swings in his blood glucose levels.  With increased swelling of his legs, he now has worsening lower leg Ulcers from venous stasis.  He says his vision has gotten worse in both eyes. He notes worsening orthopnea and PND symptoms.  He still has significant exertional dyspnea.  He notes that his stable angina symptoms get worse when his blood sugars are up.  They're usually relatively well controlled with his current regimen however.  He he denies any frank hematochezia or melena, but does note blood when he wipes.. No hematuria. He denies any palpitations. Will he may occasionally get a little lightheaded with first standing  up he denies any syncope or near-syncopal type episodes. No TIA or amaurosis fugax symptoms. He also denies any claudication symptoms.  Cardiovascular ROS: positive for - chest pain, dyspnea on exertion, edema, orthopnea, paroxysmal nocturnal dyspnea and Basically stable anginal symptoms.  Not necessarily worse. negative for - irregular heartbeat, murmur, palpitations, rapid heart rate or Or resting dyspnea.  PAST MEDICAL HISTORY: 1. Coronary disease: Initial CABG in 1995: LIMA-LAD, SVG-diagonal, SVG-OM1-OM2, SVG-distal RCA.  a. Redo CABG in 2008 in the setting of ACS: Free radial from hood of vein graft to OM down to the distal OM, new vein graft to the right. Both found to be occluded by catheterization several months later. Turned down for 3rd redo CABG; this was by 2 institutions.  b. PCI to proximal SVG to OM in April 2011 with a Promus DES 3.0 x 23 mm stent.  c. Repeat PCI in a complex GuideLiner-assisted procedure in November of 2011 at 2 sites in the SVG to the sequential OM1-OM2 with a 2.75 x 12 mm Promus DES distally and 3.5 x 28 mm proximally.  d. Most recent PCI in February 2013 to a segment distal to the recently placed distal stent in the vein graft to the OM1-OM2. Treated with a 2.75 x 12 mm DES stent post dilated to 2.85 mm just prior to the anastomosis to the OM. At that time the sequential limb which actually appeared to be more the OM1 was a miniscule vessel with minimal flow distally. He likely has lost that downstream  branch.  2. Chronic grade 3 diastolic dysfunction by echocardiogram, but a normal EF of 50-55% with mild inferior hypokinesis. Sclerotic valve and grade 3 diastolic dysfunction/pseudonormalization which explains his angina and dyspnea.  3. Hypertension.  4. Dyslipidemia, intolerant of statins and every kind of medication.  5. Pulmonary hypertension for which he uses nighttime oxygen; this is due to obesity hyperventilation as well as diastolic dysfunction.   6. Diabetes on insulin and oral medications.  7. History of contrast nephropathy in the setting of sepsis and dehydration.  8. Bilateral knee osteoarthritis.  9. Bilateral lower extremity edema with chronic venous stasis.  10. Moderate to severe obesity a BMI of maybe 36. 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 This will be need to be followed up on an annual basis. Additional Past Medical and Surgical History Reviewed in Epic   Allergies  Allergen Reactions  . Ivp Dye [Iodinated Diagnostic Agents] Other (See Comments)    "found out in August 2012; I'm allergic to dye used in CT scans; causes lots of edema"  . Statins Other (See Comments)    Muscle ache; "rhabdomyolysis"  . Ranexa [Ranolazine]     Jittery,as well as constipation and bleeding hemorrhoids.  . Adhesive [Tape] Itching and Rash  . Penicillins Swelling and Rash    Current Outpatient Prescriptions  Medication Sig Dispense Refill  . B Complex Vitamins (B COMPLEX PO) Take 1 capsule by mouth daily.      . Cholecalciferol (VITAMIN D-3) 1000 UNITS CAPS Take by mouth. Take 2 capsules in the morning Monday through Saturday.      . Cinnamon 500 MG capsule Take 1,000 mg by mouth 2 (two) times daily. Plus Chromium 100mg       . clopidogrel (PLAVIX) 75 MG tablet Take 75 mg by mouth daily.      . Coenzyme Q10 (CO Q 10) 100 MG CAPS Take 100 mg by mouth 2 (two) times daily.       . Grape Seed 50 MG CAPS Take 50 mg by mouth daily.      Marland Kitchen Hyaluronic Acid-Vitamin C (HYALURONIC ACID PO) Take 100 mg by mouth daily as needed.      Marland Kitchen ibuprofen (ADVIL,MOTRIN) 200 MG tablet Take 200 mg by mouth every 8 (eight) hours as needed. For inflamation      . insulin aspart (NOVOLOG) 100 UNIT/ML injection Inject into the skin See admin instructions. 5 grams carbs = 1 unit,  11 gram protein = 1 unit,    10  points over 100 cbg = 1 unit      . insulin NPH (HUMULIN N,NOVOLIN N) 100 UNIT/ML injection Inject 35-40 Units into the skin 3 (three) times daily before meals.       . isosorbide mononitrate (IMDUR) 30 MG 24 hr tablet Take 15 mg by mouth 3 (three) times daily. Mostly 15mg  TID, occasionally takes 30mg       . lisinopril (PRINIVIL,ZESTRIL) 5 MG tablet Take 10 mg by mouth at bedtime.       . Magnesium Oxide 250 MG TABS Take 1 tablet by mouth daily.      . metolazone (ZAROXOLYN) 5 MG tablet Take 5 mg by mouth daily as needed. For fluid      . metoprolol (LOPRESSOR) 50 MG tablet Take 50 mg by mouth 3 (three) times daily.      Marland Kitchen  nitroGLYCERIN (NITROSTAT) 0.4 MG SL tablet Place 0.4 mg under the tongue every 5 (five) minutes as needed. For chest pain      . potassium chloride (K-DUR,KLOR-CON) 10 MEQ tablet Take 40 meq three times a week and 30 meq four times a week.      . Probiotic Product (PROBIOTIC DAILY PO) Take 1 capsule by mouth every other day.      . Saw Palmetto, Serenoa repens, 450 MG CAPS Take 450 mg by mouth daily.      Marland Kitchen torsemide (DEMADEX) 100 MG tablet Take 50-100 mg by mouth daily as needed. For fluid retension      . Vitamin D, Ergocalciferol, (DRISDOL) 50000 UNITS CAPS Take 50,000 Units by mouth every 7 (seven) days. On Sunday       No current facility-administered medications for this visit.    History   Social History  . Marital Status: Married    Spouse Name: N/A    Number of Children: N/A  . Years of Education: N/A   Occupational History  . Not on file.   Social History Main Topics  . Smoking status: Former Smoker -- 2.00 packs/day    Quit date: 03/19/1985  . Smokeless tobacco: Never Used  . Alcohol Use: Yes     Comment: 06/25/11 "rarely have a drink"  . Drug Use: No  . Sexual Activity: No   Other Topics Concern  . Not on file   Social History Narrative   Usually he comes in with a wheelchair. He is very happy walking today. Does not get much exercise due to  that. He is hoping to do his water aerobics this summer.      Is a married father 55, grandfather to. The previous he started doing Silver sneakers but was really bothered by his knees. He been doing very well with weight loss but has altered somewhat with that..    ROS: A comprehensive Review of Systems -   General ROS: positive for  - sleep disturbance and Mostly associated with orthopnea, PND and supine angina, but on nighttime oxygen -- this is actually got worse over last couple weeks.  He is also noted significant weight gain, and has not changed his Lasix dosing.  Endocrine ROS:  very labile blood sugar levels.    Musculoskeletal ROS: positive for - gait disturbance, joint pain and pain in knee - bilateral and Notes that this has improved with the hyaluronic acid treatment he was having done  Dermatological ROS: positive for Venous stasis dermatitis withworsening  stasis ulcers.Essentially the lower three quarters of his legs from the knee down is nothing but scar tissue with purple discoloration.  There is diffuse scars throughout.  PHYSICAL EXAM BP 122/64  Pulse 61  Ht 5' 10.5" (1.791 m)  Wt 296 lb 8 oz (134.492 kg)  BMI 41.93 kg/m2  General appearance: alert, cooperative, appears stated age, morbidly obese and Normal mood and affect. Neck: JVD - 3-4 cm above sternal notch, no adenopathy, supple, symmetrical, trachea midline, thyroid not enlarged, symmetric, no tenderness/mass/nodules and Left greater than right carotid bruit Lungs: Mostly CTAB with mild diminished sounds and faint rales in the bases.  Normal percussion bilaterally and Nonlabored, just diminished throughout due to body habitus. No wheezes or rhonchi. Heart: RRR, S1, S2 normal, S4 present; Unable to palpate apical impulse . Distant heart sounds. no other murmurs or rubs and noted to be significant. Abdomen: Grossly obese, unable to palpate any HSM.  Cannot tell if there  is edema or ascites.   Extremities: edema 3+  bilaterally, venous stasis dermatitis noted and With diffuse worsening stasis ulcers in various stages of healing Pulses: Diminished low short pulses of the 2/2 edema. Skin: Brawny/purple hemosiderin staining, venous stasis changes. See extremities above Neurologic: Mental status: Alert, oriented, thought content appropriate, affect: mood-congruent HEENT: Wears glasses, [place]/AT, EOMI, MMM, anicteric sclera. Mild right eyelid cholesteatoma  [id number] today: Yes Rate: 61 , Rhythm: Sinus with first-degree block;  RBBB, mild septal T wave inversions.  -- would correlate with his known anatomy.   ASSESSMENT/PLAN: I thinkhe is notably more volume overloaded that he had been before. He is quite a bit  volume up he is more prone he is having angina. The angina is probably combination of microvascular disease with poor microvascular circulation in general due to the occluded grafts and native arteries. This is in combination with grade 3 diastolic dysfunction pulmonary hypertension with obesity ventilation syndrome.  Edema of both legs, chronic --  with with chronic venous stasis ulcers Ms. Foley is gotten much worse, he is very volume overloaded.  I am concerned that he may need it admitted to the hospital soon as we can get this down.  I told him a little bit for not following the clearly explained sliding-scale torsemide regimen that I gave him last visit -- he simply adjust his doses according to what he thinks is due and does not pay attention to his weight.  I really think that some of this weight gain could come from taking increased doses of NSAIDs.  Plan: Stop NSAIDs, take full dose, 100 mg Demadex for the next 3 days along with Zaroxolyn.  After that he can reduce to 50 mg Demadex every day until he lost 10 pounds.  He would use or a Zaroxolyn as well. A recheck a pro-BNP and CMP in roughly one week in order to adjust therapy prior to the weekend.  He will followup with Corine Shelter, [place] in  roughly 2 weeks. I also told him his goes to the [place] [facility].  Diastolic dysfunction, grade 3 on Echo Again reiterated sliding scale Demadex.  We'll get to his episode then I will go back to the same sliding scale that recommended on his previous visit.  Sliding scale Demadex:  Weigh yourself Daily in the Morning.  Your dry weight should be 280-282 pounds.  For the next 3 days, take 100 mg daily with Zaroxolyn. This should get you down to her dry weight.  Continue to take 50 mg of Demadex daily after that  If you gain more than 3 pounds from dry weight: Increase the Demadex dosing to 100 mg in the morning until weight returns to baseline dry weight.  If weight gain is greater than 5 pounds in 2 days: Increased to Demadex to 100 mg plus Zaroxolyn, contact the office for further assistance if weight does not go down the next day.  If the weight goes down more than 3 pounds from dry weight: Hold Lasix until it returns to baseline dry weight   Pulmonary HTN, with nighttime oxygen He clearly has obesity hyperventilation syndrome.  He really should be on CPAP, but he was unable to tolerate it.  His volume status is very important.  Weight loss of the great panel to this happening.  If he doesn't get him fitted for edema, my recommendation would be to consider Right Heart Catheterization simply to fully assess his opening pressures and volume status.  Stable angina,  chronic - it is usually occurring at night but also with moderate exertion Very limited options.  Thankfully does not be proper and right now.  He still has his angina clearly made worse with volume overload.  Again is multifactorial with grade 3 diastolic dysfunction, microvascular disease very poor overall cardiac circulation.  This is combined with his hypoventilation syndrome.  He is intolerant of medications.  We have moderately stable dose of Toprol, and Imdur.  We may adjust his last visit.  As I mentioned if he continues having  angina I would have to consider a narcotic regimen for breakthrough discomfort.  Morbid obesity - with likely obesity hypoventilation syndrome Workouts losing battle here.  He does not seem to be able to do any type of exercise.  He really needs dietary education with diet adjustment.  I will readdress the concept of dietary counseling on his followup visit.  Chest pain I am not sure if this is angina or just simply volume overload. See chronic angina above.  DOE (dyspnea on exertion) Multifactorial with obesity, pulmonary dictation, grade 3 diastolic dysfunction, microvascular and macrovascular agent for CAD both large and small vessel, probably obesity ventilation syndrome.  Now exacerbated by a volume overload resulting from noncompliance with his regiment and use of NSAIDs.  Switch of NSAIDs and Tylenol.  If additional as needed he will need a stronger narcotic regimen.  He needs to see his primary physician for that.  Encounter for long-term (current) use of other medications We'll need to check labs CMP, and pro BNP to evaluate his heart value or a while on torsemide and Zaroxolyn.    Documentation Notification I spent well over 30 minutes talking with him about his multiple problems and well over 30 minutes on his chart. He has multiple comorbidities they're all in her related. He also has multiple drug intolerances making a treatment difficult. This overall requires complex decision-making and adjustments.  My recommendation to him were the following: When you go home today take 50 mg of toresmide weigh yourself everyday and weigh today.  Then take 100 mg of demadex for 3 days with zarolyxn Then take 50 mg of demadex every day until you are 10 lbs lighter  PRO-BNP,CMP today and next week -THURS  Stop ibupofren  You can use tylenol/acetominphen [id number] mg up to twice a day  Your physician wants you to follow-up in 2 weeks [facility].  You will receive a reminder letter in  the mail two months in advance. If you don't receive a letter, please call our office to schedule the follow-up appointment.   Talk to the [place] about the ucler on your legs  Followup: 2 weeks with Diona Fanti, then roughly one month after that if stable with me. [facility] on [facility].   Marykay Lex, M.D., M.S. [facility] [address] [place], [place]  [address]  [phone] Pager # [phone] 02/01/2013 11:48 PM

## 2013-01-31 ENCOUNTER — Telehealth: Payer: Self-pay | Admitting: *Deleted

## 2013-01-31 NOTE — Telephone Encounter (Signed)
Spoke to patient. Result given . Verbalized understanding  Patient states he was was fasting on  Friday. He has a weight gain today 190,was 184 yesterday,  He states he will take  100 mg demadex today.

## 2013-01-31 NOTE — Telephone Encounter (Signed)
Message copied by Tobin Chad on Mon Jan 31, 2013  9:57 AM ------      Message from: [facility], DAVID      Created: Sat Jan 29, 2013  8:39 PM       So it looks like you are a bit fluid overloaded like we expected -- not as much as I was thinking.      Chemistry panel looks good except blood sugar (but is not fasting) ------

## 2013-02-01 ENCOUNTER — Encounter: Payer: Self-pay | Admitting: Cardiology

## 2013-02-01 NOTE — Assessment & Plan Note (Signed)
Workouts losing battle here.  He does not seem to be able to do any type of exercise.  He really needs dietary education with diet adjustment.  I will readdress the concept of dietary counseling on his followup visit.

## 2013-02-01 NOTE — Assessment & Plan Note (Signed)
Multifactorial with obesity, pulmonary dictation, grade 3 diastolic dysfunction, microvascular and macrovascular agent for CAD both large and small vessel, probably obesity ventilation syndrome.  Now exacerbated by a volume overload resulting from noncompliance with his regiment and use of NSAIDs.  Switch of NSAIDs and Tylenol.  If additional as needed he will need a stronger narcotic regimen.  He needs to see his primary physician for that.

## 2013-02-01 NOTE — Assessment & Plan Note (Addendum)
Ms. [name] is gotten much worse, he is very volume overloaded.  I am concerned that he may need it admitted to the hospital soon as we can get this down.  I told him a little bit for not following the clearly explained sliding-scale torsemide regimen that I gave him last visit -- he simply adjust his doses according to what he thinks is due and does not pay attention to his weight.  I really think that some of this weight gain could come from taking increased doses of NSAIDs.  Plan: Stop NSAIDs, take full dose, 100 mg Demadex for the next 3 days along with Zaroxolyn.  After that he can reduce to 50 mg Demadex every day until he lost 10 pounds.  He would use or a Zaroxolyn as well. A recheck a pro-BNP and CMP in roughly one week in order to adjust therapy prior to the weekend.  He will followup with Corine Shelter, [place] in roughly 2 weeks. I also told him his goes to the [place] [facility].

## 2013-02-01 NOTE — Assessment & Plan Note (Signed)
Very limited options.  Thankfully does not be proper and right now.  He still has his angina clearly made worse with volume overload.  Again is multifactorial with grade 3 diastolic dysfunction, microvascular disease very poor overall cardiac circulation.  This is combined with his hypoventilation syndrome.  He is intolerant of medications.  We have moderately stable dose of Toprol, and Imdur.  We may adjust his last visit.  As I mentioned if he continues having angina I would have to consider a narcotic regimen for breakthrough discomfort.

## 2013-02-01 NOTE — Assessment & Plan Note (Signed)
I am not sure if this is angina or just simply volume overload. See chronic angina above.

## 2013-02-01 NOTE — Assessment & Plan Note (Signed)
He clearly has obesity hyperventilation syndrome.  He really should be on CPAP, but he was unable to tolerate it.  His volume status is very important.  Weight loss of the great panel to this happening.  If he doesn't get him fitted for edema, my recommendation would be to consider Right Heart Catheterization simply to fully assess his opening pressures and volume status.

## 2013-02-01 NOTE — Assessment & Plan Note (Signed)
Again reiterated sliding scale Demadex.  We'll get to his episode then I will go back to the same sliding scale that recommended on his previous visit.  Sliding scale Demadex:  Weigh yourself Daily in the Morning.  Your dry weight should be 280-282 pounds.  For the next 3 days, take 100 mg daily with Zaroxolyn. This should get you down to her dry weight.  Continue to take 50 mg of Demadex daily after that  If you gain more than 3 pounds from dry weight: Increase the Demadex dosing to 100 mg in the morning until weight returns to baseline dry weight.  If weight gain is greater than 5 pounds in 2 days: Increased to Demadex to 100 mg plus Zaroxolyn, contact the office for further assistance if weight does not go down the next day.  If the weight goes down more than 3 pounds from dry weight: Hold Lasix until it returns to baseline dry weight

## 2013-02-01 NOTE — Assessment & Plan Note (Signed)
We'll need to check labs CMP, and pro BNP to evaluate his heart value or a while on torsemide and Zaroxolyn.

## 2013-02-08 ENCOUNTER — Other Ambulatory Visit: Payer: Self-pay | Admitting: Cardiology

## 2013-02-11 ENCOUNTER — Ambulatory Visit (INDEPENDENT_AMBULATORY_CARE_PROVIDER_SITE_OTHER): Payer: Medicare Other | Admitting: Cardiology

## 2013-02-11 ENCOUNTER — Telehealth: Payer: Self-pay | Admitting: *Deleted

## 2013-02-11 ENCOUNTER — Encounter: Payer: Self-pay | Admitting: Cardiology

## 2013-02-11 VITALS — BP 120/60 | HR 65 | Ht 70.0 in | Wt 291.0 lb

## 2013-02-11 DIAGNOSIS — M1710 Unilateral primary osteoarthritis, unspecified knee: Secondary | ICD-10-CM

## 2013-02-11 DIAGNOSIS — IMO0002 Reserved for concepts with insufficient information to code with codable children: Secondary | ICD-10-CM

## 2013-02-11 DIAGNOSIS — R079 Chest pain, unspecified: Secondary | ICD-10-CM

## 2013-02-11 DIAGNOSIS — M170 Bilateral primary osteoarthritis of knee: Secondary | ICD-10-CM

## 2013-02-11 DIAGNOSIS — N181 Chronic kidney disease, stage 1: Secondary | ICD-10-CM

## 2013-02-11 MED ORDER — TORSEMIDE 100 MG PO TABS: ORAL_TABLET | ORAL | Status: DC

## 2013-02-11 MED ORDER — TORSEMIDE 100 MG PO TABS: 100.0000 mg | ORAL_TABLET | Freq: Every day | ORAL | Status: DC

## 2013-02-11 MED ORDER — DICLOFENAC SODIUM 1 % TD GEL: 2.0000 g | Freq: Four times a day (QID) | TRANSDERMAL | Status: DC | PRN

## 2013-02-11 NOTE — Telephone Encounter (Signed)
Lab result will be given to patient at his appointment 02/11/13

## 2013-02-11 NOTE — Patient Instructions (Signed)
Follow up appt. In 6 weeks with Dr. Herbie Baltimore

## 2013-02-11 NOTE — Assessment & Plan Note (Signed)
Labile 

## 2013-02-11 NOTE — Assessment & Plan Note (Signed)
His BNP has been stable- less than 500

## 2013-02-11 NOTE — Telephone Encounter (Signed)
Message copied by Tobin Chad on Fri Feb 11, 2013  2:50 PM ------      Message from: Marykay Lex      Created: Tue Feb 08, 2013  5:37 PM       Labs look OK.              Marykay Lex, MD       ------

## 2013-02-11 NOTE — Progress Notes (Signed)
Lab reslt will be give at appointment on 02/11/13

## 2013-02-11 NOTE — Assessment & Plan Note (Signed)
He has lost 6 lbs since his LOV but his edema is returning since his Demedex has been cut back

## 2013-02-11 NOTE — Assessment & Plan Note (Signed)
He says he could not tolerate being off NASAIDs

## 2013-02-11 NOTE — Assessment & Plan Note (Addendum)
Last BUN/SCr 31/1.10

## 2013-02-11 NOTE — Progress Notes (Signed)
02/11/2013 [name]   May 10, 1945  [id number]  Primary Physicia LITTLE, Thereasa Solo, MD Primary Cardiologist: Dr Herbie Baltimore  HPI:  67 y/o former pt of Dr Clarene Duke with a history of CAD, DM, CRI, diastolic dysfunction, and chronic LE edema. See Dr Elissa Hefty note for a complete outline of his medical problems. He is in the office today for follow up. Dr Herbie Baltimore had increased his Demadex to 100 mg a day for three days. He tells he lost 7 lbs and his edema improved but has started to recur now that he is back on 50 mg daily.    Current Outpatient Prescriptions  Medication Sig Dispense Refill  . B Complex Vitamins (B COMPLEX PO) Take 1 capsule by mouth daily.      . Cholecalciferol (VITAMIN D-3) 1000 UNITS CAPS Take 1,000 Units by mouth 3 (three) times daily. Take 3 tablets daily      . Cinnamon 500 MG capsule Take 1,000 mg by mouth 2 (two) times daily. Plus Chromium 100mg       . clopidogrel (PLAVIX) 75 MG tablet Take 75 mg by mouth daily.      . Coenzyme Q10 (CO Q 10) 100 MG CAPS Take 100 mg by mouth 2 (two) times daily.       . Grape Seed 50 MG CAPS Take 50 mg by mouth daily.      Marland Kitchen Hyaluronic Acid-Vitamin C (HYALURONIC ACID PO) Take 100 mg by mouth daily as needed.      Marland Kitchen ibuprofen (ADVIL,MOTRIN) 200 MG tablet Take 200 mg by mouth every 8 (eight) hours as needed. For inflamation      . insulin aspart (NOVOLOG) 100 UNIT/ML injection Inject into the skin See admin instructions. 5 grams carbs = 1 unit,  11 gram protein = 1 unit,    10 points over 100 cbg = 1 unit      . insulin NPH (HUMULIN N,NOVOLIN N) 100 UNIT/ML injection Inject 35-40 Units into the skin 3 (three) times daily before meals.       . isosorbide mononitrate (IMDUR) 30 MG 24 hr tablet Take 15 mg by mouth 3 (three) times daily. Mostly 15mg  TID, occasionally takes 30mg       . lisinopril (PRINIVIL,ZESTRIL) 5 MG tablet Take 10 mg by mouth at bedtime.       . Magnesium Oxide 250 MG TABS Take 1 tablet by mouth daily.      . metolazone  (ZAROXOLYN) 5 MG tablet Take 5 mg by mouth daily as needed. For fluid      . metoprolol (LOPRESSOR) 50 MG tablet Take 50 mg by mouth 3 (three) times daily.      . nitroGLYCERIN (NITROSTAT) 0.4 MG SL tablet Place 0.4 mg under the tongue every 5 (five) minutes as needed. For chest pain      . potassium chloride (K-DUR,KLOR-CON) 10 MEQ tablet Take 40 meq three times a week and 30 meq four times a week.      . Probiotic Product (PROBIOTIC DAILY PO) Take 1 capsule by mouth every other day.      . Saw Palmetto, Serenoa repens, 450 MG CAPS Take 450 mg by mouth daily.      Marland Kitchen torsemide (DEMADEX) 100 MG tablet Take 50-100 mg by mouth daily as needed. For fluid retension      . Vitamin D, Ergocalciferol, (DRISDOL) 50000 UNITS CAPS capsule Take 50,000 Units by mouth daily.      . Vitamin D, Ergocalciferol, (DRISDOL) 50000 UNITS CAPS  Take 50,000 Units by mouth every 7 (seven) days. On Sunday      . diclofenac sodium (VOLTAREN) 1 % GEL Apply 2 g topically 4 (four) times daily as needed.  1 Tube  1  . torsemide (DEMADEX) 100 MG tablet Take 1 tablet (100 mg total) by mouth daily.  90 tablet  3   No current facility-administered medications for this visit.    Allergies  Allergen Reactions  . Ivp Dye [Iodinated Diagnostic Agents] Other (See Comments)    "found out in August 2012; I'm allergic to dye used in CT scans; causes lots of edema"  . Statins Other (See Comments)    Muscle ache; "rhabdomyolysis"  . Ranexa [Ranolazine]     Jittery,as well as constipation and bleeding hemorrhoids.  . Adhesive [Tape] Itching and Rash  . Penicillins Swelling and Rash    History   Social History  . Marital Status: Married    Spouse Name: N/A    Number of Children: N/A  . Years of Education: N/A   Occupational History  . Not on file.   Social History Main Topics  . Smoking status: Former Smoker -- 2.00 packs/day    Quit date: 03/19/1985  . Smokeless tobacco: Never Used  . Alcohol Use: Yes     Comment:  06/25/11 "rarely have a drink"  . Drug Use: No  . Sexual Activity: No   Other Topics Concern  . Not on file   Social History Narrative   Usually he comes in with a wheelchair. He is very happy walking today. Does not get much exercise due to that. He is hoping to do his water aerobics this summer.      Is a married father 50, grandfather to. The previous he started doing Silver sneakers but was really bothered by his knees. He been doing very well with weight loss but has altered somewhat with that..     Review of Systems: General: negative for chills, fever, night sweats or weight changes.  Cardiovascular: negative for chest pain, dyspnea on exertion, edema, orthopnea, palpitations, paroxysmal nocturnal dyspnea or shortness of breath Dermatological: negative for rash Respiratory: negative for cough or wheezing Urologic: negative for hematuria Abdominal: negative for nausea, vomiting, diarrhea, bright red blood per rectum, melena, or hematemesis Neurologic: negative for visual changes, syncope, or dizziness All other systems reviewed and are otherwise negative except as noted above.    Blood pressure 120/60, pulse 65, height 5\' 10"  (1.778 m), weight 291 lb (131.997 kg).  General appearance: alert, cooperative, no distress and moderately obese Lungs: clear to auscultation bilaterally Heart: regular rate and rhythm Extremities: chronic venous stasis with scatted ulcers.   ASSESSMENT AND PLAN:   Edema of both legs, chronic --  with with chronic venous stasis ulcers He has lost 6 lbs since his LOV but his edema is returning since his Demedex has been cut back  DM (diabetes mellitus) Labile  Diastolic dysfunction, grade 3 on Echo- March 2011 His BNP has been stable- less than 500  CKD (chronic kidney disease) stage 1, GFR 90 ml/min or greater Last BUN/SCr 31/1.10  Osteoarthritis of both knees, at times he must use a wheelchair. He says he could not tolerate being off  NASAIDs   PLAN  I suggested he take Demadex 100mg  M/W/F and 50 mg other days. He will get labs in a month and see Dr Herbie Baltimore in 6 weeks. I did explain the reasoning behind stopping his NSAID but he says he has severe  arthritis in his hands. I suggested we try Voltaren gel and see if he can limit oral NSAID.   [facility] 02/11/2013 3:53 PM

## 2013-03-25 ENCOUNTER — Telehealth: Payer: Self-pay | Admitting: Cardiology

## 2013-03-25 NOTE — Telephone Encounter (Signed)
Unsure of what orders needed.

## 2013-03-25 NOTE — Telephone Encounter (Signed)
Returned call to [employer].  No answer.  Will await return call.

## 2013-03-25 NOTE — Telephone Encounter (Signed)
Need new lab orders signed by Dr Herbie Baltimore.

## 2013-04-04 ENCOUNTER — Other Ambulatory Visit: Payer: Self-pay | Admitting: Cardiology

## 2013-04-04 NOTE — Telephone Encounter (Signed)
Rx was sent to pharmacy electronically. 

## 2013-04-21 ENCOUNTER — Ambulatory Visit (INDEPENDENT_AMBULATORY_CARE_PROVIDER_SITE_OTHER): Payer: Medicare Other | Admitting: Cardiology

## 2013-04-21 ENCOUNTER — Encounter: Payer: Self-pay | Admitting: Cardiology

## 2013-04-21 VITALS — BP 132/64 | Ht 70.0 in | Wt 286.9 lb

## 2013-04-21 DIAGNOSIS — R600 Localized edema: Secondary | ICD-10-CM

## 2013-04-21 DIAGNOSIS — I25810 Atherosclerosis of coronary artery bypass graft(s) without angina pectoris: Secondary | ICD-10-CM

## 2013-04-21 DIAGNOSIS — I5189 Other ill-defined heart diseases: Secondary | ICD-10-CM

## 2013-04-21 DIAGNOSIS — I2789 Other specified pulmonary heart diseases: Secondary | ICD-10-CM

## 2013-04-21 DIAGNOSIS — I208 Other forms of angina pectoris: Secondary | ICD-10-CM

## 2013-04-21 DIAGNOSIS — I272 Pulmonary hypertension, unspecified: Secondary | ICD-10-CM

## 2013-04-21 DIAGNOSIS — I209 Angina pectoris, unspecified: Secondary | ICD-10-CM

## 2013-04-21 DIAGNOSIS — Z9861 Coronary angioplasty status: Secondary | ICD-10-CM

## 2013-04-21 DIAGNOSIS — E119 Type 2 diabetes mellitus without complications: Secondary | ICD-10-CM

## 2013-04-21 DIAGNOSIS — E785 Hyperlipidemia, unspecified: Secondary | ICD-10-CM

## 2013-04-21 DIAGNOSIS — I519 Heart disease, unspecified: Secondary | ICD-10-CM

## 2013-04-21 DIAGNOSIS — R0609 Other forms of dyspnea: Secondary | ICD-10-CM

## 2013-04-21 DIAGNOSIS — I10 Essential (primary) hypertension: Secondary | ICD-10-CM

## 2013-04-21 DIAGNOSIS — Z955 Presence of coronary angioplasty implant and graft: Secondary | ICD-10-CM

## 2013-04-21 DIAGNOSIS — R609 Edema, unspecified: Secondary | ICD-10-CM

## 2013-04-21 DIAGNOSIS — Z951 Presence of aortocoronary bypass graft: Secondary | ICD-10-CM

## 2013-04-21 NOTE — Progress Notes (Signed)
PCP: [name], [name], MD  Clinic Note: Chief Complaint  Patient presents with  . 3 monh visit    little bit at night mostly chest discomfort, "I'HAVE BEEN DOING MUCH BETTER LATELY, SOB FROM TIME TO TIME, EDEMA PRETTY GOOD NOW,,,BLISTERS  /ULCER ON LEG  ;episodes decrease bloodpressur.with an aura    HPI: [name] is a 67 y.o. male with a Cardiovascular Problem List below who presents today for routine followup. He was seen by [facility] in October in doing relatively well.  Interval History: He tells me that he is having the best 2 months that he has had quite some time. He still has his low STEMI sores in various stages of healing and has been referred to wound care. He says his angina is less frequent than it had been. He only notes on occasion, not every day like he used to. He is 10 pounds down from last time I saw him and overall feeling much better. He is tolerating his current regimen. He has his baseline orthopnea, but is stable with nighttime oxygen. He has had less PND and less overall edema. He is now using his torsemide and metolazone based on body weight change. He denies any rapid heartbeats or palpitations consistent with arrhythmia. No lightheadedness, dizziness or wooziness. No CP/near-syncope. No TIA or amaurosis fugax symptoms. He has mild blood when wiping but no gross hematochezia or melena. No hematuria or nosebleeds. He denies any claudication symptoms, but does not do much walking  Patient Active Problem List   Diagnosis Date Noted  . CABG '95 with redo '08- s/p multiple PCIs since 06/25/2011    Priority: High  . Stable angina, chronic - it is usually occurring at night but also with moderate exertion 06/25/2011    Priority: High  . Pulmonary HTN, with nighttime oxygen 06/25/2011    Priority: High  . Diastolic dysfunction, grade 3 on Echo- March 2011 06/25/2011    Priority: High  . Morbid obesity - with likely obesity hypoventilation syndrome 10/09/2012     Priority: Medium  . DM (diabetes mellitus) 06/25/2011    Priority: Medium  . HTN (hypertension) 06/25/2011    Priority: Medium  . Status post coronary artery stent placement, 06/25/11 in distal SVG to OM for instent restenosis 90% to 0% with Promus DES 06/25/2011    Priority: Medium  . Dyslipidemia, with intol to statins 06/25/2011    Priority: Medium  . Osteoarthritis of both knees, at times he must use a wheelchair. 06/25/2011    Priority: Medium  . Venous stasis ulcers 06/25/2011    Priority: Medium    Class: Chronic  . S/P CABG x 4 04/23/2013  . DOE (dyspnea on exertion) 02/01/2013  . CKD (chronic kidney disease) stage 1, GFR 90 ml/min or greater 06/25/2011  . Contrast dye induced nephropathy, history of in setting of sepsis and dehydration 06/25/2011     PMH Reviewed on Epic Prior Cardiac Evaluation and Past Surgical History: Past Surgical History  Procedure Laterality Date  . Coronary angioplasty with stent placement  April 2011    Distal SVG to OM -- Promus DES 3.0 mm 23 mm  . Tonsillectomy and adenoidectomy      "as a kid"  . Appendectomy      "as a kid"  . Coronary angioplasty with stent placement  03/08/2010    Repeat PCI space--complex procedure using Guideliner: 2 site PCI SVG to OM1-OM 2: 2.75 mm 12 mm Promus DDS distally, 3.5 mm  20 mm proximally.  . Coronary angioplasty with stent placement  06/25/2011    PCI to the very distal segment/anastomotic lesion of the SVG-OM1-OM 2. 2.75 mm 12 mm probes DES stent OM1 all but occluded/atretic. OM 2 widely patent.  . Coronary artery bypass graft  1996    CABG X4; LIMA-LAD, SVG-diagonal, SVG-OM1-L2, SVG-distal RCA.  Marland Kitchen Coronary artery bypass graft  2008    CABG X2; free radial from the would've SVG-OM 2 distal OM, and redo SVG-RCA -- both grafts occluded several months later. Time down for third redo CABG  . Cardiac catheterization  08/10/2009    Distal SVG 90% stenosis, stented with a 3.0x38mm Promus stent resulting in brisk  TIMI III flow  . Cardiac catheterization  10/02/2006    Medical management  . Cardiac catheterization  05/13/2006    Recommended CABG  . Cardiac catheterization  06/06/1994    Severe native CAD, widely patent SVG grafts, normal LV systolic function  . Nuclear stress test  01/21/2002    Resting ECG demonstrates normal sinus rhythm, stress ECG non-diagnostic for ischemia]  . 2d echocardiogram  03/04/2012    EF 50-55%, normal-mild    Allergies  Allergen Reactions  . Ivp Dye [Iodinated Diagnostic Agents] Other (See Comments)    "found out in August 2012; I'm allergic to dye used in CT scans; causes lots of edema"  . Statins Other (See Comments)    Muscle ache; "rhabdomyolysis"  . Ranexa [Ranolazine]     Jittery,as well as constipation and bleeding hemorrhoids.  . Adhesive [Tape] Itching and Rash  . Penicillins Swelling and Rash    MEDICATIONS REVIEWED IN EPIC  History   Social History Narrative   Usually he comes in with a wheelchair. He is very happy walking today. Does not get much exercise due to that. He is hoping to do his water aerobics this summer.      Is a married father 47, grandfather to. The previous he started doing Silver sneakers but was really bothered by his knees. He been doing very well with weight loss but has altered somewhat with that..      Primary care provider is Dr. Mardi Mainland, [facility].  Phone number: [phone]   ROS: A comprehensive Review of Systems - Negative except Mild orthopnea and PND that is stable. Supine angina improved on nighttime oxygen.Marland Kitchen labile blood sugars have improved. He remains stable with chronic arthritis pains. Persistent venous stasis dermatitis with ulcers of various staging of healing.  PHYSICAL EXAM BP 132/64  Ht 5\' 10"  (1.778 m)  Wt 286 lb 14.4 oz (130.137 kg)  BMI 41.17 kg/m2 General appearance: alert, cooperative, appears stated age, morbidly obese and Normal mood and affect.  Neck: JVD - 3-4 cm above  sternal notch, no adenopathy, supple, symmetrical, trachea midline, thyroid not enlarged, symmetric, no tenderness/mass/nodules and Left greater than right carotid bruit  HEENT: Wears glasses, [place]/AT, EOMI, MMM, anicteric sclera. Mild right eyelid cholesteatoma Lungs: Mostly CTAB with mild diminished sounds - no rales, wheezes or rhonchi.. Normal percussion bilaterally and Nonlabored, just diminished throughout due to body habitus.  Heart: RRR, S1, S2 normal but distant, S4 present; Unable to palpate apical impulse . No other murmurs or rubs and noted to be significant.  Abdomen: Grossly obese, unable to palpate any HSM. Cannot tell if there is edema or ascites.  Extremities: edema 1+ bilaterally, venous stasis dermatitis noted and With diffuse worsening stasis ulcers in various stages of healing  Pulses: Diminished low short  pulses of the 2/2 edema.  Skin: Brawny/purple hemosiderin staining, venous stasis changes. See extremities above  Neurologic: Mental status: Alert, oriented, thought content appropriate, affect: mood-congruent   [id number] today: Yes Rate: 64 , Rhythm: Normal sinus rhythm, Incomplete Right Bundle Branch Block otherwise normal  Recent Labs:   Last pro BNP from September was 480 that is probably his chronic level. Otherwise no new labs.  ASSESSMENT / PLAN: Stable angina, chronic - it is usually occurring at night but also with moderate exertion Cc redoing as well as his ever done. He states he is feeling better now than he has for years. He hasn't pretty good regimen that he is using as comfortable with. He is on good medical therapy with stable dose of Toprol and Imdur. He is not requiring a narcotic at this time. He remains on Plavix without aspirin. Unfortunately he is intolerant of Ranexa.  Status post coronary artery stent placement, 06/25/11 in distal SVG to OM for instent restenosis 90% to 0% with Promus DES His last PCI was quite complicated PCI to a vein graft. As  long as his angina is chronic and stable, I am not inclined to reassess with routine screening stress test. If his symptoms were worsening, I think that the best option would be to skip the stress test and go to invasive evaluation, although his interventional options are limited. For now we'll just continue with current regimen.  S/P CABG x 4 No further surgical revascularizations options are available.  Pulmonary HTN, with nighttime oxygen I'm sure that the pulmonary chest is a combination of chronic diastolic heart failure as well as obesity hypoventilation syndrome. He is wearing nighttime oxygen and seems to be doing relatively well with that. He has been reluctant to try to do a sleep study in the past.  For now since he is doing well, we'll just simply continue with nighttime oxygen taking uses when necessary for exertional dyspnea.  Diastolic dysfunction, grade 3 on Echo- March 2011 Likely combination of long-standing hypertension and chronic ischemic heart disease. This could be one cause of his chronic stable angina at this point with existing small vessel disease non-amenable to PCI.  Simply treating his blood pressure and volume levels is the mainstay of treatment.  Venous stasis ulcers He is now close to target body weight, down 10 pounds from when I saw him in September. He is back to a stable dose of Demadex and metolazone. He continues to use a sliding scale.  Currently his edema level is stable, but he still has several ulcers of varying stages of healing. He basically doesn't have any epidermal tissue on his legs.   He does not wear compression stockings or support hose simply because they stick to his lesions. There are no signs of active infection.  He has been referred to wound care through the [place].  HTN (hypertension) Well-controlled on current regimen. We'll need to continue to monitor he declines, and consider increasing afterload reduction/ACE  inhibitor.  Dyslipidemia, with intol to statins Currently limited to therapeutic options. He is on coenzyme Q 10 and fish oil. Hopefully when the PSK 9 medication is available he will be able to tolerate this medication.  He says that he is hoping to get into a workout routine. Maybe this will help burn calories and lower his lipid levels.  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 He is currently  not having to use when necessary oxygen for walking. I do think it may be good for him to do so if he is not doing significant exertion.    Morbid obesity - with likely obesity hypoventilation syndrome Now that he is feeling better, for reason to not 100% sure, I think he is showing interest in trying to get back into doing exercise with various exercise machines. I stressed to him the importance of doing cardiovascular type exercises and not muscle mass building exercises. The goal here is to lose weight and increased muscle tone. Plan that they could manage of his desire in interest in weight loss.    Orders Placed This Encounter  Procedures  . EKG 12-Lead   refills:  Meds ordered this encounter  Medications  . lisinopril (PRINIVIL,ZESTRIL) 10 MG tablet    Sig: TAKE 1/2 TABLET BY MOUTH DAILY  . potassium chloride SA (K-DUR,KLOR-CON) 20 MEQ tablet    Sig: Take 40 meq 3 times a week,and 30 meq 4 times aweek   Due to the multiple chronic medical conditions following, his routine visits last close to 45 minutes despite the 15 minute scheduled slot. His documentation takes at least 15-20 minutes  Followup: 4-6 months  DAVID W. Herbie Baltimore, M.D., M.S. [facility] [address] [place], [place]  [address]  [phone] Pager # [phone]

## 2013-04-21 NOTE — Patient Instructions (Signed)
No medication changes made today.  Dr Herbie Baltimore agrees and thinks you should have wound care done at the [facility].  Dr. Herbie Baltimore wants you to follow-up in 4-6 months. You will receive a reminder letter in the mail two months in advance. If you don't receive a letter, please call our office to schedule the follow-up appointment.

## 2013-04-22 ENCOUNTER — Encounter: Payer: Self-pay | Admitting: Cardiology

## 2013-04-23 ENCOUNTER — Encounter: Payer: Self-pay | Admitting: Cardiology

## 2013-04-23 DIAGNOSIS — Z951 Presence of aortocoronary bypass graft: Secondary | ICD-10-CM | POA: Insufficient documentation

## 2013-04-23 NOTE — Assessment & Plan Note (Addendum)
Likely combination of long-standing hypertension and chronic ischemic heart disease. This could be one cause of his chronic stable angina at this point with existing small vessel disease non-amenable to PCI.  Simply treating his blood pressure and volume levels is the mainstay of treatment.

## 2013-04-23 NOTE — Assessment & Plan Note (Signed)
Now that he is feeling better, for reason to not 100% sure, I think he is showing interest in trying to get back into doing exercise with various exercise machines. I stressed to him the importance of doing cardiovascular type exercises and not muscle mass building exercises. The goal here is to lose weight and increased muscle tone. Plan that they could manage of his desire in interest in weight loss.

## 2013-04-23 NOTE — Assessment & Plan Note (Signed)
Well-controlled on current regimen. We'll need to continue to monitor he declines, and consider increasing afterload reduction/ACE inhibitor.

## 2013-04-23 NOTE — Assessment & Plan Note (Signed)
I'm sure that the pulmonary chest is a combination of chronic diastolic heart failure as well as obesity hypoventilation syndrome. He is wearing nighttime oxygen and seems to be doing relatively well with that. He has been reluctant to try to do a sleep study in the past.  For now since he is doing well, we'll just simply continue with nighttime oxygen taking uses when necessary for exertional dyspnea.

## 2013-04-23 NOTE — Assessment & Plan Note (Signed)
No further surgical revascularizations options are available.

## 2013-04-23 NOTE — Assessment & Plan Note (Signed)
He is now close to target body weight, down 10 pounds from when I saw him in September. He is back to a stable dose of Demadex and metolazone. He continues to use a sliding scale.  Currently his edema level is stable, but he still has several ulcers of varying stages of healing. He basically doesn't have any epidermal tissue on his legs.   He does not wear compression stockings or support hose simply because they stick to his lesions. There are no signs of active infection.  He has been referred to wound care through the [place].

## 2013-04-23 NOTE — Assessment & Plan Note (Signed)
His last PCI was quite complicated PCI to a vein graft. As long as his angina is chronic and stable, I am not inclined to reassess with routine screening stress test. If his symptoms were worsening, I think that the best option would be to skip the stress test and go to invasive evaluation, although his interventional options are limited. For now we'll just continue with current regimen.

## 2013-04-23 NOTE — Assessment & Plan Note (Addendum)
Cc redoing as well as his ever done. He states he is feeling better now than he has for years. He hasn't pretty good regimen that he is using as comfortable with. He is on good medical therapy with stable dose of Toprol and Imdur. He is not requiring a narcotic at this time. He remains on Plavix without aspirin. Unfortunately he is intolerant of Ranexa.

## 2013-04-23 NOTE — Assessment & Plan Note (Signed)
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 He is currently not having to use when necessary oxygen for walking. I do think it may be good for him to do so if he is not doing significant exertion.

## 2013-04-23 NOTE — Assessment & Plan Note (Signed)
Currently limited to therapeutic options. He is on coenzyme Q 10 and fish oil. Hopefully when the PSK 9 medication is available he will be able to tolerate this medication.  He says that he is hoping to get into a workout routine. Maybe this will help burn calories and lower his lipid levels.

## 2013-08-26 ENCOUNTER — Encounter: Payer: Self-pay | Admitting: Cardiology

## 2013-08-26 ENCOUNTER — Ambulatory Visit (INDEPENDENT_AMBULATORY_CARE_PROVIDER_SITE_OTHER): Payer: Medicare Other | Admitting: Cardiology

## 2013-08-26 VITALS — BP 126/68 | HR 65 | Ht 70.0 in | Wt 293.5 lb

## 2013-08-26 DIAGNOSIS — I83009 Varicose veins of unspecified lower extremity with ulcer of unspecified site: Secondary | ICD-10-CM

## 2013-08-26 DIAGNOSIS — I25810 Atherosclerosis of coronary artery bypass graft(s) without angina pectoris: Secondary | ICD-10-CM

## 2013-08-26 DIAGNOSIS — I519 Heart disease, unspecified: Secondary | ICD-10-CM

## 2013-08-26 DIAGNOSIS — R0609 Other forms of dyspnea: Secondary | ICD-10-CM

## 2013-08-26 DIAGNOSIS — Z9861 Coronary angioplasty status: Secondary | ICD-10-CM

## 2013-08-26 DIAGNOSIS — I208 Other forms of angina pectoris: Secondary | ICD-10-CM

## 2013-08-26 DIAGNOSIS — E785 Hyperlipidemia, unspecified: Secondary | ICD-10-CM

## 2013-08-26 DIAGNOSIS — I10 Essential (primary) hypertension: Secondary | ICD-10-CM

## 2013-08-26 DIAGNOSIS — I209 Angina pectoris, unspecified: Secondary | ICD-10-CM

## 2013-08-26 DIAGNOSIS — I5189 Other ill-defined heart diseases: Secondary | ICD-10-CM

## 2013-08-26 DIAGNOSIS — L97909 Non-pressure chronic ulcer of unspecified part of unspecified lower leg with unspecified severity: Secondary | ICD-10-CM

## 2013-08-26 DIAGNOSIS — Z955 Presence of coronary angioplasty implant and graft: Secondary | ICD-10-CM

## 2013-08-26 DIAGNOSIS — I2789 Other specified pulmonary heart diseases: Secondary | ICD-10-CM

## 2013-08-26 DIAGNOSIS — I272 Pulmonary hypertension, unspecified: Secondary | ICD-10-CM

## 2013-08-26 DIAGNOSIS — R0989 Other specified symptoms and signs involving the circulatory and respiratory systems: Secondary | ICD-10-CM

## 2013-08-26 DIAGNOSIS — Z951 Presence of aortocoronary bypass graft: Secondary | ICD-10-CM

## 2013-08-26 NOTE — Patient Instructions (Signed)
May try to take metoprolol twice a day  from 3 times a day  Your physician wants you to follow-up in 6 months Dr Antony ContrasHarding--30 min appointment.  You will receive a reminder letter in the mail two months in advance. If you don't receive a letter, please call our office to schedule the follow-up appointment.

## 2013-08-27 NOTE — Assessment & Plan Note (Signed)
Continues on coenzyme Q10 and fish oil. Awaiting new antilipid medication release.

## 2013-08-27 NOTE — Assessment & Plan Note (Signed)
Relatively intolerant of Myoview stress tests. Stable anginal symptoms with minimal percutaneous options in the past. Would only consider recatheterization for severe/intractable symptoms. Also concern of prior history of contrast nephropathy. On dual antiplatelet, beta blocker, ACE inhibitor and long-acting nitrate. Statin intolerant, with poor lipid control without occasions.

## 2013-08-27 NOTE — Assessment & Plan Note (Signed)
Continue stable diuresis. Wound care per recommendations from wound consult. It will be nice to get these lesions stable so he can go back to wearing compression stockings. [facility].

## 2013-08-27 NOTE — Assessment & Plan Note (Signed)
Well-controlled. I would like to increase his ACE inhibitor, but we'll leave things as is because he is symptomatically doing better.

## 2013-08-27 NOTE — Assessment & Plan Note (Signed)
Stable from a heart failure standpoint. On high-dose torsemide that he is taking on sliding scale basis along metolazone.  On beta blocker and ACE inhibitor. He is very difficult to titrate medications on.  I will simply keep his medications as he is since he is doing fine.

## 2013-08-27 NOTE — Progress Notes (Signed)
PCP: LITTLE, [name], MD  Clinic Note: Chief Complaint  Patient presents with  . 4 months visit     chest pain only when laying down to go to bed, ob with exertion, some  edema --ulcers   HPI: [name] is a 68 y.o. male with a Cardiovascular Problem List below who presents today for 8-month followup of complex CAD with chronic stable angina, peripheral vascular disease, peripheral venous stasis disease and chronic edema..  Interval History: [name] presents today again feeling about as well as he has for some time. He is really noticing less and less of his nocturnal angina. He has increased his CoQ 10 up to 400 mg a day taking 100 mg 4 times a day, which is tolerated much better. Since doing that he feels significantly better. He has actually cut back his metoprolol to twice a day dosing from 3 times a day on occasion and not noted any change in symptoms. He still gets some exertional angina if he overdoes it. This also associated with dyspnea. But more more he is limited by his hips and knees later arthritis than the actual cardiac symptoms. As usual, he still has nocturnal dyspnea and angina, but this is also less noticeable. Not occurring every night. He does still have orthopnea, using several pillows, less PND. He is actually now using less torsemide and he had. With an additional dose being a more when necessary then standing. His edema it still comes and goes, and he still has recurrent stasis ulcers of various stages of healing. He has a stable treatment regimen for these lesions based on prior wound care consultations.  He denies any rapid or irregular heartbeats, palpitations. No lightheadedness, dizziness, weakness, syncope/near-syncope. No TIA or amaurosis fugax symptoms. No melena, hematochezia, hematuria, or epistaxis. No claudication with the extent of walking he does.  Unfortunately, he has gained back some of the weight he lost previously, stating that he really is  unable do much the way of exercise. He plans to pull at his "[employer] "and start using it.    Past Medical History  Diagnosis Date  . Hypertension   . High cholesterol   . Carotid stenosis, right   . Myocardial infarction     "total of ~ 15; 3 landed me in the hospital"  . Angina, chronic stable     Likely combination of small vessel disease as well as grade 3 diastolic dysfunction  . GERD (gastroesophageal reflux disease)   . SOB (shortness of breath) on exertion     "w/angina, when lying down"  . 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 Arthritis     "hands & legs; knees"  . History of acute renal failure 12/2010    S/P "cillin and/or dye from CT scan"  . Pulmonary HTN, with nighttime oxygen 06/25/2011    "at night when I sleep & sometimes during the day; for my angina"  . DM (diabetes mellitus) 06/25/2011  . CKD (chronic kidney disease) stage 2, GFR 60-89 ml/min 06/25/2011  . Contrast dye induced nephropathy, history of in setting of sepsis and dehydration 06/25/2011    History of  . Diastolic dysfunction, grade 3 on Echo 06/25/2011    Preserved ejection fraction of 50-55% with mild inferior hypokinesis. Sclerotic aortic valve.  Marland Kitchen HTN (hypertension) 06/25/2011  . Status post coronary artery stent placement, 06/25/11 in distal SVG to OM for instent restenosis 90% to 0% with Promus DES 06/25/2011  .  Dyslipidemia, with allery to statins 06/25/2011    Also intolerant of fenofibrate  . Osteoarthritis of both knees, at times he must use a wheelchair. 06/25/2011  . Edema of both legs, history of and chronic 06/25/2011    Chronic venous stasis ulcers making it difficult to wear compression stockings.  . Obesity hypoventilation syndrome   . CAD (coronary artery disease), native coronary artery 1995    Initial CABG 1985, redo CABG 2008  . CAD (coronary artery disease), CABG in 1995 and Re-do CABG 2008 06/25/2011  . CAD (coronary artery disease) of bypass graft, all  VG from 2008 CABG are occluded, he [facility] 06/25/2011    Does not do well with Myoview stress test  . Bruit     CAROTID DOPPLER, 07/29/2012 - R ICA-0-49% diameter reduction, L Bulb/Proximal ICA-50-69% diameter reduction  . Bilateral diabetic retinopathy    Outpatient Encounter Prescriptions as of 08/26/2013  Medication Sig  . B Complex Vitamins (B COMPLEX PO) Take 2 capsules by mouth daily.   . Cholecalciferol (VITAMIN D-3) 1000 UNITS CAPS Take 3,000 Units by mouth daily.   Marland Kitchen. CINNAMON PO Take 2,000 mg by mouth 2 (two) times daily. With chrondtin  . clopidogrel (PLAVIX) 75 MG tablet Take 75 mg by mouth daily.  . Coenzyme Q10 (CO Q 10) 100 MG CAPS Take 100 mg by mouth 4 (four) times daily.   . diclofenac sodium (VOLTAREN) 1 % GEL Apply 2 g topically 4 (four) times daily as needed.  . Grape Seed 50 MG CAPS Take 50 mg by mouth daily.  Marland Kitchen. Hyaluronic Acid-Vitamin C (HYALURONIC ACID PO) Take 100 mg by mouth. TAKE 1 CAPSULE 5 TIMES A WEEK  . ibuprofen (ADVIL,MOTRIN) 200 MG tablet Take 200 mg by mouth every 8 (eight) hours as needed. For inflamation  . insulin aspart (NOVOLOG) 100 UNIT/ML injection Inject into the skin See admin instructions. 5 grams carbs = 1 unit,  11 gram protein = 1 unit,    10 points over 100 cbg = 1 unit  . insulin NPH (HUMULIN N,NOVOLIN N) 100 UNIT/ML injection Inject 35-40 Units into the skin 3 (three) times daily before meals.   . isosorbide mononitrate (IMDUR) 30 MG 24 hr tablet Take 15 mg by mouth 3 (three) times daily. Mostly 15mg  TID, occasionally takes 30mg   . lisinopril (PRINIVIL,ZESTRIL) 10 MG tablet TAKE 1/2 TABLET BY MOUTH DAILY  . Magnesium Oxide 250 MG TABS Take 1 tablet by mouth daily.  . metolazone (ZAROXOLYN) 5 MG tablet Take 5 mg by mouth daily. For fluid  . metoprolol (LOPRESSOR) 50 MG tablet Take 1 tablet (50 mg total) by mouth 3 (three) times daily.  . nitroGLYCERIN (NITROSTAT) 0.4 MG SL tablet Place  0.4 mg under the tongue every 5 (five) minutes as needed. For chest pain  . potassium chloride SA (K-DUR,KLOR-CON) 20 MEQ tablet Take 40 meq 3 times a week,and 30 meq 4 times aweek  . Saw Palmetto, Serenoa repens, 450 MG CAPS Take 450 mg by mouth daily.  Marland Kitchen. torsemide (DEMADEX) 100 MG tablet Take 50-100 mg by mouth daily as needed. For fluid retension  . [DISCONTINUED] Cinnamon 500 MG capsule Take 1,000 mg by mouth 2 (two) times daily. Plus Chromium 100mg     Prior Cardiac Evaluation and Past Surgical History: Reviewed & Updated in Epic ALLERGIES REVIEWED IN EPIC No Change in Social and Family History  ROS: A comprehensive Review of Systems - Negative except  Preventative noted above. He is now seeming to be more limited by arthritis type pains in his hips and knees. Otherwise negative.  PHYSICAL EXAM BP 126/68  Pulse 65  Ht 5\' 10"  (1.778 m)  Wt 293 lb 8 oz (133.131 kg)  BMI 42.11 kg/m2 General appearance: alert, cooperative, appears stated age, morbidly obese and Normal mood and affect.  Neck: JVD - 3-4 cm above sternal notch, no adenopathy, supple, symmetrical, trachea midline, thyroid not enlarged, symmetric, no tenderness/mass/nodules and Left greater than right carotid bruit  HEENT: Wears glasses, [place]/AT, EOMI, MMM, anicteric sclera. Mild right eyelid cholesteatoma  Lungs: Mostly CTAB with mild diminished sounds; no W./R./R. Normal percussion bilaterally and Nonlabored, just diminished throughout due to body habitus.  Heart: RRR, S1, S2 normal but distant, S4 present; Unable to palpate apical impulse . No other murmurs or rubs and noted to be significant.  Abdomen: Grossly obese, unable to palpate any HSM. Unable to determine edema or ascites.  Extremities: edema 1+ bilaterally, venous stasis dermatitis noted and With diffuse worsening stasis ulcers in various stages of healing; several lesions on both lower extremities with dressings in place to Pulses: Diminished low short pulses of the  2/2 edema.  Skin: Brawny/purple hemosiderin staining, venous stasis changes. See extremities above  Neurologic: Mental status: Alert, oriented, thought content appropriate, affect: mood-congruent    Adult ECG Report  Rate: 65 ;  Rhythm: normal sinus rhythm  QRS Axis: 5 ;  PR Interval: 198 ;  QRS Duration: 104 ; QTc: 440  Voltages: Normal;   Conduction Disturbances: Incomplete right bundle-branch block  Other Abnormalities: Mild nonspecific changes.  Narrative Interpretation: Stable EKG with normal sinus rhythm and incomplete right bundle branch block. No significant change from last tracing  Recent Labs: Provided by PCP with normal renal function. No lipids  Sodium 137, potassium 3.8, chloride 101, bicarbonate 31, BUN 25, creatinine 1.1, glucose 188, calcium 9.5   ASSESSMENT / PLAN: Overall, stable. Feels the best he has in a while. No inclination to make any major adjustments for now.  Future plans would include increasing ACE inhibitor if blood pressure were to rise, consider adding spironolactone as well the future. [name] continues to be very complex and difficult to manage. He has very poignant opinions on how he wants to take care of things, and is reluctant to make changes when feeling better. A minimum of 30-35 minutes is spent with the patient for each encounter. Overall time the patient and chart is well over 40 minutes. Over half the time spent with counseling.  Stable angina, chronic - it is usually occurring at night but also with moderate exertion Stable on current regimen. Unfortunately unable to tolerate Ranexa. On moderate dose of Imdur. He would like to back off on the metoprolol dosing to twice a day. I'm fine with trying that to see how he does, but if his symptoms worsen he should go back to 3 times a day. Continue aspirin plus Plavix. I did encourage increased activity to see how well he does.  CAD: CABG '95 with redo '08- s/p multiple PCIs since Relatively  intolerant of Myoview stress tests. Stable anginal symptoms with minimal percutaneous options in the past. Would only consider recatheterization for severe/intractable symptoms. Also concern of prior history of contrast nephropathy. On dual antiplatelet, beta blocker, ACE inhibitor and long-acting nitrate. Statin intolerant, with poor lipid control without occasions.   Status post coronary artery stent placement, 06/25/11 in distal SVG to OM for instent restenosis 90% to  0% with Promus DES Very complicated PCI procedures in the past. The plan for now would be to try to avoid invasive procedures unless symptoms are intractable.  Diastolic dysfunction, grade 3 on Echo- March 2011 Stable from a heart failure standpoint. On high-dose torsemide that he is taking on sliding scale basis along metolazone.  On beta blocker and ACE inhibitor. He is very difficult to titrate medications on.  I will simply keep his medications as he is since he is doing fine.  Pulmonary HTN, with nighttime oxygen Combination of diastolic failure as well as likely obesity hypoventilation syndrome. He tells me still wearing his nighttime oxygen but not as much.  HTN (hypertension) Well-controlled. I would like to increase his ACE inhibitor, but we'll leave things as is because he is symptomatically doing better.  Venous stasis ulcers Continue stable diuresis. Wound care per recommendations from wound consult. It will be nice to get these lesions stable so he can go back to wearing compression stockings. [facility]  Dyslipidemia, with intol to statins Continues on coenzyme Q10 and fish oil. Awaiting new antilipid medication release.  Morbid obesity - with likely obesity hypoventilation syndrome He tells me his good target I can do an exercise exercises he was doing seems more power but lifting related. I recommended that he do it wraps with less intensity from weight perspective. His history is the total  gamma and I recommended using less of an incline it offers less resistance in doing more repetitions to increase his heart rate for cardiovascular endurance. He also tells me that he is closely watching his diet.    Orders Placed This Encounter  Procedures  . EKG 12-Lead   Meds ordered this encounter  Medications  . CINNAMON PO    Sig: Take 2,000 mg by mouth 2 (two) times daily. With chrondtin    Followup: 4-6 months  DAVID W. Herbie BaltimoreHARDING, M.D., M.S. Interventional Cardiologist [facility]

## 2013-08-27 NOTE — Assessment & Plan Note (Signed)
Stable on current regimen. Unfortunately unable to tolerate Ranexa. On moderate dose of Imdur. He would like to back off on the metoprolol dosing to twice a day. I'm fine with trying that to see how he does, but if his symptoms worsen he should go back to 3 times a day. Continue aspirin plus Plavix. I did encourage increased activity to see how well he does.

## 2013-08-27 NOTE — Assessment & Plan Note (Signed)
Very complicated PCI procedures in the past. The plan for now would be to try to avoid invasive procedures unless symptoms are intractable.

## 2013-08-27 NOTE — Assessment & Plan Note (Signed)
He tells me his good target I can do an exercise exercises he was doing seems more power but lifting related. I recommended that he do it wraps with less intensity from weight perspective. His history is the total gamma and I recommended using less of an incline it offers less resistance in doing more repetitions to increase his heart rate for cardiovascular endurance. He also tells me that he is closely watching his diet.

## 2013-08-27 NOTE — Assessment & Plan Note (Signed)
Combination of diastolic failure as well as likely obesity hypoventilation syndrome. He tells me still wearing his nighttime oxygen but not as much.

## 2013-08-30 ENCOUNTER — Telehealth: Payer: Self-pay | Admitting: *Deleted

## 2013-08-30 NOTE — Telephone Encounter (Signed)
FAXED OFFICE NOTE FROM 08/26/2013

## 2013-09-01 ENCOUNTER — Telehealth: Payer: Self-pay | Admitting: Cardiology

## 2013-09-01 NOTE — Telephone Encounter (Signed)
Nettie ElmSylvia - We cannot accept e-mails.  The form will need to be hand delivered or faxed.  Please inform.

## 2013-09-01 NOTE — Telephone Encounter (Signed)
Wife wants to know if she can e-mail an application for his permanent disability parking placard please?

## 2013-09-08 ENCOUNTER — Telehealth: Payer: Self-pay | Admitting: Cardiology

## 2013-09-08 NOTE — Telephone Encounter (Signed)
She was calling to remind you about the paper for [place]. Please fax [phone].

## 2013-09-09 NOTE — Telephone Encounter (Signed)
Pt wife called again today,she is still waiting for the form.

## 2013-09-09 NOTE — Telephone Encounter (Signed)
Faxed information earlier today to wife 's faxed number

## 2013-09-09 NOTE — Telephone Encounter (Signed)
[name] has already been notified.  Will defer.

## 2013-11-03 ENCOUNTER — Telehealth: Payer: Self-pay | Admitting: *Deleted

## 2013-11-03 NOTE — Telephone Encounter (Signed)
Mailed , renewal permanent disability application,  signed by Dr Herbie BaltimoreHARDING

## 2014-02-16 ENCOUNTER — Telehealth: Payer: Self-pay | Admitting: Cardiology

## 2014-02-17 ENCOUNTER — Telehealth: Payer: Self-pay

## 2014-02-17 NOTE — Telephone Encounter (Signed)
Patient's wife walked in office this morning requesting a call back regarding appointment with Dr.Harding.Returned call to patient's wife she stated she received a phone call yesterday needing to cancel 02/23/14 appointment with Dr.Harding.Stated appointment was rescheduled to 02/24/14 at 9:00 AM.Stated husband can't come in mornings.Stated he will have to have a Thurs or Fri afternoon.Stated she was told Dr.Harding does not have any openings until 05/2014 and he cannot wait that long.Wife upset and states husband needs to be seen.Stated she will take any afternoon appointment.Appointment scheduled with Dr.Harding Tues 02/28/14 at 4:15 pm.

## 2014-02-17 NOTE — Telephone Encounter (Signed)
Close encounter 

## 2014-02-17 NOTE — Telephone Encounter (Signed)
OK..  Anastacia Reinecke W, MD  

## 2014-02-20 ENCOUNTER — Encounter: Payer: Self-pay | Admitting: *Deleted

## 2014-02-20 ENCOUNTER — Telehealth: Payer: Self-pay | Admitting: Cardiology

## 2014-02-20 NOTE — Telephone Encounter (Signed)
No need for clearance. No need for pre-meds. They use Lidocaine with Epi to keep the lidocaine effect locally. -- I is not intended to be an IV therapy.  Only if more than conscious sedation, would we prefer pre-op eval visit. Marykay LexHARDING,Ayron Fillinger W, MD

## 2014-02-20 NOTE — Telephone Encounter (Signed)
Letter composed and faxed to dental office at number provided below.

## 2014-02-20 NOTE — Telephone Encounter (Signed)
Notified Morrie Sheldonshley of information

## 2014-02-20 NOTE — Telephone Encounter (Signed)
Patient will be having local anesthesia for dental extractions Would like to know if they should avoid epinephrine? Does he need cardiac clearance? An pre-meds?  Fax: [phone]  Message forwarded to Dr. Donneta RombergHarding/Sharon, RN

## 2014-02-20 NOTE — Telephone Encounter (Signed)
Pt need 4 extractions, he needs clarence for this.Does he have any restrictions and does he need to be pre medicated? If the doctor feels he needs an oral surgeon,please let her know.

## 2014-02-23 ENCOUNTER — Ambulatory Visit: Payer: Medicare Other | Admitting: Cardiology

## 2014-02-24 ENCOUNTER — Ambulatory Visit: Payer: Medicare Other | Admitting: Cardiology

## 2014-02-28 ENCOUNTER — Encounter: Payer: Self-pay | Admitting: Cardiology

## 2014-02-28 ENCOUNTER — Ambulatory Visit (INDEPENDENT_AMBULATORY_CARE_PROVIDER_SITE_OTHER): Payer: Medicare Other | Admitting: Cardiology

## 2014-02-28 ENCOUNTER — Ambulatory Visit: Payer: Medicare Other | Admitting: Cardiology

## 2014-02-28 VITALS — BP 130/68 | HR 61 | Ht 69.0 in | Wt 293.0 lb

## 2014-02-28 DIAGNOSIS — I10 Essential (primary) hypertension: Secondary | ICD-10-CM

## 2014-02-28 DIAGNOSIS — I519 Heart disease, unspecified: Secondary | ICD-10-CM

## 2014-02-28 DIAGNOSIS — I209 Angina pectoris, unspecified: Secondary | ICD-10-CM

## 2014-02-28 DIAGNOSIS — I272 Pulmonary hypertension, unspecified: Secondary | ICD-10-CM

## 2014-02-28 DIAGNOSIS — I208 Other forms of angina pectoris: Secondary | ICD-10-CM

## 2014-02-28 DIAGNOSIS — Z955 Presence of coronary angioplasty implant and graft: Secondary | ICD-10-CM

## 2014-02-28 DIAGNOSIS — I5189 Other ill-defined heart diseases: Secondary | ICD-10-CM

## 2014-02-28 DIAGNOSIS — I25708 Atherosclerosis of coronary artery bypass graft(s), unspecified, with other forms of angina pectoris: Secondary | ICD-10-CM

## 2014-02-28 DIAGNOSIS — I83009 Varicose veins of unspecified lower extremity with ulcer of unspecified site: Secondary | ICD-10-CM

## 2014-02-28 DIAGNOSIS — I9589 Other hypotension: Secondary | ICD-10-CM

## 2014-02-28 DIAGNOSIS — E785 Hyperlipidemia, unspecified: Secondary | ICD-10-CM

## 2014-02-28 DIAGNOSIS — L97909 Non-pressure chronic ulcer of unspecified part of unspecified lower leg with unspecified severity: Secondary | ICD-10-CM

## 2014-02-28 DIAGNOSIS — I2089 Other forms of angina pectoris: Secondary | ICD-10-CM

## 2014-02-28 DIAGNOSIS — I270 Primary pulmonary hypertension: Secondary | ICD-10-CM

## 2014-02-28 NOTE — Patient Instructions (Signed)
Stop lisinopril  For 2 weeks then restart  Lisinopril 2.5 mg one tablet daily  Change metoprolol 50 mg twice a day  May use 25 -50 mg tablet as needed for angina   Your physician wants you to follow-up in 2 months Dr Herbie BaltimoreHarding  - 30 mins. You will receive a reminder letter in the mail two months in advance. If you don't receive a letter, please call our office to schedule the follow-up appointment.

## 2014-03-01 ENCOUNTER — Encounter: Payer: Self-pay | Admitting: Cardiology

## 2014-03-01 NOTE — Progress Notes (Signed)
PCP: No PCP Per Patient  Clinic Note: Chief Complaint  Patient presents with  . Follow-up    heartburn uses nitro; BP concerns    HPI: [name] is a 68 y.o. male with a PMH below who presents today for early evaluation for hypotension and dizziness.  During the last visit my attention was increased his lisinopril dose, but he did not.  Past Medical History  Diagnosis Date  . Myocardial infarction 1995    "total of ~ 15; 3 landed me in the hospital"; 1995 MI, 2008 NSTEMI, 08/2009 NSTEMI  . Chronic Stable Angina     Chronic -- Likely combination of small vessel disease as well as grade 3 diastolic dysfunction;; Not many PCI options left: Medical Management  . SOB (shortness of breath) on exertion     "Ball/angina, when lying down"  . CAD (coronary artery disease), native coronary artery 1995    Initial CABG 1985, redo CABG 2008; as of 2008 - Native RCA & LM 100%  . 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   . Diastolic dysfunction with chronic heart failure 2011    Grade 2-3 Diastolic dysfunction; Echo in 1610 - Grade 3 DD with Mod LVH; b. Echo 02/2012: EF 50-55%, Mild-Mod Conc LVH > Gr 2 DD (by images), Mild Ao Sclerosis.  . Pulmonary HTN, with nighttime oxygen 06/25/2011    "at night when I sleep & sometimes during the day; for my angina"  . S/P coronary artery stent placement 08/2009, 03/2010, 06/2011    a. 4/'11: UA - PCI to prox SVG-OM: Promus 3.0 mm x 28 mm;; b. 11/'11: NSTEMI: Guideliner assited 2 site PCI SVG-OM: distal - 2.75 mm x 12 mm (3.15mm), mid 3.0 mm x 28 mm (3.5 mm);; c. 06/25/11: Class IV Angina - dSVG-OM 90% ISR @ 1st anastomosis => PCI 90% to 0% with Promus DES 2.75 mm x 12 mm (3.0) [Z95.5]  . Contrast dye induced nephropathy, history of in setting of sepsis and dehydration 06/25/2011      History of  . CKD (chronic kidney disease) stage 2, GFR 60-89 ml/min 06/25/2011  . Essential hypertension 06/25/2011  . Dyslipidemia, with allery to statins 06/25/2011    Also intolerant of fenofibrate  . Chronic cutaneous venous stasis ulcer     Bilateral Chronic venous stasis ulcers making it difficult to wear compression stockings.  . Obesity hypoventilation syndrome   . Carotid artery plaque     CAROTID DOPPLER, 07/29/2012 - R ICA-0-49% diameter reduction, L Bulb/Proximal ICA-50-69% diameter reduction  . 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 Bilateral diabetic retinopathy   . History of acute renal failure 12/2010    S/P "cillin and/or dye from CT scan"  . Osteoarthritis of both knees     Osteoarthritis of both knees, at times he must use a wheelchair. [M17.0]  . GERD (gastroesophageal reflux disease)     Interval History: His main focus representing today he is concerned that his blood pressure. He brings in recordings where his blood pressures ranged from the 80s over 40s systolic into the low 100s. He is noted increased PVCs and palpitations. A couple occasions he has had near syncope. When this is noted a significant fatigue and dizziness, as far as I can tell he is not fully  lost consciousness. He has had some occasions when his glycemic control and then strange that as exacerbated the dizziness.. Examination remains much stable on oftentimes occurring as usual at night and white count. It is somewhat worse when his swelling is worse. He notes that his lower extremity swelling is notably improved to where he is only taking the Zaroxolyn every couple days, along with his Lasix. His angina it lasts a few minutes at a time but is not anymore less than usual. He still has PND with angina decubitus. He does get exertional angina he, but her steps were he to try to walk briskly. With routine activities relatively comfortable with the exception of his arthritis  pains.. Most of his angina is angina decubitus at night. He also has postprandial angina. He read about measuring his weights adjusting his Demadex dosing.  The venous stasis ulcers go to various stages of healing. He has a dressing on the left leg because the wound care team had been using Silvadene cream that led to skin erosion. He has been using lesions on the left upper gauzed and dressed. He doesn't have any active poorly healing ulcers,however.   He is now taking a total of 400 mg of CoQ10 and doing well with that. He is noted to sometimes his blood sugar control is fine without the use of insulin and sometimes even gets hypoglycemic insulin.  He denies any rapid or irregular heartbeat/arrhythmias. Despite a hypotensive episode of feeling near syncopal, he is not almost constant. No TIA or amaurosis fugax symptoms.  ROS: A comprehensive was performed. Review of Systems  Constitutional: Negative for weight loss and malaise/fatigue.  HENT: Negative for congestion and nosebleeds.   Respiratory: Negative for cough, shortness of breath and wheezing.   Cardiovascular: Positive for chest pain, orthopnea and leg swelling. Negative for palpitations, claudication and PND.  Gastrointestinal: Positive for heartburn. Negative for blood in stool and melena.  Genitourinary: Negative for hematuria.  Musculoskeletal: Positive for falls and joint pain.       Peripheral neuropathy and osteoarthritis pains;  he floor he lost his balance.  Neurological: Positive for dizziness. Negative for tremors, sensory change, speech change, focal weakness, seizures, loss of consciousness and headaches.       With hypotension and hypoglycemia  Endo/Heme/Allergies: Bruises/bleeds easily.  Psychiatric/Behavioral: Negative for depression and substance abuse. The patient is not nervous/anxious.     Current Outpatient Prescriptions on File Prior to Visit  Medication Sig Dispense Refill  . B Complex Vitamins (B COMPLEX  PO) Take 2 capsules by mouth daily.       . Cholecalciferol (VITAMIN D-3) 1000 UNITS CAPS Take 3,000 Units by mouth daily.       Marland Kitchen CINNAMON PO Take 2,000 mg by mouth 2 (two) times daily. With chrondtin      . clopidogrel (PLAVIX) 75 MG tablet Take 75 mg by mouth daily.      . Coenzyme Q10 (CO Q 10) 100 MG CAPS Take 100 mg by mouth 4 (four) times daily.       . diclofenac sodium (VOLTAREN) 1 % GEL Apply 2 g topically 4 (four) times daily as needed.  1 Tube  1  . Grape Seed 50 MG CAPS Take 50 mg by mouth daily.      Marland Kitchen Hyaluronic Acid-Vitamin C (HYALURONIC ACID PO) Take 100 mg by mouth. TAKE 1 CAPSULE 5 TIMES A WEEK      . ibuprofen (ADVIL,MOTRIN) 200 MG tablet Take 200 mg by mouth every  8 (eight) hours as needed. For inflamation      . insulin aspart (NOVOLOG) 100 UNIT/ML injection Inject into the skin See admin instructions. 5 grams carbs = 1 unit,  11 gram protein = 1 unit,    10 points over 100 cbg = 1 unit      . insulin NPH (HUMULIN N,NOVOLIN N) 100 UNIT/ML injection Inject 35-40 Units into the skin 3 (three) times daily before meals.       . isosorbide mononitrate (IMDUR) 30 MG 24 hr tablet Take 15 mg by mouth 3 (three) times daily. Mostly 15mg  TID, occasionally takes 30mg       . Magnesium Oxide 250 MG TABS Take 1 tablet by mouth daily.      . metolazone (ZAROXOLYN) 5 MG tablet Take 5 mg by mouth daily. For fluid      . metoprolol (LOPRESSOR) 50 MG tablet Take 1 tablet (50 mg total) by mouth 3 (three) times daily.  90 tablet  5  . nitroGLYCERIN (NITROSTAT) 0.4 MG SL tablet Place 0.4 mg under the tongue every 5 (five) minutes as needed. For chest pain      . potassium chloride SA (K-DUR,KLOR-CON) 20 MEQ tablet Take 40 meq 3 times a week,and 30 meq 4 times aweek      . Saw Palmetto, Serenoa repens, 450 MG CAPS Take 450 mg by mouth daily.      Marland Kitchen. torsemide (DEMADEX) 100 MG tablet Take 50-100 mg by mouth daily as needed. For fluid retension       No current facility-administered medications on  file prior to visit.   ALLERGIES REVIEWED IN EPIC -- no  change SOCIAL AND FAMILY HISTORY REVIEWED IN EPIC -- no  change  Wt Readings from Last 3 Encounters:  02/28/14 293 lb (132.904 kg)  08/26/13 293 lb 8 oz (133.131 kg)  04/21/13 286 lb 14.4 oz (130.137 kg)   PHYSICAL EXAM BP 130/68  Pulse 61  Ht 5\' 9"  (1.753 m)  Wt 293 lb (132.904 kg)  BMI 43.25 kg/m2 General appearance: alert, cooperative, appears stated age, morbidly obese and Normal mood and affect.  Neck: JVD - 1 cm above sternal notch, no adenopathy, Left greater than right carotid bruit  HEENT: Wears glasses, [place]/AT, EOMI, MMM, anicteric sclera. Mild right eyelid cholesteatoma  Lungs: Mostly CTAB with mild diminished sounds; no Ball./R./R. Normal percussion bilaterally and Nonlabored, just diminished throughout due to body habitus.  Heart: RRR, S1, S2 normal but distant, S4 present; Unable to palpate apical impulse . No other murmurs or rubs and noted to be significant.  Abdomen: Grossly obese, unable to palpate any HSM. Unable to determine edema or ascites.  Extremities: edema 1+ bilaterally, venous stasis dermatitis noted -- diffuse worsening stasis ulcers in various stages of healing; several lesions on both lower extremities; left leg has dressings in place with an Ace wrap holding them in place. Pulses: Diminished low short pulses of the 2/2 edema.  Skin: Brawny/purple hemosiderin staining, venous stasis changes. See extremities above  Neurologic: Mental status: Alert, oriented, thought content appropriate, affect: mood-congruent    Adult ECG Report  Rate: 61 ;  Rhythm: normal sinus rhythm; incomplete right bundle branch block  Narrative Interpretation: Stable EKG  Recent Labs:  August 2015   TC 243, TG 102, HDL 36, LDL 173 LFTs normal.  ASSESSMENT / PLAN: Hypotension, iatrogenic Somewhat concerning for his low pressures on. This is not really been an issue from the past. His last visit blood pressures were in  the  130s. The plan to be to stop lisinopril and then restart in 2 weeks at 2.5 mg daily. I will also changes metoprolol 50 mg twice a day and the ability to take additional dose when necessary angina. Ensure adequate hydration.  Followup in 2 months.  Stable angina, chronic - it is usually occurring at night but also with moderate exertion Still has chronic stable angina and he really is only able to do low dose Imdur. We're asked off the metoprolol to twice a day. Past he did not do well with that. We'll see how he does. He is on Plavix with no bleeding episodes. Most of his angina his angina decubitus. Hopefully as his volumes as normalizes, his angina improves.  At this point were solicited on medical therapy. If things were to change or get worse I think were potentially have to reconsider him at the For him he really only had a LIMA and vein graft to the OM left. The distal branch perfused by the sequential graft did not seem to be very brisk during last catheterization. He is not a re-redo CABG candidate. Center for his symptoms continue to progress he will need chronic pain medication management.  CAD: CABG '95 with redo '08- s/p multiple PCIs since Is done poorly with stress test in the past. He still has chronic stable angina. From a coronary standpoint he is on a beta blocker, ACE inhibitor, Plavix and long-acting nitrate. Unfortunately his glycemic control and lipid control is less than ideal.    Pulmonary HTN, with nighttime oxygen Again a combination of obesity and ventilation and diastolic failure. He sleeps fine with oxygen in place.  Essential hypertension As the first vastus and hypotensive. I think that an ACE inhibitor as mentioned above. Also backing down the beta blocker dose.  Diastolic dysfunction, grade 3 on Echo- March 2011 Volume control is very important. He uses torsemide and when necessary Zaroxolyn.  Status post coronary artery stent placement, 06/25/11 in distal  SVG to OM for instent restenosis 90% to 0% with Promus DES Pretty much his entire vein graft was stented, which is a set up for in stent restenosis. He is on Plavix, and beta blocker.  Dyslipidemia, with intol to statins His lipid panel is a little bit better than and was in the past on the coenzyme Q 10. He is intolerant of statins, 5 rates and Zetia. My only hope is that we may be able to get him on a PCSK 9 inhibitor. We did discuss this when I see him back to recheck his blood pressures.  He doesn't really only one meal a day and doesn't really a lot of walking. So hard to see how his changes this.  Venous stasis ulcers Thankfully he is being managed by the wound care clinic through the [place]. The swelling is still relatively stable.    Orders Placed This Encounter  Procedures  . EKG 12-Lead   Meds ordered this encounter  Medications  . lisinopril (PRINIVIL,ZESTRIL) 2.5 MG tablet    Sig: Take 1 tablet (2.5 mg total) by mouth daily.    Dispense:  90 tablet    Refill:  3   stopped lisinopril 5 mg daily.  [name] is a very complex difficult patient with an extensive medical history who presented with multiple issues today he required an extensive medical process seen. There is a soft lasted close to 45 minutes with an additional 20+ minutes of charting.   Followup: 2  months   [name],[name], M.D., M.S. Interventional Cardiologist   Pager # [phone]

## 2014-03-02 DIAGNOSIS — I9589 Other hypotension: Secondary | ICD-10-CM | POA: Insufficient documentation

## 2014-03-02 MED ORDER — LISINOPRIL 2.5 MG PO TABS: 2.5000 mg | ORAL_TABLET | Freq: Every day | ORAL | Status: DC

## 2014-03-02 NOTE — Assessment & Plan Note (Signed)
Somewhat concerning for his low pressures on. This is not really been an issue from the past. His last visit blood pressures were in the 130s. The plan to be to stop lisinopril and then restart in 2 weeks at 2.5 mg daily. I will also changes metoprolol 50 mg twice a day and the ability to take additional dose when necessary angina. Ensure adequate hydration.  Followup in 2 months.

## 2014-03-02 NOTE — Assessment & Plan Note (Signed)
As the first vastus and hypotensive. I think that an ACE inhibitor as mentioned above. Also backing down the beta blocker dose.

## 2014-03-02 NOTE — Assessment & Plan Note (Signed)
Again a combination of obesity and ventilation and diastolic failure. He sleeps fine with oxygen in place.

## 2014-03-02 NOTE — Assessment & Plan Note (Signed)
His lipid panel is a little bit better than and was in the past on the coenzyme Q 10. He is intolerant of statins, 5 rates and Zetia. My only hope is that we may be able to get him on a PCSK 9 inhibitor. We did discuss this when I see him back to recheck his blood pressures.  He doesn't really only one meal a day and doesn't really a lot of walking. So hard to see how his changes this.

## 2014-03-02 NOTE — Assessment & Plan Note (Signed)
Still has chronic stable angina and he really is only able to do low dose Imdur. We're asked off the metoprolol to twice a day. Past he did not do well with that. We'll see how he does. He is on Plavix with no bleeding episodes. Most of his angina his angina decubitus. Hopefully as his volumes as normalizes, his angina improves.  At this point were solicited on medical therapy. If things were to change or get worse I think were potentially have to reconsider him at the For him he really only had a LIMA and vein graft to the OM left. The distal branch perfused by the sequential graft did not seem to be very brisk during last catheterization. He is not a re-redo CABG candidate. Center for his symptoms continue to progress he will need chronic pain medication management.

## 2014-03-02 NOTE — Assessment & Plan Note (Signed)
Volume control is very important. He uses torsemide and when necessary Zaroxolyn.

## 2014-03-02 NOTE — Assessment & Plan Note (Signed)
Pretty much his entire vein graft was stented, which is a set up for in stent restenosis. He is on Plavix, and beta blocker.

## 2014-03-02 NOTE — Assessment & Plan Note (Signed)
Is done poorly with stress test in the past. He still has chronic stable angina. From a coronary standpoint he is on a beta blocker, ACE inhibitor, Plavix and long-acting nitrate. Unfortunately his glycemic control and lipid control is less than ideal.

## 2014-03-02 NOTE — Assessment & Plan Note (Signed)
Thankfully he is being managed by [facility]. The swelling is still relatively stable.

## 2014-04-06 ENCOUNTER — Telehealth: Payer: Self-pay | Admitting: Cardiology

## 2014-04-06 NOTE — Telephone Encounter (Signed)
I can't think of any contraindication.  DH

## 2014-04-06 NOTE — Telephone Encounter (Signed)
SPOKE TO PATIENT Patient states the [facility] (Dr Andrey CampanileWilson) Wants him to use gradient pump for 60 to 20 ("justfit circ-aid" knee to ankle pump) wound area-- for both legs an hour twice a day. The other time wear compressions wraps. Patient states he has new wound on right leg. Patient wanted Dr Herbie BaltimoreHarding to know - to see if any contraindication Per patient , no heart failure at this time.

## 2014-04-06 NOTE — Telephone Encounter (Signed)
[facility] pump for [facility] legs.

## 2014-04-07 NOTE — Telephone Encounter (Signed)
Spoke to patient  Notified patient of Dr Herbie BaltimoreHarding answer. Verbalized understanding.

## 2014-04-13 ENCOUNTER — Encounter (HOSPITAL_COMMUNITY): Payer: Self-pay | Admitting: Cardiology

## 2014-04-27 ENCOUNTER — Telehealth: Payer: Self-pay | Admitting: Cardiology

## 2014-04-27 NOTE — Telephone Encounter (Signed)
Pt calling regarding his deteriorating venous stasis ulcers. Based upon the very long telephone conversation and repetitive descriptions, his legs are in bad shape. [facility] but apparently [facility] after minimal visits. [facility] but only one visit at this point. Provided compression pump but now his skin sloughing off, painful to use. He has been self-treating including use of hydrogen peroxide (recommended against by me!) and battle dressings.   Denies fevers, purulent drainage. Recommended he [facility] to the [facility] or [facility] for further evaluation. He prefers to wait.  Does not necessarily sound infected but sounds severe. Needs wound care evaluation and education.  FYI to Dr. Herbie BaltimoreHarding.

## 2014-06-09 ENCOUNTER — Ambulatory Visit (INDEPENDENT_AMBULATORY_CARE_PROVIDER_SITE_OTHER): Payer: Medicare Other | Admitting: Cardiology

## 2014-06-09 VITALS — BP 136/68 | HR 82 | Ht 69.0 in | Wt 274.3 lb

## 2014-06-09 DIAGNOSIS — I519 Heart disease, unspecified: Secondary | ICD-10-CM

## 2014-06-09 DIAGNOSIS — I208 Other forms of angina pectoris: Secondary | ICD-10-CM

## 2014-06-09 DIAGNOSIS — I272 Pulmonary hypertension, unspecified: Secondary | ICD-10-CM

## 2014-06-09 DIAGNOSIS — L97909 Non-pressure chronic ulcer of unspecified part of unspecified lower leg with unspecified severity: Secondary | ICD-10-CM

## 2014-06-09 DIAGNOSIS — I5189 Other ill-defined heart diseases: Secondary | ICD-10-CM

## 2014-06-09 DIAGNOSIS — E785 Hyperlipidemia, unspecified: Secondary | ICD-10-CM

## 2014-06-09 DIAGNOSIS — I10 Essential (primary) hypertension: Secondary | ICD-10-CM

## 2014-06-09 DIAGNOSIS — I25708 Atherosclerosis of coronary artery bypass graft(s), unspecified, with other forms of angina pectoris: Secondary | ICD-10-CM

## 2014-06-09 DIAGNOSIS — I270 Primary pulmonary hypertension: Secondary | ICD-10-CM

## 2014-06-09 DIAGNOSIS — I83009 Varicose veins of unspecified lower extremity with ulcer of unspecified site: Secondary | ICD-10-CM

## 2014-06-09 NOTE — Patient Instructions (Signed)
Your physician wants you to follow-up in: 6 months or sooner if needed.You will receive a reminder letter in the mail two months in advance. If you don't receive a letter, please call our office to schedule the follow-up appointment. No changes were made today in your therapy. 

## 2014-06-11 ENCOUNTER — Encounter: Payer: Self-pay | Admitting: Cardiology

## 2014-06-11 NOTE — Assessment & Plan Note (Signed)
20 pound weight loss. He feels better. More active. Hopefully with diet adjustment and continued exercise he'll continue to lose.

## 2014-06-11 NOTE — Assessment & Plan Note (Signed)
Relatively stable now. His age it is much control. He remains on stable dose of beta blocker and low-dose ACE inhibitor with nitrate. He is on Plavix but no aspirin. Unless he has worsening symptoms, I would not proceed with screening stress test.

## 2014-06-11 NOTE — Assessment & Plan Note (Signed)
Doing well with stable symptoms if not improved. Continue current dose of Imdur and beta blocker. Not having used nearly as much PRN Nitroglycerin.

## 2014-06-11 NOTE — Assessment & Plan Note (Signed)
Finally the [facility]. Swelling is much improved.

## 2014-06-11 NOTE — Assessment & Plan Note (Signed)
Maintaining adequate volume levels and afterload reduction with ACE inhibitors vital. Currently stable

## 2014-06-11 NOTE — Assessment & Plan Note (Signed)
Not seen lipids evaluated in some time. We'll probably need to check next visit [facility] May need to consider referral for PCSK-9 Inhibitor Rx.

## 2014-06-11 NOTE — Assessment & Plan Note (Signed)
Continue with oxygen each bedtime

## 2014-06-11 NOTE — Progress Notes (Signed)
PCP: No PCP Per Patient  Clinic Note: Chief Complaint  Patient presents with  . 3 mo rov    patient reports having no problems since last visit.   HPI: [name] is a 69 y.o. male with a PMH below who presents today for routine 3 monthe follow-up.  We had adjusted his BB & ACE-I dose during his last visit.  Past Medical History  Diagnosis Date  . Myocardial infarction 1995    "total of ~ 15; 3 landed me in the hospital"; 1995 MI, 2008 NSTEMI, 08/2009 NSTEMI  . Chronic Stable Angina     Chronic -- Likely combination of small vessel disease as well as grade 3 diastolic dysfunction;; Not many PCI options left: Medical Management  . SOB (shortness of breath) on exertion     "w/angina, when lying down"  . CAD (coronary artery disease), native coronary artery 1995    Initial CABG 1985, redo CABG 2008; as of 2008 - Native RCA & LM 100%  . 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   . Diastolic dysfunction with chronic heart failure 2011    Grade 2-3 Diastolic dysfunction; Echo in 1610 - Grade 3 DD with Mod LVH; b. Echo 02/2012: EF 50-55%, Mild-Mod Conc LVH > Gr 2 DD (by images), Mild Ao Sclerosis.  . Pulmonary HTN, with nighttime oxygen 06/25/2011    "at night when I sleep & sometimes during the day; for my angina"  . S/P coronary artery stent placement 08/2009, 03/2010, 06/2011    a. 4/'11: UA - PCI to prox SVG-OM: Promus 3.0 mm x 28 mm;; b. 11/'11: NSTEMI: Guideliner assited 2 site PCI SVG-OM: distal - 2.75 mm x 12 mm (3.72mm), mid 3.0 mm x 28 mm (3.5 mm);; c. 06/25/11: Class IV Angina - dSVG-OM 90% ISR @ 1st anastomosis => PCI 90% to 0% with Promus DES 2.75 mm x 12 mm (3.0) [Z95.5]  . Contrast dye induced nephropathy, history of in setting of sepsis and dehydration 06/25/2011    History of  . CKD  (chronic kidney disease) stage 2, GFR 60-89 ml/min 06/25/2011  . Essential hypertension 06/25/2011  . Dyslipidemia, with allery to statins 06/25/2011    Also intolerant of fenofibrate  . Chronic cutaneous venous stasis ulcer     Bilateral Chronic venous stasis ulcers making it difficult to wear compression stockings.  . Obesity hypoventilation syndrome   . Carotid artery plaque     CAROTID DOPPLER, 07/29/2012 - R ICA-0-49% diameter reduction, L Bulb/Proximal ICA-50-69% diameter reduction  . 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 Bilateral diabetic retinopathy   . History of acute renal failure 12/2010    S/P "cillin and/or dye from CT scan"  . Osteoarthritis of both knees     Osteoarthritis of both knees, at times he must use a wheelchair. [M17.0]  . GERD (gastroesophageal reflux disease)     Interval History:  He presents today, he was feeling the best he has in several years. He has lost close to 20 pounds since his last visit. Mostly related to diuresis with significant improvement of his edema. He still has various stages of healing ulcers in his life furthermore we improved. Still has a dry stasis changes but notably improved. He does not have any more of his dizziness  or lightheadedness. His blood pressure much more stable. In fact with the recent adjustment to his medications and him having started coenzyme Q10 along with magnesium, he is not really having that much the way of any significant angina. Still has intermittent angina decubitus but is not having take additional much closer and beyond his baseline doses of isosorbide mononitrate. He started the Toprol back to 50 mg in the morning afternoon with 0.25 evening. He is on stable dose of Demadex taking 100mg  a day.  He denies any rapid or irregular heartbeat/arrhythmias.no further episodes of near syncope or dizziness.  He still has exertional angina to some extent but no resting angina. He is much more  mobile and able to do things without significant dyspnea or angina.  No PND but still stable orthopnea. No syncopeyncope or TIA/amaurosis fugax.  ROS: A comprehensive was performed. Review of Systems  HENT: Negative for nosebleeds.   Gastrointestinal: Negative for blood in stool and melena.  Genitourinary: Negative for hematuria.  Musculoskeletal: Positive for joint pain (Arthritis in knees and hips).  Skin:       Significant stasis ulcers.  Neurological: Negative for dizziness.  Endo/Heme/Allergies: Does not bruise/bleed easily.  Psychiatric/Behavioral: Negative for depression.    Current Outpatient Prescriptions on File Prior to Visit  Medication Sig Dispense Refill  . B Complex Vitamins (B COMPLEX PO) Take 2 capsules by mouth daily.     . Cholecalciferol (VITAMIN D-3) 1000 UNITS CAPS Take 6,000 Units by mouth daily.     Marland Kitchen. CINNAMON PO Take 2,000 mg by mouth 2 (two) times daily. With chrondtin    . clopidogrel (PLAVIX) 75 MG tablet Take 75 mg by mouth daily.    . Coenzyme Q10 (CO Q 10) 100 MG CAPS Take 100 mg by mouth 4 (four) times daily.     . diclofenac sodium (VOLTAREN) 1 % GEL Apply 2 g topically 4 (four) times daily as needed. 1 Tube 1  . Grape Seed 50 MG CAPS Take 50 mg by mouth daily.    Marland Kitchen. Hyaluronic Acid-Vitamin C (HYALURONIC ACID PO) Take 100 mg by mouth. TAKE 1 CAPSULE 5 TIMES A WEEK    . ibuprofen (ADVIL,MOTRIN) 200 MG tablet Take 200 mg by mouth every 8 (eight) hours as needed. For inflamation    . insulin aspart (NOVOLOG) 100 UNIT/ML injection Inject into the skin See admin instructions. 5 grams carbs = 1 unit,  11 gram protein = 1 unit,    10 points over 100 cbg = 1 unit    . insulin NPH (HUMULIN N,NOVOLIN N) 100 UNIT/ML injection Inject 35-40 Units into the skin 3 (three) times daily before meals.     . isosorbide mononitrate (IMDUR) 30 MG 24 hr tablet Take 15 mg by mouth 3 (three) times daily. Mostly 15mg  TID, occasionally takes 30mg     . lisinopril (PRINIVIL,ZESTRIL)  2.5 MG tablet Take 1 tablet (2.5 mg total) by mouth daily. 90 tablet 3  . Magnesium Oxide 250 MG TABS Take 2 tablets by mouth daily.     . metolazone (ZAROXOLYN) 5 MG tablet Take 5 mg by mouth daily. For fluid    . metoprolol (LOPRESSOR) 50 MG tablet Take 1 tablet (50 mg total) by mouth 3 (three) times daily. 90 tablet 5  . nitroGLYCERIN (NITROSTAT) 0.4 MG SL tablet Place 0.4 mg under the tongue every 5 (five) minutes as needed. For chest pain    . potassium chloride SA (K-DUR,KLOR-CON) 20 MEQ tablet Take 40 meq 3 times  a week,and 30 meq 4 times aweek    . Saw Palmetto, Serenoa repens, 450 MG CAPS Take 450 mg by mouth daily.    Marland Kitchen torsemide (DEMADEX) 100 MG tablet Take 50-100 mg by mouth daily as needed. For fluid retension     No current facility-administered medications on file prior to visit.   ALLERGIES REVIEWED IN EPIC -- no  change SOCIAL AND FAMILY HISTORY REVIEWED IN EPIC -- no  change  Wt Readings from Last 3 Encounters:  06/09/14 274 lb 4.8 oz (124.422 kg)  02/28/14 293 lb (132.904 kg)  08/26/13 293 lb 8 oz (133.131 kg)   PHYSICAL EXAM BP 136/68 mmHg  Pulse 82  Ht 5\' 9"  (1.753 m)  Wt 274 lb 4.8 oz (124.422 kg)  BMI 40.49 kg/m2 General appearance: alert, cooperative, appears stated age, morbidly obese and Normal mood and affect.  Neck: JVD - 1 cm above sternal notch, no adenopathy, Left greater than right carotid bruit  HEENT: Wears glasses, [place]/AT, EOMI, MMM, anicteric sclera. Mild right eyelid cholesteatoma  Lungs: Mostly CTAB with mild diminished sounds; no W./R./R. Normal percussion bilaterally and Nonlabored, just diminished throughout due to body habitus.  Heart: RRR, S1, S2 normal but distant, S4 present; Unable to palpate apical impulse . No other murmurs or rubs and noted to be significant.  Abdomen: Grossly obese, unable to palpate any HSM. Unable to determine edema or ascites.  Extremities: trace bilaterally, venous stasis dermatitis noted -- diffuse worsening stasis  ulcers in various stages of healing; several lesions on both lower extremities; both ankles have several different gauze bandages for poorly healing ulcers. Pulses: Diminished low short pulses of the 2/2 edema.  Skin: Brawny/purple hemosiderin staining, venous stasis changes. See extremities above  Neurologic: Mental status: Alert, oriented, thought content appropriate, affect: mood-congruent    Adult ECG Report - not performed  Recent Labs:  Not available  ASSESSMENT / PLAN: Probably about as healthy as I have seen him in years.  CAD: CABG '95 with redo '08- s/p multiple PCIs since Relatively stable now. His age it is much control. He remains on stable dose of beta blocker and low-dose ACE inhibitor with nitrate. He is on Plavix but no aspirin. Unless he has worsening symptoms, I would not proceed with screening stress test.   Stable angina, chronic - it is usually occurring at night but also with moderate exertion Doing well with stable symptoms if not improved. Continue current dose of Imdur and beta blocker. Not having used nearly as much PRN Nitroglycerin.   Pulmonary HTN, with nighttime oxygen Continue with oxygen each bedtime   Venous stasis ulcers Finally the treated by podiatrist and wound care of the [place]. Swelling is much improved.   Morbid obesity - with likely obesity hypoventilation syndrome 20 pound weight loss. He feels better. More active. Hopefully with diet adjustment and continued exercise he'll continue to lose.   Diastolic dysfunction, grade 3 on Echo- March 2011 Maintaining adequate volume levels and afterload reduction with ACE inhibitors vital. Currently stable   Dyslipidemia, with intol to statins Not seen lipids evaluated in some time. We'll probably need to check next visit if not check by [facility].. May need to consider referral for [facility] Inhibitor Rx.     No orders of the defined types were placed in this encounter.   No orders of the defined types  were placed in this encounter.       Followup: 4months   Yaslyn Cumby, Piedad Climes, M.D., M.S. Interventional Cardiologist  Pager # [phone]

## 2014-11-14 ENCOUNTER — Telehealth: Payer: Self-pay | Admitting: Cardiology

## 2014-11-14 NOTE — Telephone Encounter (Signed)
Pt called in wanting to speak with Jasmine DecemberSharon about a doppler that was done on his legs back in 2014. [facility] Dr[facility] Christell ConstantMoore is needing the results from that. Please call  Thanks

## 2014-11-14 NOTE — Telephone Encounter (Signed)
Spoke to [name] [name] states he wanted  DR harding to be aware he is taking metoprolol 50 mg bi.id and 1/2 tablet at supper time.  at times having angina with small  Amount exercise. [name] states he is adjusting medication. [name] states he still not taking LISINOPRIL. It makes his stomach upset. [name] is not in any distress. [name] has an appointment on 12/01/14 with Dr Herbie BaltimoreHARDING.  RN spoke with [name] for 30 min.

## 2014-11-14 NOTE — Telephone Encounter (Signed)
Patient states needed information sent to Dr [name](Valerie ) [name] [place] Fax [phone] Dr [name] is a new doctor - need more information about patient's wound ulcer care.  [facility] ,no known information about lower extremity dopplers. Notified patient.

## 2014-11-14 NOTE — Telephone Encounter (Signed)
Left message to call back  

## 2014-11-25 NOTE — Telephone Encounter (Signed)
He likes to adjust his medicine. I can't do much to change it. That is the way he is.

## 2014-12-01 ENCOUNTER — Encounter: Payer: Self-pay | Admitting: Cardiology

## 2014-12-01 ENCOUNTER — Ambulatory Visit (INDEPENDENT_AMBULATORY_CARE_PROVIDER_SITE_OTHER): Payer: Medicare Other | Admitting: Cardiology

## 2014-12-01 VITALS — BP 130/64 | HR 68 | Ht 68.5 in | Wt 287.6 lb

## 2014-12-01 DIAGNOSIS — I5189 Other ill-defined heart diseases: Secondary | ICD-10-CM

## 2014-12-01 DIAGNOSIS — I208 Other forms of angina pectoris: Secondary | ICD-10-CM

## 2014-12-01 DIAGNOSIS — E119 Type 2 diabetes mellitus without complications: Secondary | ICD-10-CM

## 2014-12-01 DIAGNOSIS — R600 Localized edema: Secondary | ICD-10-CM

## 2014-12-01 DIAGNOSIS — I25708 Atherosclerosis of coronary artery bypass graft(s), unspecified, with other forms of angina pectoris: Secondary | ICD-10-CM

## 2014-12-01 DIAGNOSIS — I270 Primary pulmonary hypertension: Secondary | ICD-10-CM

## 2014-12-01 DIAGNOSIS — I519 Heart disease, unspecified: Secondary | ICD-10-CM

## 2014-12-01 DIAGNOSIS — R0609 Other forms of dyspnea: Secondary | ICD-10-CM | POA: Diagnosis not present

## 2014-12-01 DIAGNOSIS — I83009 Varicose veins of unspecified lower extremity with ulcer of unspecified site: Secondary | ICD-10-CM

## 2014-12-01 DIAGNOSIS — I10 Essential (primary) hypertension: Secondary | ICD-10-CM

## 2014-12-01 DIAGNOSIS — Z794 Long term (current) use of insulin: Secondary | ICD-10-CM

## 2014-12-01 DIAGNOSIS — Z951 Presence of aortocoronary bypass graft: Secondary | ICD-10-CM

## 2014-12-01 DIAGNOSIS — L97909 Non-pressure chronic ulcer of unspecified part of unspecified lower leg with unspecified severity: Secondary | ICD-10-CM

## 2014-12-01 DIAGNOSIS — Z955 Presence of coronary angioplasty implant and graft: Secondary | ICD-10-CM

## 2014-12-01 DIAGNOSIS — I272 Pulmonary hypertension, unspecified: Secondary | ICD-10-CM

## 2014-12-01 DIAGNOSIS — E785 Hyperlipidemia, unspecified: Secondary | ICD-10-CM

## 2014-12-01 MED ORDER — LISINOPRIL 5 MG PO TABS: 5.0000 mg | ORAL_TABLET | Freq: Every day | ORAL | Status: DC

## 2014-12-01 MED ORDER — FUROSEMIDE 40 MG PO TABS: ORAL_TABLET | ORAL | Status: DC

## 2014-12-01 NOTE — Patient Instructions (Signed)
Increase Lisinopril to 5 mg  One tablet daily  Stop Demadex ( torsemide)  Start lasix 40 to 80 mg  (1 to 2 ) Tablets   Daily for leg swelling.  Rule out venous insuff.---Your physician has requested that you have a lower extremity venous duplex. This test is an ultrasound of the veins in the legs. It looks at venous blood flow that carries blood from the heart to the legs. Allow one hour for a Lower Venous exam. There are no restrictions or special instructions.  Use pulsating compression stocking  Your physician wants you to follow-up in 6 months Dr Herbie Baltimore    30 min appointment. You will receive a reminder letter in the mail two months in advance. If you don't receive a letter, please call our office to schedule the follow-up appointment.

## 2014-12-01 NOTE — Progress Notes (Signed)
PCP: No PCP Per Patient  Clinic Note: Chief Complaint  Patient presents with  . Follow-up      91month; ocassional chest pain, shortness of breath-frequently, no edema, has pain in legs, has cramping in legs, no lightheadedness, no dizziness  . Coronary Artery Disease    chronic stable angina  . Leg Swelling   HPI: [name] is a 69 y.o. male with a PMH below who presents today for routine 3 month follow-up.  As is usually case, he adjusts his own medications according to his symptoms. At his last visit, he was about as happy and healthy as I had seen him in years. He lost close to 20 pounds - most of which is regained  Past Medical History  Diagnosis Date  . Myocardial infarction 1995    "total of ~ 15; 3 landed me in the hospital"; 1995 MI, 2008 NSTEMI, 08/2009 NSTEMI  . Chronic Stable Angina     Chronic -- Likely combination of small vessel disease as well as grade 3 diastolic dysfunction;; Not many PCI options left: Medical Management  . SOB (shortness of breath) on exertion     "w/angina, when lying down"  . CAD (coronary artery disease), native coronary artery 1995    Initial CABG 1985, redo CABG 2008; as of 2008 - Native RCA & LM 100%  . 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   . Diastolic dysfunction with chronic heart failure 2011    Grade 2-3 Diastolic dysfunction; Echo in 9604 - Grade 3 DD with Mod LVH; b. Echo 02/2012: EF 50-55%, Mild-Mod Conc LVH > Gr 2 DD (by images), Mild Ao Sclerosis.  . Pulmonary HTN, with nighttime oxygen 06/25/2011    "at night when I sleep & sometimes during the day; for my angina"  . S/P coronary artery stent placement 08/2009, 03/2010, 06/2011    a. 4/'11: UA - PCI to prox SVG-OM: Promus 3.0 mm x 28 mm;; b. 11/'11: NSTEMI: Guideliner assited 2 site PCI  SVG-OM: distal - 2.75 mm x 12 mm (3.57mm), mid 3.0 mm x 28 mm (3.5 mm);; c. 06/25/11: Class IV Angina - dSVG-OM 90% ISR @ 1st anastomosis => PCI 90% to 0% with Promus DES 2.75 mm x 12 mm (3.0) [Z95.5]  . Contrast dye induced nephropathy, history of in setting of sepsis and dehydration 06/25/2011    History of  . CKD (chronic kidney disease) stage 2, GFR 60-89 ml/min 06/25/2011  . Essential hypertension 06/25/2011  . Dyslipidemia, with allery to statins 06/25/2011    Also intolerant of fenofibrate  . Chronic cutaneous venous stasis ulcer     Bilateral Chronic venous stasis ulcers making it difficult to wear compression stockings.  . Obesity hypoventilation syndrome   . Carotid artery plaque     CAROTID DOPPLER, 07/29/2012 - R ICA-0-49% diameter reduction, L Bulb/Proximal ICA-50-69% diameter reduction  . 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 Bilateral diabetic retinopathy   . History of acute renal failure 12/2010    S/P "cillin and/or dye from CT scan"  . Osteoarthritis of both knees     Osteoarthritis of both knees, at times he must use a wheelchair. [M17.0]  . GERD (gastroesophageal reflux disease)     Interval History:  He is not nearly as in good  spirits this time around as he was last visit. His wife is with him today, and continuously interrupting him to remind him of symptoms. Despite the fact that he says his angina symptoms have improved significantly since starting CoQ10 at 400 mg a day, his wife to remind him that he is still having to take nitroglycerin tablets. He is probably used 100 in the last 3 months. Still notes having angina decubitus as well as with moderate exertion. Walking maybe 40 feet in addition to causing extreme fatigue and leg pains we'll also make him short of breath. If he keeps going he will then also have angina. This is actually occurring more than his angina decubitus and he also notes having a sensation of having more gas and light  bubbles in his abdomen. He also thinks he may have had some type of reaction to [facility] which he been on for a while. He says that of late, after taking his Demadex tablets he notes having blisters in his lower extremity skin. He states, that from a cardiac standpoint he feels relatively stable, but his main issues are "minor problems "including his worsening lower extremity edema and venous ulcers. He is concerned because he feels like he just keeps becoming full circle with this condition. He has now been evaluated by a new doctor at the [place], who was recommended venous Dopplers - which he would prefer to have done here.  He has increased his lisinopril to 5 mg, and is now taking metoprolol 50 mg 3 times a day. Still taking Imdur 15 3 times a day. Only occasionally takes 30 mg, because this makes him feel that.  He denies any rapid or irregular heartbeat/arrhythmias.no further episodes of near syncope or dizziness.  He still has exertional angina to some extent but no resting angina. He is much more mobile and able to do things without significant dyspnea or angina.  No PND but still stable orthopnea. No syncopeyncope or TIA/amaurosis fugax.  ROS: A comprehensive was performed. Review of Systems  HENT: Negative for nosebleeds.   Respiratory: Positive for shortness of breath.   Cardiovascular: Positive for leg swelling.  Gastrointestinal: Negative for blood in stool and melena.       Significant dysphagia to the point where he has to "act like a snake to move food down his esophagus.  Genitourinary: Negative for hematuria.  Musculoskeletal: Positive for joint pain (Arthritis in knees and hips).  Skin:       Significant stasis ulcers.  Neurological: Negative for dizziness.  Endo/Heme/Allergies: Does not bruise/bleed easily.  Psychiatric/Behavioral: Negative for depression.  All other systems reviewed and are negative.   Current Outpatient Prescriptions on File Prior to Visit  Medication Sig  Dispense Refill  . B Complex Vitamins (B COMPLEX PO) Take 2 capsules by mouth daily.     . Cholecalciferol (VITAMIN D-3) 1000 UNITS CAPS Take 6,000 Units by mouth daily.     Marland Kitchen CINNAMON PO Take 2,000 mg by mouth 2 (two) times daily. With chrondtin    . clopidogrel (PLAVIX) 75 MG tablet Take 75 mg by mouth daily.    . diclofenac sodium (VOLTAREN) 1 % GEL Apply 2 g topically 4 (four) times daily as needed. 1 Tube 1  . Grape Seed 50 MG CAPS Take 50 mg by mouth daily.    Marland Kitchen Hyaluronic Acid-Vitamin C (HYALURONIC ACID PO) Take 100 mg by mouth. TAKE 1 CAPSULE 5 TIMES A WEEK    . ibuprofen (ADVIL,MOTRIN) 200 MG tablet Take  200 mg by mouth every 8 (eight) hours as needed. For inflamation    . insulin aspart (NOVOLOG) 100 UNIT/ML injection Inject into the skin See admin instructions. 5 grams carbs = 1 unit,  11 gram protein = 1 unit,    10 points over 100 cbg = 1 unit    . insulin NPH (HUMULIN N,NOVOLIN N) 100 UNIT/ML injection Inject 35-40 Units into the skin 3 (three) times daily before meals.     . isosorbide mononitrate (IMDUR) 30 MG 24 hr tablet Take 15 mg by mouth 3 (three) times daily. Mostly 15mg  TID, occasionally takes 30mg     . Magnesium Oxide 250 MG TABS Take 2 tablets by mouth daily.     . metolazone (ZAROXOLYN) 5 MG tablet Take 5 mg by mouth daily. For fluid    . metoprolol (LOPRESSOR) 50 MG tablet Take 1 tablet (50 mg total) by mouth 3 (three) times daily. 90 tablet 5  . nitroGLYCERIN (NITROSTAT) 0.4 MG SL tablet Place 0.4 mg under the tongue every 5 (five) minutes as needed. For chest pain    . potassium chloride SA (K-DUR,KLOR-CON) 20 MEQ tablet Take 40 meq 3 times a week,and 30 meq 4 times aweek    . Saw Palmetto, Serenoa repens, 450 MG CAPS Take 450 mg by mouth daily.    Marland Kitchen torsemide (DEMADEX) 100 MG tablet Take 50-100 mg by mouth daily as needed. For fluid retension     No current facility-administered medications on file prior to visit.   Allergies  Allergen Reactions  . Ivp Dye  [Iodinated Diagnostic Agents] Other (See Comments)    "found out in August 2012; I'm allergic to dye used in CT scans; causes lots of edema"  . Statins Other (See Comments)    Muscle ache; "rhabdomyolysis"  . Other Other (See Comments)    Uncoded Allergy. Allergen: Statins  . Ranexa [Ranolazine]     Jittery,as well as constipation and bleeding hemorrhoids.  . Adhesive [Tape] Itching and Rash  . Penicillins Swelling, Rash and Other (See Comments)    Other Reaction: Other reaction     Wt Readings from Last 3 Encounters:  12/01/14 287 lb 9 oz (130.437 kg)  06/09/14 274 lb 4.8 oz (124.422 kg)  02/28/14 293 lb (132.904 kg)    PHYSICAL EXAM BP 130/64 mmHg  Pulse 68  Ht 5' 8.5" (1.74 m)  Wt 287 lb 9 oz (130.437 kg)  BMI 43.08 kg/m2 General appearance: alert, cooperative, appears stated age, morbidly obese and Normal mood and affect.  Neck: JVD - 1 cm above sternal notch, no adenopathy, Left greater than right carotid bruit  HEENT: Wears glasses, [place]/AT, EOMI, MMM, anicteric sclera. Mild right eyelid cholesteatoma  Lungs: Mostly CTAB with mild diminished sounds; no W./R./R. Normal percussion bilaterally and Nonlabored, just diminished throughout due to body habitus.  Heart: RRR, S1, S2 normal but distant, S4 present; Unable to palpate apical impulse . No other murmurs or rubs and noted to be significant.  Abdomen: Grossly obese, unable to palpate any HSM. Unable to determine edema or ascites.  Extremities: both legs are now wrapped with Ace wraps/[place] style. He does have significantly worsened edema with several stasis ulcers of various levels of healing.  Pulses: Diminished low short pulses of the 2/2 edema.  Skin: Brawny/purple hemosiderin staining, venous stasis changes. See extremities above  Neurologic: Mental status: Alert, oriented, thought content appropriate, affect: mood-congruent    Adult ECG Report - performed NSR with sinus arrhythmia and 1 AV B.  Nonspecific ST and T  wave abnormality. Incomplete RBBB. Otherwise normal axis, durations and intervals. Normal voltage. -- Stable EKG  Recent Labs:  Not available  ASSESSMENT / PLAN: His major concerns and questions are referenced his lower extremity issues. The [place] surgeon is recommended having lower extremity venous Dopplers to evaluate for venous insufficiency.  As I cannot find where these studies were her previously performed (although I'm sure they were), we will then repeat venous Dopplers. I'm sure that what will probably be found is deep venous insufficiency.  Problem List Items Addressed This Visit    Bilateral leg edema    See Venous stasis ulcers      Relevant Orders   VAS [place] LOWER EXTREMITY VENOUS REFLUX   CAD: CABG '95 with redo '08- s/p multiple PCIs since (Chronic)    He is taking more nitroglycerin than he had been. Partly because he has been off of his Demadex. Not a lot of options for what to do well for her. I don't think a stress test will be helpful. He doesn't seem to be too inclined to go back to the Cath Lab for an invasive evaluation. For now all simply increase afterload reduction and try to get him back on a diuretic. On Plavix without aspirin.      Relevant Medications   furosemide (LASIX) 40 MG tablet   lisinopril (PRINIVIL,ZESTRIL) 5 MG tablet   Other Relevant Orders   EKG 12-Lead (Completed)   Diabetes mellitus, type Ball, insulin dependent (Chronic)    Labile sugars. He is excited that his former endocrinologists from the [place] has now set up practice in [facility]. He is hoping to get back in to see her.      Relevant Medications   lisinopril (PRINIVIL,ZESTRIL) 5 MG tablet   Diastolic dysfunction, grade 3 on Echo- March 2011 (Chronic)    Switching diuretic to Lasix. Increase ACE inhibitor 5 mg for better Afterload reduction      DOE (dyspnea on exertion) (Chronic)   Dyslipidemia, with intol to statins (Chronic)    I not sure if his endocrinologist is helping to manage this.  If not out referred him to our pharmacy team for management.      Essential hypertension - Primary (Chronic)    Stable. Beta blocker dose was reduced. Without more blood pressure, I can increase his ACE inhibitor to 5 mg.      Relevant Medications   furosemide (LASIX) 40 MG tablet   lisinopril (PRINIVIL,ZESTRIL) 5 MG tablet   Other Relevant Orders   EKG 12-Lead (Completed)   Morbid obesity - with likely obesity hypoventilation syndrome (Chronic)    He has gained back 13 pounds from February., Disappointed. Some of it may be volume related. Hopefully with the increased diuretic dosing this will come down some. I think partly he is less active now for several reasons. I reiterated the importance of continuing to try to get some exercise and cutting back on his dietary intake. He says he doesn't eat much which I simply a hard time believing      Relevant Orders   EKG 12-Lead (Completed)   Pulmonary HTN, with nighttime oxygen (Chronic)    Uses nighttime oxygen. He may be beneficial for him to use oxygen on exertion as well. He clearly has some secondary pulmonary attention issues but also has high likelihood of having some obesity hypoventilation component as well.      Relevant Medications   furosemide (LASIX) 40 MG tablet  lisinopril (PRINIVIL,ZESTRIL) 5 MG tablet   S/P CABG x 4 (Chronic)   Stable angina, class 2-3 chronic - it is usually occurring at night but also with moderate exertion (Chronic)    Heart intelligence symptoms. It sounds like he is very limited from many standpoints. Unfortunately he is intolerant of her neck so which would be very helpful. He adjusts his own medications which makes it very difficult. He is taking Imdur but 3 times a day because did not tolerate Isordil 3 times a day. He is taking metoprolol 3 times a day. I think some of his angina is related to diastolic dysfunction as well as severe microvascular disease and lack of overall conduits. Keeping his  Prelone level down is important. Will switch back to Lasix, since he says he is having problems with Demadex. Next option will be Bumex.      Relevant Medications   furosemide (LASIX) 40 MG tablet   lisinopril (PRINIVIL,ZESTRIL) 5 MG tablet   Status post coronary artery stent placement, 06/25/11 in distal SVG to OM for instent restenosis 90% to 0% with Promus DES (Chronic)    He is much is wall to wall stents in this vein graft. Unfortunately it was a sequential graft and at least one of the target vessels has essentially occluded. If his symptoms and exertional angina to be worse, we may be looking at having to go back in to take a look. At this point he is not interested in this option. On Plavix, beta blocker and ACE inhibitor.      Venous stasis ulcers (Chronic)    These are starting to act up lately. We will go ahead and order the venous Dopplers, since we have not done this evaluation for sometime. Continue using - ACE wraps for compression. Continue using sequential to depression devices 2 hours daily Switching from Demadex to Lasix - with when necessary Zaroxolyn      Relevant Orders   EKG 12-Lead (Completed)   VAS [place] LOWER EXTREMITY VENOUS REFLUX    Other Visit Diagnoses    Coronary artery disease involving coronary bypass graft of native heart with other forms of angina pectoris  (Chronic)       Relevant Medications    furosemide (LASIX) 40 MG tablet    lisinopril (PRINIVIL,ZESTRIL) 5 MG tablet        Orders Placed This Encounter  Procedures  . EKG 12-Lead   Meds ordered this encounter  Medications  . Coenzyme Q10-Vitamin E 100-100 MG-UNIT CAPS    Sig: Take 1 capsule by mouth 2 (two) times daily. Take 1 caps twice a day  . Multiple Vitamins-Minerals (MULTIVITAMIN & MINERAL PO)    Sig: Take 1 tablet by mouth daily. Take 1 tab daily  . furosemide (LASIX) 40 MG tablet    Sig: Take 1 to 2 tablet  a day for swelling.    Dispense:  60 tablet    Refill:  6  .  lisinopril (PRINIVIL,ZESTRIL) 5 MG tablet    Sig: Take 1 tablet (5 mg total) by mouth daily.    Dispense:  90 tablet    Refill:  3     Patient Instructions: Increase Lisinopril to 5 mg  One tablet daily  Stop Demadex ( torsemide)  Start lasix 40 to 80 mg  (1 to 2 ) Tablets   Daily for leg swelling.  Rule out venous insuff.---Your physician has requested that you have a lower extremity venous duplex. This test is an ultrasound of  the veins in the legs. It looks at venous blood flow that carries blood from the heart to the legs. Allow one hour for a Lower Venous exam. There are no restrictions or special instructions.  Use pulsating compression stocking  Your physician wants you to follow-up in 6 months Dr Herbie Baltimore    30 min appointment.  Routine followup visits with Tyran are always very complex and difficult. I spent close to 45 minutes each visit and his multiple complaints. The majority of the session (greater than 20%) is related to counseling and discussion.   Marykay Lex, M.D., M.S. Interventional Cardiologist   Pager # [phone]

## 2014-12-05 ENCOUNTER — Telehealth: Payer: Self-pay | Admitting: Cardiology

## 2014-12-05 NOTE — Telephone Encounter (Signed)
[name] states she called Dr. Kavin Leech to schedule his venous reflux study and he told her he would have to call back when he checked with his wife.  [name] states she will let me know once this study is scheduled.

## 2014-12-07 DIAGNOSIS — R600 Localized edema: Secondary | ICD-10-CM | POA: Insufficient documentation

## 2014-12-07 NOTE — Assessment & Plan Note (Signed)
Heart intelligence symptoms. It sounds like he is very limited from many standpoints. Unfortunately he is intolerant of her neck so which would be very helpful. He adjusts his own medications which makes it very difficult. He is taking Imdur but 3 times a day because did not tolerate Isordil 3 times a day. He is taking metoprolol 3 times a day. I think some of his angina is related to diastolic dysfunction as well as severe microvascular disease and lack of overall conduits. Keeping his Prelone level down is important. Will switch back to Lasix, since he says he is having problems with Demadex. Next option will be Bumex.

## 2014-12-07 NOTE — Assessment & Plan Note (Signed)
He is taking more nitroglycerin than he had been. Partly because he has been off of his Demadex. Not a lot of options for what to do well for her. I don't think a stress test will be helpful. He doesn't seem to be too inclined to go back to the Cath Lab for an invasive evaluation. For now all simply increase afterload reduction and try to get him back on a diuretic. On Plavix without aspirin.

## 2014-12-07 NOTE — Assessment & Plan Note (Signed)
See Venous stasis ulcers

## 2014-12-07 NOTE — Assessment & Plan Note (Signed)
Labile sugars. He is excited that his former endocrinologists from the [place] has now set up practice in [facility]. He is hoping to get back in to see her.

## 2014-12-07 NOTE — Assessment & Plan Note (Signed)
I not sure if his endocrinologist is helping to manage this. If not out referred him to our pharmacy team for management.

## 2014-12-07 NOTE — Assessment & Plan Note (Signed)
Stable. Beta blocker dose was reduced. Without more blood pressure, I can increase his ACE inhibitor to 5 mg.

## 2014-12-07 NOTE — Assessment & Plan Note (Signed)
He is much is wall to wall stents in this vein graft. Unfortunately it was a sequential graft and at least one of the target vessels has essentially occluded. If his symptoms and exertional angina to be worse, we may be looking at having to go back in to take a look. At this point he is not interested in this option. On Plavix, beta blocker and ACE inhibitor.

## 2014-12-07 NOTE — Assessment & Plan Note (Signed)
He has gained back 13 pounds from February., Disappointed. Some of it may be volume related. Hopefully with the increased diuretic dosing this will come down some. I think partly he is less active now for several reasons. I reiterated the importance of continuing to try to get some exercise and cutting back on his dietary intake. He says he doesn't eat much which I simply a hard time believing

## 2014-12-07 NOTE — Assessment & Plan Note (Signed)
Uses nighttime oxygen. He may be beneficial for him to use oxygen on exertion as well. He clearly has some secondary pulmonary attention issues but also has high likelihood of having some obesity hypoventilation component as well.

## 2014-12-07 NOTE — Assessment & Plan Note (Signed)
Switching diuretic to Lasix. Increase ACE inhibitor 5 mg for better Afterload reduction

## 2014-12-07 NOTE — Assessment & Plan Note (Addendum)
These are starting to act up lately. We will go ahead and order the venous Dopplers, since we have not done this evaluation for sometime. Continue using - ACE wraps for compression. Continue using sequential to depression devices 2 hours daily Switching from Demadex to Lasix - with when necessary Zaroxolyn

## 2014-12-28 ENCOUNTER — Encounter (HOSPITAL_COMMUNITY): Payer: No Typology Code available for payment source

## 2014-12-29 ENCOUNTER — Ambulatory Visit (HOSPITAL_COMMUNITY)
Admission: RE | Admit: 2014-12-29 | Discharge: 2014-12-29 | Disposition: A | Discharge status: Home / Self Care | Payer: Medicare Other | Source: Ambulatory Visit | Attending: Vascular Surgery | Admitting: Vascular Surgery

## 2014-12-29 DIAGNOSIS — R600 Localized edema: Secondary | ICD-10-CM

## 2014-12-29 DIAGNOSIS — I83009 Varicose veins of unspecified lower extremity with ulcer of unspecified site: Secondary | ICD-10-CM | POA: Insufficient documentation

## 2014-12-29 DIAGNOSIS — L97909 Non-pressure chronic ulcer of unspecified part of unspecified lower leg with unspecified severity: Secondary | ICD-10-CM

## 2015-02-03 HISTORY — PX: TRANSTHORACIC ECHOCARDIOGRAM: SHX275

## 2015-02-23 ENCOUNTER — Observation Stay (HOSPITAL_COMMUNITY): Payer: Medicare Other

## 2015-02-23 ENCOUNTER — Emergency Department (HOSPITAL_COMMUNITY): Payer: Medicare Other

## 2015-02-23 ENCOUNTER — Inpatient Hospital Stay (HOSPITAL_COMMUNITY)
Admission: EM | Admit: 2015-02-23 | Discharge: 2015-02-28 | DRG: 280 | Disposition: A | Discharge status: Home Health | Payer: Medicare Other | Attending: Family Medicine | Admitting: Family Medicine

## 2015-02-23 ENCOUNTER — Encounter (HOSPITAL_COMMUNITY): Payer: Self-pay

## 2015-02-23 ENCOUNTER — Telehealth: Payer: Self-pay | Admitting: Cardiology

## 2015-02-23 DIAGNOSIS — I83009 Varicose veins of unspecified lower extremity with ulcer of unspecified site: Secondary | ICD-10-CM

## 2015-02-23 DIAGNOSIS — I209 Angina pectoris, unspecified: Secondary | ICD-10-CM | POA: Diagnosis not present

## 2015-02-23 DIAGNOSIS — M79609 Pain in unspecified limb: Secondary | ICD-10-CM

## 2015-02-23 DIAGNOSIS — N181 Chronic kidney disease, stage 1: Secondary | ICD-10-CM | POA: Diagnosis present

## 2015-02-23 DIAGNOSIS — L97229 Non-pressure chronic ulcer of left calf with unspecified severity: Secondary | ICD-10-CM | POA: Diagnosis present

## 2015-02-23 DIAGNOSIS — L97519 Non-pressure chronic ulcer of other part of right foot with unspecified severity: Secondary | ICD-10-CM | POA: Diagnosis present

## 2015-02-23 DIAGNOSIS — E785 Hyperlipidemia, unspecified: Secondary | ICD-10-CM | POA: Diagnosis present

## 2015-02-23 DIAGNOSIS — Z794 Long term (current) use of insulin: Secondary | ICD-10-CM

## 2015-02-23 DIAGNOSIS — Z9981 Dependence on supplemental oxygen: Secondary | ICD-10-CM

## 2015-02-23 DIAGNOSIS — R262 Difficulty in walking, not elsewhere classified: Secondary | ICD-10-CM | POA: Insufficient documentation

## 2015-02-23 DIAGNOSIS — I10 Essential (primary) hypertension: Secondary | ICD-10-CM | POA: Diagnosis present

## 2015-02-23 DIAGNOSIS — E1122 Type 2 diabetes mellitus with diabetic chronic kidney disease: Secondary | ICD-10-CM | POA: Diagnosis present

## 2015-02-23 DIAGNOSIS — M549 Dorsalgia, unspecified: Secondary | ICD-10-CM | POA: Diagnosis present

## 2015-02-23 DIAGNOSIS — L97529 Non-pressure chronic ulcer of other part of left foot with unspecified severity: Secondary | ICD-10-CM | POA: Diagnosis present

## 2015-02-23 DIAGNOSIS — I214 Non-ST elevation (NSTEMI) myocardial infarction: Secondary | ICD-10-CM | POA: Diagnosis not present

## 2015-02-23 DIAGNOSIS — I878 Other specified disorders of veins: Secondary | ICD-10-CM | POA: Diagnosis present

## 2015-02-23 DIAGNOSIS — E119 Type 2 diabetes mellitus without complications: Secondary | ICD-10-CM

## 2015-02-23 DIAGNOSIS — I25110 Atherosclerotic heart disease of native coronary artery with unstable angina pectoris: Secondary | ICD-10-CM | POA: Diagnosis present

## 2015-02-23 DIAGNOSIS — M109 Gout, unspecified: Secondary | ICD-10-CM | POA: Insufficient documentation

## 2015-02-23 DIAGNOSIS — I5032 Chronic diastolic (congestive) heart failure: Secondary | ICD-10-CM | POA: Diagnosis not present

## 2015-02-23 DIAGNOSIS — M170 Bilateral primary osteoarthritis of knee: Secondary | ICD-10-CM | POA: Diagnosis present

## 2015-02-23 DIAGNOSIS — L97329 Non-pressure chronic ulcer of left ankle with unspecified severity: Secondary | ICD-10-CM | POA: Diagnosis present

## 2015-02-23 DIAGNOSIS — E1165 Type 2 diabetes mellitus with hyperglycemia: Secondary | ICD-10-CM | POA: Diagnosis present

## 2015-02-23 DIAGNOSIS — L97319 Non-pressure chronic ulcer of right ankle with unspecified severity: Secondary | ICD-10-CM | POA: Diagnosis present

## 2015-02-23 DIAGNOSIS — I130 Hypertensive heart and chronic kidney disease with heart failure and stage 1 through stage 4 chronic kidney disease, or unspecified chronic kidney disease: Secondary | ICD-10-CM | POA: Diagnosis present

## 2015-02-23 DIAGNOSIS — E11319 Type 2 diabetes mellitus with unspecified diabetic retinopathy without macular edema: Secondary | ICD-10-CM | POA: Diagnosis present

## 2015-02-23 DIAGNOSIS — E11622 Type 2 diabetes mellitus with other skin ulcer: Secondary | ICD-10-CM | POA: Diagnosis present

## 2015-02-23 DIAGNOSIS — G8929 Other chronic pain: Secondary | ICD-10-CM | POA: Diagnosis present

## 2015-02-23 DIAGNOSIS — L97909 Non-pressure chronic ulcer of unspecified part of unspecified lower leg with unspecified severity: Secondary | ICD-10-CM

## 2015-02-23 DIAGNOSIS — K219 Gastro-esophageal reflux disease without esophagitis: Secondary | ICD-10-CM | POA: Diagnosis present

## 2015-02-23 DIAGNOSIS — I272 Pulmonary hypertension, unspecified: Secondary | ICD-10-CM | POA: Diagnosis present

## 2015-02-23 DIAGNOSIS — R079 Chest pain, unspecified: Secondary | ICD-10-CM | POA: Insufficient documentation

## 2015-02-23 DIAGNOSIS — N182 Chronic kidney disease, stage 2 (mild): Secondary | ICD-10-CM | POA: Diagnosis present

## 2015-02-23 DIAGNOSIS — I252 Old myocardial infarction: Secondary | ICD-10-CM

## 2015-02-23 DIAGNOSIS — L97219 Non-pressure chronic ulcer of right calf with unspecified severity: Secondary | ICD-10-CM | POA: Diagnosis present

## 2015-02-23 DIAGNOSIS — I25810 Atherosclerosis of coronary artery bypass graft(s) without angina pectoris: Secondary | ICD-10-CM | POA: Diagnosis present

## 2015-02-23 DIAGNOSIS — L8990 Pressure ulcer of unspecified site, unspecified stage: Secondary | ICD-10-CM | POA: Insufficient documentation

## 2015-02-23 DIAGNOSIS — I5033 Acute on chronic diastolic (congestive) heart failure: Secondary | ICD-10-CM

## 2015-02-23 DIAGNOSIS — E662 Morbid (severe) obesity with alveolar hypoventilation: Secondary | ICD-10-CM | POA: Diagnosis present

## 2015-02-23 DIAGNOSIS — M25461 Effusion, right knee: Secondary | ICD-10-CM

## 2015-02-23 DIAGNOSIS — I509 Heart failure, unspecified: Secondary | ICD-10-CM

## 2015-02-23 DIAGNOSIS — Z951 Presence of aortocoronary bypass graft: Secondary | ICD-10-CM

## 2015-02-23 DIAGNOSIS — Z6841 Body Mass Index (BMI) 40.0 and over, adult: Secondary | ICD-10-CM

## 2015-02-23 DIAGNOSIS — Z87891 Personal history of nicotine dependence: Secondary | ICD-10-CM

## 2015-02-23 DIAGNOSIS — Z91041 Radiographic dye allergy status: Secondary | ICD-10-CM

## 2015-02-23 DIAGNOSIS — Z7902 Long term (current) use of antithrombotics/antiplatelets: Secondary | ICD-10-CM

## 2015-02-23 DIAGNOSIS — Z955 Presence of coronary angioplasty implant and graft: Secondary | ICD-10-CM

## 2015-02-23 DIAGNOSIS — I872 Venous insufficiency (chronic) (peripheral): Secondary | ICD-10-CM | POA: Diagnosis present

## 2015-02-23 LAB — CBC WITH DIFFERENTIAL/PLATELET
BASOS ABS: 0 10*3/uL (ref 0.0–0.1)
BASOS PCT: 0 %
EOS PCT: 0 %
Eosinophils Absolute: 0.1 10*3/uL (ref 0.0–0.7)
HCT: 35.2 % — ABNORMAL LOW (ref 39.0–52.0)
Hemoglobin: 11.5 g/dL — ABNORMAL LOW (ref 13.0–17.0)
LYMPHS PCT: 12 %
Lymphs Abs: 1.6 10*3/uL (ref 0.7–4.0)
MCH: 27.4 pg (ref 26.0–34.0)
MCHC: 32.7 g/dL (ref 30.0–36.0)
MCV: 83.8 fL (ref 78.0–100.0)
Monocytes Absolute: 0.9 10*3/uL (ref 0.1–1.0)
Monocytes Relative: 7 %
NEUTROS ABS: 10.9 10*3/uL — AB (ref 1.7–7.7)
Neutrophils Relative %: 81 %
PLATELETS: 277 10*3/uL (ref 150–400)
RBC: 4.2 MIL/uL — AB (ref 4.22–5.81)
RDW: 14.4 % (ref 11.5–15.5)
WBC: 13.5 10*3/uL — AB (ref 4.0–10.5)

## 2015-02-23 LAB — BRAIN NATRIURETIC PEPTIDE: B NATRIURETIC PEPTIDE 5: 236.8 pg/mL — AB (ref 0.0–100.0)

## 2015-02-23 LAB — TROPONIN I
TROPONIN I: 0.1 ng/mL — AB (ref <0.031)
Troponin I: 0.05 ng/mL — ABNORMAL HIGH (ref <0.031)
Troponin I: 0.17 ng/mL — ABNORMAL HIGH (ref <0.031)

## 2015-02-23 LAB — I-STAT CG4 LACTIC ACID, ED: LACTIC ACID, VENOUS: 1.5 mmol/L (ref 0.5–2.0)

## 2015-02-23 LAB — SYNOVIAL CELL COUNT + DIFF, W/ CRYSTALS
Lymphocytes-Synovial Fld: 2 % (ref 0–20)
Monocyte-Macrophage-Synovial Fluid: 1 % — ABNORMAL LOW (ref 50–90)
Neutrophil, Synovial: 97 % — ABNORMAL HIGH (ref 0–25)
WBC, Synovial: 16720 /mm3 — ABNORMAL HIGH (ref 0–200)

## 2015-02-23 LAB — COMPREHENSIVE METABOLIC PANEL
ALBUMIN: 2.5 g/dL — AB (ref 3.5–5.0)
ALT: 19 U/L (ref 17–63)
AST: 21 U/L (ref 15–41)
Alkaline Phosphatase: 99 U/L (ref 38–126)
Anion gap: 9 (ref 5–15)
BUN: 19 mg/dL (ref 6–20)
CHLORIDE: 97 mmol/L — AB (ref 101–111)
CO2: 28 mmol/L (ref 22–32)
CREATININE: 1.01 mg/dL (ref 0.61–1.24)
Calcium: 8.7 mg/dL — ABNORMAL LOW (ref 8.9–10.3)
GFR calc Af Amer: >60 mL/min (ref >60)
GLUCOSE: 164 mg/dL — AB (ref 65–99)
POTASSIUM: 3.6 mmol/L (ref 3.5–5.1)
Sodium: 134 mmol/L — ABNORMAL LOW (ref 135–145)
Total Bilirubin: 1.2 mg/dL (ref 0.3–1.2)
Total Protein: 6.2 g/dL — ABNORMAL LOW (ref 6.5–8.1)

## 2015-02-23 LAB — CK: Total CK: 181 U/L (ref 49–397)

## 2015-02-23 LAB — URIC ACID: URIC ACID, SERUM: 8.4 mg/dL — AB (ref 4.4–7.6)

## 2015-02-23 LAB — GLUCOSE, CAPILLARY: GLUCOSE-CAPILLARY: 166 mg/dL — AB (ref 65–99)

## 2015-02-23 LAB — C-REACTIVE PROTEIN: CRP: 28.9 mg/dL — AB (ref <1.0)

## 2015-02-23 LAB — GRAM STAIN

## 2015-02-23 LAB — SEDIMENTATION RATE: SED RATE: 102 mm/h — AB (ref 0–16)

## 2015-02-23 MED ORDER — HEPARIN BOLUS VIA INFUSION
4000.0000 [IU] | Freq: Once | INTRAVENOUS | Status: AC
  Administered 2015-02-23: 4000 [IU] via INTRAVENOUS
  Filled 2015-02-23: qty 4000

## 2015-02-23 MED ORDER — NITROGLYCERIN IN D5W 200-5 MCG/ML-% IV SOLN
0.0000 ug/min | INTRAVENOUS | Status: DC
  Administered 2015-02-23: 5 ug/min via INTRAVENOUS
  Filled 2015-02-23: qty 250

## 2015-02-23 MED ORDER — CHLORHEXIDINE GLUCONATE 0.12 % MT SOLN
15.0000 mL | Freq: Two times a day (BID) | OROMUCOSAL | Status: DC
  Administered 2015-02-23 – 2015-02-26 (×5): 15 mL via OROMUCOSAL
  Filled 2015-02-23 (×9): qty 15

## 2015-02-23 MED ORDER — CETYLPYRIDINIUM CHLORIDE 0.05 % MT LIQD
7.0000 mL | Freq: Two times a day (BID) | OROMUCOSAL | Status: DC
  Administered 2015-02-24 – 2015-02-25 (×3): 7 mL via OROMUCOSAL

## 2015-02-23 MED ORDER — HYDROMORPHONE HCL 2 MG PO TABS: 1.0000 mg | ORAL_TABLET | ORAL | Status: DC | PRN

## 2015-02-23 MED ORDER — CLOPIDOGREL BISULFATE 75 MG PO TABS
75.0000 mg | ORAL_TABLET | Freq: Every day | ORAL | Status: DC
  Administered 2015-02-23 – 2015-02-27 (×5): 75 mg via ORAL
  Filled 2015-02-23 (×5): qty 1

## 2015-02-23 MED ORDER — LISINOPRIL 5 MG PO TABS
5.0000 mg | ORAL_TABLET | Freq: Every day | ORAL | Status: DC
  Administered 2015-02-23: 5 mg via ORAL
  Filled 2015-02-23 (×3): qty 1

## 2015-02-23 MED ORDER — ENSURE ENLIVE PO LIQD: 237.0000 mL | Freq: Two times a day (BID) | ORAL | Status: DC

## 2015-02-23 MED ORDER — ASPIRIN 81 MG PO CHEW
81.0000 mg | CHEWABLE_TABLET | Freq: Every day | ORAL | Status: DC
  Administered 2015-02-23 – 2015-02-28 (×7): 81 mg via ORAL
  Filled 2015-02-23 (×7): qty 1

## 2015-02-23 MED ORDER — MORPHINE SULFATE (PF) 2 MG/ML IV SOLN
1.0000 mg | INTRAVENOUS | Status: DC | PRN
  Administered 2015-02-23 – 2015-02-25 (×2): 1 mg via INTRAVENOUS
  Filled 2015-02-23 (×3): qty 1

## 2015-02-23 MED ORDER — METOPROLOL TARTRATE 50 MG PO TABS
50.0000 mg | ORAL_TABLET | Freq: Three times a day (TID) | ORAL | Status: DC
  Administered 2015-02-23: 50 mg via ORAL
  Filled 2015-02-23: qty 1

## 2015-02-23 MED ORDER — ACETAMINOPHEN 325 MG PO TABS: 650.0000 mg | ORAL_TABLET | ORAL | Status: DC | PRN

## 2015-02-23 MED ORDER — COLCHICINE 0.6 MG PO TABS
1.2000 mg | ORAL_TABLET | Freq: Once | ORAL | Status: AC
  Administered 2015-02-23: 1.2 mg via ORAL
  Filled 2015-02-23: qty 2

## 2015-02-23 MED ORDER — ENOXAPARIN SODIUM 40 MG/0.4ML ~~LOC~~ SOLN
40.0000 mg | SUBCUTANEOUS | Status: DC
  Administered 2015-02-23: 40 mg via SUBCUTANEOUS
  Filled 2015-02-23: qty 0.4

## 2015-02-23 MED ORDER — ONDANSETRON HCL 4 MG/2ML IJ SOLN: 4.0000 mg | Freq: Four times a day (QID) | INTRAMUSCULAR | Status: DC | PRN

## 2015-02-23 MED ORDER — INSULIN ASPART 100 UNIT/ML ~~LOC~~ SOLN: 0.0000 [IU] | Freq: Three times a day (TID) | SUBCUTANEOUS | Status: DC

## 2015-02-23 MED ORDER — METOLAZONE 5 MG PO TABS: 5.0000 mg | ORAL_TABLET | Freq: Every day | ORAL | Status: DC

## 2015-02-23 MED ORDER — FUROSEMIDE 80 MG PO TABS: 80.0000 mg | ORAL_TABLET | Freq: Every day | ORAL | Status: DC | PRN

## 2015-02-23 MED ORDER — METOPROLOL TARTRATE 50 MG PO TABS
75.0000 mg | ORAL_TABLET | Freq: Three times a day (TID) | ORAL | Status: DC
  Administered 2015-02-24 – 2015-02-25 (×4): 75 mg via ORAL
  Filled 2015-02-23 (×8): qty 1

## 2015-02-23 MED ORDER — COLCHICINE 0.6 MG PO TABS
0.6000 mg | ORAL_TABLET | Freq: Once | ORAL | Status: AC
  Administered 2015-02-23: 0.6 mg via ORAL
  Filled 2015-02-23: qty 1

## 2015-02-23 MED ORDER — INSULIN NPH (HUMAN) (ISOPHANE) 100 UNIT/ML ~~LOC~~ SUSP
35.0000 [IU] | Freq: Every day | SUBCUTANEOUS | Status: DC
  Administered 2015-02-23: 35 [IU] via SUBCUTANEOUS
  Filled 2015-02-23: qty 10

## 2015-02-23 MED ORDER — LIDOCAINE HCL 2 % IJ SOLN
5.0000 mL | Freq: Once | INTRAMUSCULAR | Status: AC
  Administered 2015-02-23: 100 mg
  Filled 2015-02-23: qty 20

## 2015-02-23 MED ORDER — INSULIN NPH (HUMAN) (ISOPHANE) 100 UNIT/ML ~~LOC~~ SUSP: 35.0000 [IU] | Freq: Every day | SUBCUTANEOUS | Status: DC

## 2015-02-23 MED ORDER — INSULIN ASPART 100 UNIT/ML ~~LOC~~ SOLN: 0.0000 [IU] | Freq: Every day | SUBCUTANEOUS | Status: DC

## 2015-02-23 MED ORDER — INSULIN NPH (HUMAN) (ISOPHANE) 100 UNIT/ML ~~LOC~~ SUSP
35.0000 [IU] | Freq: Every day | SUBCUTANEOUS | Status: DC
  Filled 2015-02-23: qty 10

## 2015-02-23 MED ORDER — COLCHICINE 0.6 MG PO TABS
0.6000 mg | ORAL_TABLET | Freq: Every day | ORAL | Status: DC
  Administered 2015-02-24 – 2015-02-28 (×5): 0.6 mg via ORAL
  Filled 2015-02-23 (×5): qty 1

## 2015-02-23 MED ORDER — FUROSEMIDE 40 MG PO TABS: 40.0000 mg | ORAL_TABLET | Freq: Every day | ORAL | Status: DC | PRN

## 2015-02-23 MED ORDER — ISOSORBIDE MONONITRATE ER 30 MG PO TB24
15.0000 mg | ORAL_TABLET | Freq: Three times a day (TID) | ORAL | Status: DC
  Administered 2015-02-23 – 2015-02-28 (×13): 15 mg via ORAL
  Filled 2015-02-23 (×13): qty 1

## 2015-02-23 MED ORDER — FUROSEMIDE 10 MG/ML IJ SOLN
40.0000 mg | Freq: Two times a day (BID) | INTRAMUSCULAR | Status: DC
  Administered 2015-02-23: 40 mg via INTRAVENOUS
  Filled 2015-02-23: qty 4

## 2015-02-23 MED ORDER — NITROGLYCERIN 0.4 MG SL SUBL
0.4000 mg | SUBLINGUAL_TABLET | SUBLINGUAL | Status: DC | PRN
  Administered 2015-02-23 (×2): 0.4 mg via SUBLINGUAL
  Filled 2015-02-23 (×2): qty 1

## 2015-02-23 MED ORDER — HEPARIN (PORCINE) IN NACL 100-0.45 UNIT/ML-% IJ SOLN
1400.0000 [IU]/h | INTRAMUSCULAR | Status: DC
  Administered 2015-02-23: 1400 [IU]/h via INTRAVENOUS
  Filled 2015-02-23: qty 250

## 2015-02-23 MED ORDER — GI COCKTAIL ~~LOC~~: 30.0000 mL | Freq: Three times a day (TID) | ORAL | Status: DC | PRN

## 2015-02-23 NOTE — Consult Note (Signed)
CARDIOLOGY INPATIENT CONSULTATION NOTE  Patient ID: [name] MRN: [id number], DOB/AGE: February 23, 1946   Admit date: 02/23/2015   Primary Physician: [facility] Primary Cardiologist: Bryan Lemma MD Ordering Physician: Velora Heckler MD  Reason for consultation: Chest pain  HPI: This is a 69 y.o. male with known history of CAD s/p CABG x 2 (redo in 2008) w/multiple PCIs last in 2011 (LIMA-LAD patent, SVG-OM1-2 patent (with PCI on 3 separate occasions), SVG-RCA & SVG-D1 occluded; from 2008: fRad-OM 100%, SVG-RCA 100%), diastolic heart failure Grade III, pulmonary hypertension (on oxygen at night), last NSTEMI in 08/2009, DM2, HTN, HDL, chronic venous ulcers and chronic stasis dermatitis.  Patient initially presented with chief complaint of worsening leg swelling and ulcers. He was aggravated by the symptoms but then also complained about his daily chest pains. Patient gets chest pain on walking just 20-30 feet. He cannot walk to his mailbox which is 40 feet away. He usually takes NTG and it relieves the chest pain. He was admitted on 02/23/2015 by family medicine. Around 10 pm patient started to have irregular rhythm with chest pain. He took 2 x NTG which relieved the pain to 2/10 in intensity. Cardiology was consulted for the irregular rhythm which appeared to be due to PACs and ST depressions in the lateral leads which were new. Patient also had elevated troponin.  The symptoms of chest pain have been getting worse in the last 6 months.  The chest pain was similar to his chest pain in 2011. It was sharp in character, present in the precordial region, radiated around the chest and was associated with mild SOB. Patient denied nausea, vomiting, cough, diarrhea, constipation otherwise.  He was attributing it to his insulin dose. He has several allergies and usually manages his medications himself.  Of note, in 2011, patient presented with unstable angina and underwent PCI to prox SVG-OM:  Promus 3.0 mm x 28 mm;; b. 11/'11: NSTEMI: Guideliner assited 2 site PCI SVG-OM: distal - 2.75 mm x 12 mm (3.65mm), mid 3.0 mm x 28 mm (3.5 mm);; c. 06/25/11: Class IV Angina - dSVG-OM 90% ISR @ 1st anastomosis => PCI 90% to 0% with Promus DES 2.75 mm x 12 mm.  Problem List: Past Medical History  Diagnosis Date  . Myocardial infarction [name]) 1995    "total of ~ 15; 3 landed me in the hospital"; 1995 MI, 2008 NSTEMI, 08/2009 NSTEMI  . Chronic Stable Angina     Chronic -- Likely combination of small vessel disease as well as grade 3 diastolic dysfunction;; Not many PCI options left: Medical Management  . SOB (shortness of breath) on exertion     "w/angina, when lying down"  . CAD (coronary artery disease), native coronary artery 1995    Initial CABG 1985, redo CABG 2008; as of 2008 - Native RCA & LM 100%  . 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   . Diastolic dysfunction with chronic heart failure (HCC) 2011    Grade 2-3 Diastolic dysfunction; Echo in 7829 - Grade 3 DD with Mod LVH; b. Echo 02/2012: EF 50-55%, Mild-Mod Conc LVH > Gr 2 DD (by images), Mild Ao Sclerosis.  . Pulmonary HTN, with nighttime oxygen 06/25/2011    "at night when I sleep & sometimes during the day; for my angina"  . S/P coronary  artery stent placement 08/2009, 03/2010, 06/2011    a. 4/'11: UA - PCI to prox SVG-OM: Promus 3.0 mm x 28 mm;; b. 11/'11: NSTEMI: Guideliner assited 2 site PCI SVG-OM: distal - 2.75 mm x 12 mm (3.72mm), mid 3.0 mm x 28 mm (3.5 mm);; c. 06/25/11: Class IV Angina - dSVG-OM 90% ISR @ 1st anastomosis => PCI 90% to 0% with Promus DES 2.75 mm x 12 mm (3.0) [Z95.5]  . Contrast dye induced nephropathy, history of in setting of sepsis and dehydration 06/25/2011    History of  . CKD (chronic kidney disease) stage 2, GFR 60-89 ml/min  06/25/2011  . Essential hypertension 06/25/2011  . Dyslipidemia, with allery to statins 06/25/2011    Also intolerant of fenofibrate  . Chronic cutaneous venous stasis ulcer (HCC)     Bilateral Chronic venous stasis ulcers making it difficult to wear compression stockings.  . Obesity hypoventilation syndrome (HCC)   . Carotid artery plaque     CAROTID DOPPLER, 07/29/2012 - R ICA-0-49% diameter reduction, L Bulb/Proximal ICA-50-69% diameter reduction  . 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 Bilateral diabetic retinopathy (HCC)   . History of acute renal failure 12/2010    S/P "cillin and/or dye from CT scan"  . Osteoarthritis of both knees     Osteoarthritis of both knees, at times he must use a wheelchair. [M17.0]  . GERD (gastroesophageal reflux disease)     Past Surgical History  Procedure Laterality Date  . Coronary angioplasty with stent placement  April 2011    Distal SVG to OM -- Promus DES 3.0 mm 23 mm  . Tonsillectomy and adenoidectomy      "as a kid"  . Appendectomy      "as a kid"  . Coronary angioplasty with stent placement  03/08/2010    Repeat PCI space--complex procedure using Guideliner: 2 site PCI SVG to OM1-OM 2: 2.75 mm 12 mm Promus DDS distally, 3.5 mm 20 mm proximally.  . Coronary angioplasty with stent placement  06/25/2011    PCI to the very distal segment/anastomotic lesion of the SVG-OM1-OM 2. 2.75 mm 12 mm probes DES stent OM1 all but occluded/atretic. OM 2 widely patent.  . Coronary artery bypass graft  1996    CABG X4; LIMA-LAD, SVG-diagonal, SVG-OM1-L2, SVG-distal RCA.  Marland Kitchen Coronary artery bypass graft  2008    CABG X2; free radial from the would've SVG-OM 2 distal OM, and redo SVG-RCA -- both grafts occluded several months later. Time down for third redo CABG  . Cardiac catheterization  08/10/2009    Distal SVG 90% stenosis, stented with a 3.0x36mm Promus stent resulting in brisk TIMI III flow  . Cardiac catheterization   10/02/2006    Medical management  . Cardiac catheterization  05/13/2006    Recommended CABG  . Cardiac catheterization  06/06/1994    Severe native CAD, widely patent SVG grafts, normal LV systolic function  . Nuclear stress test  01/21/2002    Resting ECG demonstrates normal sinus rhythm, stress ECG non-diagnostic for ischemia]  . 2d echocardiogram  03/04/2012    EF 50-55%, normal-mild  . Left heart catheterization with coronary/graft angiogram  06/25/2011    Procedure: LEFT HEART CATHETERIZATION WITH Isabel Caprice;  Surgeon: Marykay Lex, MD;  Location: [facility] C[facility] LAB;  Service: Cardiovascular;;     Allergies:  Allergies  Allergen Reactions  . Ioxaglate Other (See Comments)    "found out in August 2012; I'm allergic to dye  used in CT scans; causes lots of edema"  . Ivp Dye [Iodinated Diagnostic Agents] Other (See Comments)    "found out in August 2012; I'm allergic to dye used in CT scans; causes lots of edema"  . Statins Other (See Comments)    Muscle ache; "rhabdomyolysis"  . Ranexa [Ranolazine]     Jittery,as well as constipation and bleeding hemorrhoids.  . Adhesive [Tape] Itching and Rash  . Penicillins Swelling and Rash    Has patient had a PCN reaction causing immediate rash, facial/tongue/throat swelling, SOB or lightheadedness with hypotension: No Has patient had a PCN reaction causing severe rash involving mucus membranes or skin necrosis: No Has patient had a PCN reaction that required hospitalization No Has patient had a PCN reaction occurring within the last 10 years: No If all of the above answers are "NO", then may proceed with Cephalosporin use.  . Tears Again [Artificial Tears] Rash and Other (See Comments)    Burning eyes and skin around eyes     Home Medications Current Facility-Administered Medications  Medication Dose Route Frequency Provider Last Rate Last Dose  . acetaminophen (TYLENOL) tablet 650 mg  650 mg Oral Q4H PRN Bonney AidAlyssa A Haney, MD      .  Melene Muller[START ON 02/24/2015] antiseptic oral rinse (CPC / CETYLPYRIDINIUM CHLORIDE 0.05%) solution 7 mL  7 mL Mouth Rinse q12n4p Alyssa A Haney, MD      . aspirin chewable tablet 81 mg  81 mg Oral Daily Bonney AidAlyssa A Haney, MD   81 mg at 02/23/15 2050  . chlorhexidine (PERIDEX) 0.12 % solution 15 mL  15 mL Mouth Rinse BID Bonney AidAlyssa A Haney, MD   15 mL at 02/23/15 2136  . clopidogrel (PLAVIX) tablet 75 mg  75 mg Oral QHS Bonney AidAlyssa A Haney, MD   75 mg at 02/23/15 2049  . [START ON 02/24/2015] colchicine tablet 0.6 mg  0.6 mg Oral Daily Bonney AidAlyssa A Haney, MD      . Melene Muller[START ON 02/24/2015] feeding supplement (ENSURE ENLIVE) (ENSURE ENLIVE) liquid 237 mL  237 mL Oral BID BM Alyssa A Haney, MD      . furosemide (LASIX) tablet 80 mg  80 mg Oral Daily PRN Bonney AidAlyssa A Haney, MD      . gi cocktail (Maalox,Lidocaine,Donnatal)  30 mL Oral TID PRN Bonney AidAlyssa A Haney, MD      . heparin ADULT infusion 100 units/mL (25000 units/250 mL)  1,400 Units/hr Intravenous Continuous Marquita PalmsCorey M Ball, RPH 14 mL/hr at 02/23/15 2230 1,400 Units/hr at 02/23/15 2230  . HYDROmorphone (DILAUDID) tablet 1 mg  1 mg Oral Q3H PRN Bonney AidAlyssa A Haney, MD      . Melene Muller[START ON 02/24/2015] insulin aspart (novoLOG) injection 0-15 Units  0-15 Units Subcutaneous TID WC Alyssa A Haney, MD      . insulin aspart (novoLOG) injection 0-5 Units  0-5 Units Subcutaneous QHS Bonney AidAlyssa A Haney, MD   0 Units at 02/23/15 2050  . insulin NPH Human (HUMULIN N,NOVOLIN N) injection 35 Units  35 Units Subcutaneous Q supper Bonney AidAlyssa A Haney, MD   35 Units at 02/23/15 2130  . isosorbide mononitrate (IMDUR) 24 hr tablet 15 mg  15 mg Oral TID Bonney AidAlyssa A Haney, MD   15 mg at 02/23/15 2048  . lisinopril (PRINIVIL,ZESTRIL) tablet 5 mg  5 mg Oral Daily Bonney AidAlyssa A Haney, MD   5 mg at 02/23/15 2047  . metoprolol (LOPRESSOR) tablet 50 mg  50 mg Oral TID Bonney AidAlyssa A Haney, MD   50 mg  at 02/23/15 2049  . morphine 2 MG/ML injection 1 mg  1 mg Intravenous Q1H PRN Bonney Aid, MD   1 mg at 02/23/15 2207  . nitroGLYCERIN  (NITROSTAT) SL tablet 0.4 mg  0.4 mg Sublingual Q5 min PRN Bonney Aid, MD   0.4 mg at 02/23/15 2207  . nitroGLYCERIN 50 mg in dextrose 5 % 250 mL (0.2 mg/mL) infusion  0-200 mcg/min Intravenous Titrated Bonney Aid, MD 1.5 mL/hr at 02/23/15 2225 5 mcg/min at 02/23/15 2225  . ondansetron (ZOFRAN) injection 4 mg  4 mg Intravenous Q6H PRN Bonney Aid, MD         No family history on file.   Social History   Social History  . Marital Status: Married    Spouse Name: N/A  . Number of Children: N/A  . Years of Education: N/A   Occupational History  . Not on file.   Social History Main Topics  . Smoking status: Former Smoker -- 2.00 packs/day    Quit date: 03/19/1985  . Smokeless tobacco: Never Used  . Alcohol Use: Yes     Comment: 06/25/11 "rarely have a drink"  . Drug Use: No  . Sexual Activity: No   Other Topics Concern  . Not on file   Social History Narrative   Usually he comes in with a wheelchair. He is very happy walking today. Does not get much exercise due to that. He is hoping to do his water aerobics this summer.      Is a married father 64, grandfather to. The previous he started doing Silver sneakers but was really bothered by his knees. He been doing very well with weight loss but has altered somewhat with that..      Primary care provider is Dr. Mardi Mainland, [facility].  Phone number: [phone]     Review of Systems: General: negative for chills, fever, night sweats or weight changes.  Cardiovascular: chest pain, dyspnea negative for dyspnea on exertion, edema, orthopnea, palpitations, paroxysmal nocturnal dyspnea Dermatological: positive for ulcers/rash Respiratory: negative for cough or wheezing Urologic: negative for hematuria Abdominal: negative for nausea, vomiting, diarrhea, bright red blood per rectum, melena, or hematemesis Neurologic: negative for visual changes, syncope, or dizziness Endocrine: no diabetes, no  hypothyroidism Immunological: no lymph adenopathy Psych: non homicidal/suicidal   Physical Exam: Vitals: BP 136/61 mmHg  Pulse 87  Temp(Src) 98.6 F (37 C) (Oral)  Resp 21  Ht  (1.753 m)  Wt 125.465 kg (276 lb 9.6 oz)  BMI 40.83 kg/m2  SpO2 98% General: not in acute distress Neck: JVP flat, neck supple Heart: regular rate and rhythm, S1, S2, no murmurs  Lungs: CTAB  GI: non tender, non distended, bowel sounds present Extremities: no edema Neuro: AAO x 3  Psych: normal affect, no anxiety   Labs:   Results for orders placed or performed during the hospital encounter of 02/23/15 (from the past 24 hour(s))  CBC with Differential/Platelet     Status: Abnormal   Collection Time: 02/23/15 12:20 PM  Result Value Ref Range   WBC 13.5 (H) 4.0 - 10.5 K/uL   RBC 4.20 (L) 4.22 - 5.81 MIL/uL   Hemoglobin 11.5 (L) 13.0 - 17.0 g/dL   HCT 09.8 (L) 11.9 - 14.7 %   MCV 83.8 78.0 - 100.0 fL   MCH 27.4 26.0 - 34.0 pg   MCHC 32.7 30.0 - 36.0 g/dL   RDW 82.9 56.2 - 13.0 %   Platelets  277 150 - 400 K/uL   Neutrophils Relative % 81 %   Neutro Abs 10.9 (H) 1.7 - 7.7 K/uL   Lymphocytes Relative 12 %   Lymphs Abs 1.6 0.7 - 4.0 K/uL   Monocytes Relative 7 %   Monocytes Absolute 0.9 0.1 - 1.0 K/uL   Eosinophils Relative 0 %   Eosinophils Absolute 0.1 0.0 - 0.7 K/uL   Basophils Relative 0 %   Basophils Absolute 0.0 0.0 - 0.1 K/uL  Comprehensive metabolic panel     Status: Abnormal   Collection Time: 02/23/15 12:20 PM  Result Value Ref Range   Sodium 134 (L) 135 - 145 mmol/L   Potassium 3.6 3.5 - 5.1 mmol/L   Chloride 97 (L) 101 - 111 mmol/L   CO2 28 22 - 32 mmol/L   Glucose, Bld 164 (H) 65 - 99 mg/dL   BUN 19 6 - 20 mg/dL   Creatinine, Ser 8.29 0.61 - 1.24 mg/dL   Calcium 8.7 (L) 8.9 - 10.3 mg/dL   Total Protein 6.2 (L) 6.5 - 8.1 g/dL   Albumin 2.5 (L) 3.5 - 5.0 g/dL   AST 21 15 - 41 U/L   ALT 19 17 - 63 U/L   Alkaline Phosphatase 99 38 - 126 U/L   Total Bilirubin 1.2 0.3 -  1.2 mg/dL   GFR calc non Af Amer >60 >60 mL/min   GFR calc Af Amer >60 >60 mL/min   Anion gap 9 5 - 15  CK     Status: None   Collection Time: 02/23/15 12:20 PM  Result Value Ref Range   Total CK 181 49 - 397 U/L  Troponin I     Status: Abnormal   Collection Time: 02/23/15 12:20 PM  Result Value Ref Range   Troponin I 0.05 (H) <0.031 ng/mL  Brain natriuretic peptide     Status: Abnormal   Collection Time: 02/23/15 12:20 PM  Result Value Ref Range   B Natriuretic Peptide 236.8 (H) 0.0 - 100.0 pg/mL  Sedimentation rate     Status: Abnormal   Collection Time: 02/23/15 12:20 PM  Result Value Ref Range   Sed Rate 102 (H) 0 - 16 mm/hr  C-reactive protein     Status: Abnormal   Collection Time: 02/23/15 12:20 PM  Result Value Ref Range   CRP 28.9 (H) <1.0 mg/dL  I-Stat CG4 Lactic Acid, ED     Status: None   Collection Time: 02/23/15 12:34 PM  Result Value Ref Range   Lactic Acid, Venous 1.50 0.5 - 2.0 mmol/L  Synovial cell count + diff, w/ crystals     Status: Abnormal   Collection Time: 02/23/15  2:40 PM  Result Value Ref Range   Color, Synovial ORANGE (A) YELLOW   Appearance-Synovial TURBID (A) CLEAR   Crystals, Fluid INTRACELLULAR MONOSODIUM URATE CRYSTALS    WBC, Synovial 56213 (H) 0 - 200 /cu mm   Neutrophil, Synovial 97 (H) 0 - 25 %   Lymphocytes-Synovial Fld 2 0 - 20 %   Monocyte-Macrophage-Synovial Fluid 1 (L) 50 - 90 %  Gram stain     Status: None   Collection Time: 02/23/15  2:41 PM  Result Value Ref Range   Specimen Description SYNOVIAL RIGHT KNEE    Special Requests NONE    Gram Stain      ABUNDANT WBC PRESENT, PREDOMINANTLY PMN NO ORGANISMS SEEN    Report Status 02/23/2015 FINAL   Uric acid     Status: Abnormal  Collection Time: 02/23/15  8:14 PM  Result Value Ref Range   Uric Acid, Serum 8.4 (H) 4.4 - 7.6 mg/dL  Troponin I-serum (0, 3, 6 hours)     Status: Abnormal   Collection Time: 02/23/15  8:14 PM  Result Value Ref Range   Troponin I 0.17 (H) <0.031  ng/mL  Glucose, capillary     Status: Abnormal   Collection Time: 02/23/15  8:28 PM  Result Value Ref Range   Glucose-Capillary 166 (H) 65 - 99 mg/dL     Radiology/Studies: Dg Tibia/fibula Left  02/23/2015  CLINICAL DATA:  Lower extremity cellulitis and pain EXAM: LEFT TIBIA AND FIBULA - 2 VIEW COMPARISON:  None. FINDINGS: Degenerative changes are noted in the medial joint space. No acute fracture or dislocation is noted. No joint effusion is seen. On the lateral film distally there is suggestion of an anterior soft tissue ulcer. Clinical correlation is recommended. No other soft tissue abnormality is seen. IMPRESSION: No acute bony abnormality noted. Possible soft tissue ulcer distally. Electronically Signed   By: Alcide Clever M.D.   On: 02/23/2015 13:51   Dg Tibia/fibula Right  02/23/2015  CLINICAL DATA:  Cellulitis EXAM: RIGHT TIBIA AND FIBULA - 2 VIEW COMPARISON:  None. FINDINGS: Severe degenerative change of the knee joint with narrowing of the medial compartment. No acute fracture. No dislocation. Degenerative changes at the ankle. Small bony density projects over the central knee joint. Loose body is not excluded. IMPRESSION: No acute bony pathology.  Chronic changes. Electronically Signed   By: Jolaine Click M.D.   On: 02/23/2015 13:46   Dg Knee Complete 4 Views Right  02/23/2015  CLINICAL DATA:  Bilateral cellulitis with redness, swelling and pain. EXAM: RIGHT KNEE - COMPLETE 4+ VIEW COMPARISON:  None. FINDINGS: There are tricompartmental degenerative changes most prominent over the medial compartment. Evidence of a small joint effusion. Mild subcutaneous edema. No air within the soft tissues. No fracture or dislocation. Multiple surgical clips over the medial soft tissues. Calcified atherosclerotic plaque is seen over the femoral, popliteal and lower leg runoff arteries. IMPRESSION: No acute findings. 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 Small joint effusion.  Electronically Signed   By: Elberta Fortis M.D.   On: 02/23/2015 13:45    EKG: normal sinus rhythm with PACs, blocked and conducted with ST depressions in the inferolateral leads  Echo: 2011, presernved LVEF, grade 3 DD, mild to mod concentric LVH, mild aortic sclerosis, dilated LA  Cardiac cath: 2011 with PCI to prox SVG-OM: Promus 3.0 mm x 28 mm;; b. 11/'11: NSTEMI: Guideliner assited 2 site PCI SVG-OM: distal - 2.75 mm x 12 mm (3.98mm), mid 3.0 mm x 28 mm (3.5 mm);; c. 06/25/11: Class IV Angina - dSVG-OM 90% ISR @ 1st anastomosis => PCI 90% to 0% with Promus DES 2.75 mm x 12 mm   Medical decision making:  Discussed care with the patient Discussed care with the physician on the phone Reviewed labs and imaging personally Reviewed prior records  ASSESSMENT AND PLAN:  This is a 69 y.o. male with CAD s/p CABG x 2 (redo in 2008) w/multiple PCIs last in 2011 (LIMA-LAD patent, SVG-OM1-2 patent (with PCI on 3 separate occasions), SVG-RCA & SVG-D1 occluded; from 2008: fRad-OM 100%, SVG-RCA 100%), diastolic heart failure Grade III, pulmonary hypertension (on oxygen at night), last NSTEMI in 08/2009, DM2, HTN, HDL, chronic venous ulcers and chronic stasis dermatitis who presented with chest pain to the ED.   Active Problems:   Chest  pain  Unstable angina/angina at rest increasing episodes of chest pain, prior high risk history, on antianginals - recommend IV heparin  - cycle troponin - echocardiogram in the AM - consider cardiac cath  - NPO post midnight - CBC, CMP, INR/PTT - TSH, HbA1c, lipid panel - cont plavix, aspirin, is statin intolerant - consider ezetimibe - increase dose of metoprolol and cont imdur  Chronic diastolic heart failure Metolazone was stopped due to gout. Daily weights, strict I/Os and restart metolazone when symptoms stabilize Lisinopril 5 mg    Signed, Joellyn Rued, MD MS 02/23/2015, 10:48 PM

## 2015-02-23 NOTE — Progress Notes (Addendum)
ANTICOAGULATION CONSULT NOTE - Initial Consult  Pharmacy Consult for heparin Indication: chest pain/ACS  Allergies  Allergen Reactions  . Ioxaglate Other (See Comments)    "found out in August 2012; I'm allergic to dye used in CT scans; causes lots of edema"  . Ivp Dye [Iodinated Diagnostic Agents] Other (See Comments)    "found out in August 2012; I'm allergic to dye used in CT scans; causes lots of edema"  . Statins Other (See Comments)    Muscle ache; "rhabdomyolysis"  . Ranexa [Ranolazine]     Jittery,as well as constipation and bleeding hemorrhoids.  . Adhesive [Tape] Itching and Rash  . Penicillins Swelling and Rash    Has patient had a PCN reaction causing immediate rash, facial/tongue/throat swelling, SOB or lightheadedness with hypotension: No Has patient had a PCN reaction causing severe rash involving mucus membranes or skin necrosis: No Has patient had a PCN reaction that required hospitalization No Has patient had a PCN reaction occurring within the last 10 years: No If all of the above answers are "NO", then may proceed with Cephalosporin use.  . Tears Again [Artificial Tears] Rash and Other (See Comments)    Burning eyes and skin around eyes    Patient Measurements: Height:  (175.3 cm) Weight: 276 lb 9.6 oz (125.465 kg) IBW/kg (Calculated) : 70.7 Heparin Dosing Weight: 99.5 kg  Vital Signs: Temp: 98.6 F (37 C) (10/21 2000) Temp Source: Oral (10/21 2000) BP: 136/61 mmHg (10/21 2000) Pulse Rate: 87 (10/21 1745)  Labs:  Recent Labs  02/23/15 1220 02/23/15 2014  HGB 11.5*  --   HCT 35.2*  --   PLT 277  --   CREATININE 1.01  --   CKTOTAL 181  --   TROPONINI 0.05* 0.17*    Estimated Creatinine Clearance: 89.1 mL/min (by C-G formula based on Cr of 1.01).   Medical History: Past Medical History  Diagnosis Date  . Myocardial infarction [facility]) 1995    "total of ~ 15; 3 landed me in the hospital"; 1995 MI, 2008 NSTEMI, 08/2009 NSTEMI  . Chronic  Stable Angina     Chronic -- Likely combination of small vessel disease as well as grade 3 diastolic dysfunction;; Not many PCI options left: Medical Management  . SOB (shortness of breath) on exertion     "w/angina, when lying down"  . CAD (coronary artery disease), native coronary artery 1995    Initial CABG 1985, redo CABG 2008; as of 2008 - Native RCA & LM 100%  . 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   . Diastolic dysfunction with chronic heart failure (HCC) 2011    Grade 2-3 Diastolic dysfunction; Echo in 1610 - Grade 3 DD with Mod LVH; b. Echo 02/2012: EF 50-55%, Mild-Mod Conc LVH > Gr 2 DD (by images), Mild Ao Sclerosis.  . Pulmonary HTN, with nighttime oxygen 06/25/2011    "at night when I sleep & sometimes during the day; for my angina"  . S/P coronary artery stent placement 08/2009, 03/2010, 06/2011    a. 4/'11: UA - PCI to prox SVG-OM: Promus 3.0 mm x 28 mm;; b. 11/'11: NSTEMI: Guideliner assited 2 site PCI SVG-OM: distal - 2.75 mm x 12 mm (3.26mm), mid 3.0 mm x 28 mm (3.5 mm);; c. 06/25/11: Class IV Angina - dSVG-OM 90% ISR @ 1st  anastomosis => PCI 90% to 0% with Promus DES 2.75 mm x 12 mm (3.0) [Z95.5]  . Contrast dye induced nephropathy, history of in setting of sepsis and dehydration 06/25/2011    History of  . CKD (chronic kidney disease) stage 2, GFR 60-89 ml/min 06/25/2011  . Essential hypertension 06/25/2011  . Dyslipidemia, with allery to statins 06/25/2011    Also intolerant of fenofibrate  . Chronic cutaneous venous stasis ulcer (HCC)     Bilateral Chronic venous stasis ulcers making it difficult to wear compression stockings.  . Obesity hypoventilation syndrome (HCC)   . Carotid artery plaque     CAROTID DOPPLER, 07/29/2012 - R ICA-0-49% diameter reduction, L Bulb/Proximal ICA-50-69% diameter  reduction  . 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 Bilateral diabetic retinopathy (HCC)   . History of acute renal failure 12/2010    S/P "cillin and/or dye from CT scan"  . Osteoarthritis of both knees     Osteoarthritis of both knees, at times he must use a wheelchair. [M17.0]  . GERD (gastroesophageal reflux disease)     Medications:  Prescriptions prior to admission  Medication Sig Dispense Refill Last Dose  . B Complex Vitamins (B COMPLEX PO) Take 2 capsules by mouth daily.    over 30 days  . CINNAMON PO Take 1,000 mg by mouth 2 (two) times daily. With chrondtin   02/23/2015 at Unknown time  . clopidogrel (PLAVIX) 75 MG tablet Take 75 mg by mouth at bedtime.    02/22/2015 at Unknown time  . Coenzyme Q10 (COQ10) 200 MG CAPS Take 200 mg by mouth daily.   02/23/2015 at Unknown time  . diclofenac sodium (VOLTAREN) 1 % GEL Apply 2 g topically 4 (four) times daily as needed. (Patient taking differently: Apply 2 g topically 4 (four) times daily as needed (pain, inflammation). ) 1 Tube 1 Past Week at Unknown time  . Ergocalciferol (VITAMIN D2) 2000 UNITS TABS Take 6,000 mg by mouth daily.   02/23/2015 at Unknown time  . furosemide (LASIX) 40 MG tablet Take 1 to 2 tablet  a day for swelling. (Patient taking differently: Take 40 mg by mouth daily as needed for fluid or edema. Take 1 to 2 tablet  a day for swelling.) 60 tablet 6 Past Week at Unknown time  . Grape Seed 50 MG CAPS Take 50 mg by mouth daily.   02/23/2015 at Unknown time  . Hyaluronic Acid-Vitamin C (HYALURONIC ACID PO) Take 100 mg by mouth daily.    over 30 days  . ibuprofen (ADVIL,MOTRIN) 200 MG tablet Take 200 mg by mouth 3 (three) times daily. For inflamation   02/23/2015 at Unknown time  . insulin aspart (NOVOLOG) 100 UNIT/ML injection Inject into the skin See admin instructions. 5 grams carbs = 1 unit,  11 gram protein = 1 unit,    10 points over 100 cbg = 1 unit   02/22/2015 at Unknown time   . insulin NPH (HUMULIN N,NOVOLIN N) 100 UNIT/ML injection Inject 35-40 Units into the skin daily.    02/22/2015 at Unknown time  . isosorbide mononitrate (IMDUR) 30 MG 24 hr tablet Take 15 mg by mouth 3 (three) times daily.    02/23/2015 at Unknown time  . Magnesium Oxide 250 MG TABS Take 500 mg by mouth daily.    02/23/2015 at Unknown time  . metolazone (ZAROXOLYN) 5 MG tablet Take 5 mg by mouth daily. For fluid   02/23/2015 at Unknown time  .  metoprolol (LOPRESSOR) 50 MG tablet Take 1 tablet (50 mg total) by mouth 3 (three) times daily. (Patient taking differently: Take 25-50 mg by mouth 3 (three) times daily. Take 50 mg in the morning, 25 in the afternoon, and 50 mg at bedtime) 90 tablet 5 02/23/2015 at 800  . nitroGLYCERIN (NITROSTAT) 0.4 MG SL tablet Place 0.4 mg under the tongue every 5 (five) minutes as needed. For chest pain   02/23/2015 at Unknown time  . potassium chloride SA (K-DUR,KLOR-CON) 20 MEQ tablet Take 30-40 mEq by mouth daily. Take 40 meq 3 times a week,and 30 meq 4 times aweek   02/23/2015 at Unknown time  . Saw Palmetto, Serenoa repens, 450 MG CAPS Take 450 mg by mouth daily.   02/23/2015 at Unknown time  . lisinopril (PRINIVIL,ZESTRIL) 5 MG tablet Take 1 tablet (5 mg total) by mouth daily. (Patient not taking: Reported on 02/23/2015) 90 tablet 3 Not Taking at Unknown time    Assessment: 69 yo M with CP and PVCs.  Pharmacy consulted to dose heparin for r/o ACS.  He has just received LMWH 40 mg sq for VTE px. ECG with new Twave inversions concerning for MI. Wt 125.5 kg, HDW 99.5 kg. Troponins 0.05 and 0.17. H/H 11.5/35.2 pltc 277. Creat 1.01   Goal of Therapy:  Heparin level 0.3-0.7 units/ml Monitor platelets by anticoagulation protocol: Yes   Plan:  Give 4000 units bolus x 1 Start heparin infusion at 1400 units/hr Check anti-Xa level in 8 hours and daily while on heparin Continue to monitor H&H and platelets  Herby Abraham, Pharm.D. [phone] 02/23/2015 10:19  PM

## 2015-02-23 NOTE — Telephone Encounter (Signed)
Pt's wife called in wanting to speak with Jasmine DecemberSharon about the pt, swelling in his legs, and possibly wanting to admit him to the hospital   Thanks

## 2015-02-23 NOTE — ED Notes (Signed)
Due to ulcers, pt cannot tolerate socks. Socks tied to bed rail for indication. FALL RISK band placed on pt right wrist

## 2015-02-23 NOTE — Telephone Encounter (Signed)
Spoke with pt and wife, for the last 5 days he has had pain from the knee down to the ankle and then back up into the thigh. There is no swelling or discoloration of the leg. It is at the point now he is unable to put weight on that leg. Will discuss with dr harding.

## 2015-02-23 NOTE — Telephone Encounter (Signed)
Discussed with dr harding, sounds more like an orthopedic problem. Spoke with pt, he now reports he is taking NTG because of chest pain. Pt is going to call EMS to help get transportation to the hosp, he reports he can not get to the car. Pt advised going to the hosp is the best option.

## 2015-02-23 NOTE — Progress Notes (Signed)
Pt complaining of central chest pain of 8/10. Paged Dr, EKG pulled, nitro SL ordered. EKG showed Afib with Right BBB, Dr informed. Will continue to monitor

## 2015-02-23 NOTE — Progress Notes (Addendum)
CTSP for worsening chest pain  Pt reports severe non radiating chest pain, worse than his base line angina. He denies nausea, lightheadedness, SOB. Rates pain 8/10. EKG was performed which was initially concerning for afib but after review with cardiology fellow, felt he was actually having PVCs but did have new T wave inversions  BP 136/61 mmHg  Pulse 87  Temp(Src) 98.6 F (37 C) (Oral)  Resp 21  Ht 5\' 9"  (1.753 m)  Wt 276 lb 9.6 oz (125.465 kg)  BMI 40.83 kg/m2  SpO2 98%  Gen: Moderate distress, intermittently clutching chest CV: irregular rhythm, normal rate Pulm: CTAB, increased WOB  A/P Worsening chest pain with significant cardiac history, concern for MI. He additionally has new Twave inversions on EKG concerning for NTEMI, troponins trendng up from 0.05 --> 0.17, however currently hemodynamically stable - stat troponin now - start nitro drip, + heparin drip  - continue to follow along with cardiolology - morphine for chest pain - will obtain port CXR now - will need echo in AM - if concern for unstable hemodynamic status, will transfer to step down  Will continue to monitor closely   Ashish Rossetti A. Kennon RoundsHaney MD, MS Family Medicine Resident PGY-2 Pager [phone]

## 2015-02-23 NOTE — Progress Notes (Signed)
Preliminary results by tech - Right Lower Ext. Venous Completed. No evidence of deep or superficial vein thrombosis in the veins that were clearly visualized. Peroneal veins were clearly visualized in its entirety. Annmargaret Decaprio, BS, RDMS, RVT  .

## 2015-02-23 NOTE — ED Provider Notes (Signed)
CSN: [id number]     Arrival date & time 02/23/15  1124 History   First MD Initiated Contact with Patient 02/23/15 1132     Chief Complaint  Patient presents with  . Leg Pain     (Consider location/radiation/quality/duration/timing/severity/associated sxs/prior Treatment) HPI Comments: Patient presents to the emergency department for evaluation of right leg pain. 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 This is cared for at the [place]. Patient reports that in the last week, however, he has developed severe pain in the right leg that seems to be centered around the right knee. He does have a history of severe diffuse arthritis, including in both knees. Patient reports that for the last 4 days he has been unable to ambulate. Normally he walks without assistance or with a walker. For the last 4 days he has been sitting in an office chair in his house unable to get up. Patient reports severe pain in the knee that shoots down his leg and up to the groin whenever he moves it. He cannot put any weight on the leg. He denies any injury.  She also reports that he has a significant cardiac history and has been experiencing increased chest pain over the last few days secondary to the stress. He has required multiple nitroglycerin for this chest pain. He is not currently expressing chest pain.  Patient is a 69 y.o. male presenting with leg pain.  Leg Pain   Past Medical History  Diagnosis Date  . Myocardial infarction [facility]) 1995    "total of ~ 15; 3 landed me in the hospital"; 1995 MI, 2008 NSTEMI, 08/2009 NSTEMI  . Chronic Stable Angina     Chronic -- Likely combination of small vessel disease as well as grade 3 diastolic dysfunction;; Not many PCI options left: Medical Management  . SOB (shortness of breath) on exertion     "w/angina, when lying down"  . CAD (coronary artery disease), native coronary artery 1995    Initial CABG 1985, redo CABG 2008; as of  2008 - Native RCA & LM 100%  . 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   . Diastolic dysfunction with chronic heart failure (HCC) 2011    Grade 2-3 Diastolic dysfunction; Echo in 4098 - Grade 3 DD with Mod LVH; b. Echo 02/2012: EF 50-55%, Mild-Mod Conc LVH > Gr 2 DD (by images), Mild Ao Sclerosis.  . Pulmonary HTN, with nighttime oxygen 06/25/2011    "at night when I sleep & sometimes during the day; for my angina"  . S/P coronary artery stent placement 08/2009, 03/2010, 06/2011    a. 4/'11: UA - PCI to prox SVG-OM: Promus 3.0 mm x 28 mm;; b. 11/'11: NSTEMI: Guideliner assited 2 site PCI SVG-OM: distal - 2.75 mm x 12 mm (3.48mm), mid 3.0 mm x 28 mm (3.5 mm);; c. 06/25/11: Class IV Angina - dSVG-OM 90% ISR @ 1st anastomosis => PCI 90% to 0% with Promus DES 2.75 mm x 12 mm (3.0) [Z95.5]  . Contrast dye induced nephropathy, history of in setting of sepsis and dehydration 06/25/2011    History of  . CKD (chronic kidney disease) stage 2, GFR 60-89 ml/min 06/25/2011  . Essential hypertension 06/25/2011  . Dyslipidemia, with allery to statins 06/25/2011    Also  intolerant of fenofibrate  . Chronic cutaneous venous stasis ulcer (HCC)     Bilateral Chronic venous stasis ulcers making it difficult to wear compression stockings.  . Obesity hypoventilation syndrome (HCC)   . Carotid artery plaque     CAROTID DOPPLER, 07/29/2012 - R ICA-0-49% diameter reduction, L Bulb/Proximal ICA-50-69% diameter reduction  . 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 Bilateral diabetic retinopathy (HCC)   . History of acute renal failure 12/2010    S/P "cillin and/or dye from CT scan"  . Osteoarthritis of both knees     Osteoarthritis of both knees, at times he must use a wheelchair. [M17.0]  . GERD  (gastroesophageal reflux disease)    Past Surgical History  Procedure Laterality Date  . Coronary angioplasty with stent placement  April 2011    Distal SVG to OM -- Promus DES 3.0 mm 23 mm  . Tonsillectomy and adenoidectomy      "as a kid"  . Appendectomy      "as a kid"  . Coronary angioplasty with stent placement  03/08/2010    Repeat PCI space--complex procedure using Guideliner: 2 site PCI SVG to OM1-OM 2: 2.75 mm 12 mm Promus DDS distally, 3.5 mm 20 mm proximally.  . Coronary angioplasty with stent placement  06/25/2011    PCI to the very distal segment/anastomotic lesion of the SVG-OM1-OM 2. 2.75 mm 12 mm probes DES stent OM1 all but occluded/atretic. OM 2 widely patent.  . Coronary artery bypass graft  1996    CABG X4; LIMA-LAD, SVG-diagonal, SVG-OM1-L2, SVG-distal RCA.  Marland Kitchen Coronary artery bypass graft  2008    CABG X2; free radial from the would've SVG-OM 2 distal OM, and redo SVG-RCA -- both grafts occluded several months later. Time down for third redo CABG  . Cardiac catheterization  08/10/2009    Distal SVG 90% stenosis, stented with a 3.0x73mm Promus stent resulting in brisk TIMI III flow  . Cardiac catheterization  10/02/2006    Medical management  . Cardiac catheterization  05/13/2006    Recommended CABG  . Cardiac catheterization  06/06/1994    Severe native CAD, widely patent SVG grafts, normal LV systolic function  . Nuclear stress test  01/21/2002    Resting ECG demonstrates normal sinus rhythm, stress ECG non-diagnostic for ischemia]  . 2d echocardiogram  03/04/2012    EF 50-55%, normal-mild  . Left heart catheterization with coronary/graft angiogram  06/25/2011    Procedure: LEFT HEART CATHETERIZATION WITH Isabel Caprice;  Surgeon: Marykay Lex, MD;  Location: [facility] CATH LAB;  Service: Cardiovascular;;   No family history on file. Social History  Substance Use Topics  . Smoking status: Former Smoker -- 2.00 packs/day    Quit date: 03/19/1985  . Smokeless  tobacco: Never Used  . Alcohol Use: Yes     Comment: 06/25/11 "rarely have a drink"    Review of Systems  Cardiovascular: Positive for chest pain.  Skin: Positive for wound.  All other systems reviewed and are negative.     Allergies  Ioxaglate; Ivp dye; Statins; Ranexa; Adhesive; Penicillins; and Tears again  Home Medications   Prior to Admission medications   Medication Sig Start Date End Date Taking? Authorizing Provider  B Complex Vitamins (B COMPLEX PO) Take 2 capsules by mouth daily.    Yes Historical Provider, MD  CINNAMON PO Take 1,000 mg by mouth 2 (two) times daily. With chrondtin   Yes Historical Provider, MD  clopidogrel (PLAVIX)  75 MG tablet Take 75 mg by mouth at bedtime.    Yes Historical Provider, MD  Coenzyme Q10 (COQ10) 200 MG CAPS Take 200 mg by mouth daily.   Yes Historical Provider, MD  diclofenac sodium (VOLTAREN) 1 % GEL Apply 2 g topically 4 (four) times daily as needed. Patient taking differently: Apply 2 g topically 4 (four) times daily as needed (pain, inflammation).  02/11/13  Yes Luke K Kilroy, PA-C  Ergocalciferol (VITAMIN D2) 2000 UNITS TABS Take 6,000 mg by mouth daily.   Yes Historical Provider, MD  furosemide (LASIX) 40 MG tablet Take 1 to 2 tablet  a day for swelling. Patient taking differently: Take 40 mg by mouth daily as needed for fluid or edema. Take 1 to 2 tablet  a day for swelling. 12/01/14  Yes Marykay Lex, MD  Grape Seed 50 MG CAPS Take 50 mg by mouth daily.   Yes Historical Provider, MD  Hyaluronic Acid-Vitamin C (HYALURONIC ACID PO) Take 100 mg by mouth daily.    Yes Historical Provider, MD  ibuprofen (ADVIL,MOTRIN) 200 MG tablet Take 200 mg by mouth 3 (three) times daily. For inflamation   Yes Historical Provider, MD  insulin aspart (NOVOLOG) 100 UNIT/ML injection Inject into the skin See admin instructions. 5 grams carbs = 1 unit,  11 gram protein = 1 unit,    10 points over 100 cbg = 1 unit   Yes Historical Provider, MD  insulin NPH  (HUMULIN N,NOVOLIN N) 100 UNIT/ML injection Inject 35-40 Units into the skin daily.    Yes Historical Provider, MD  isosorbide mononitrate (IMDUR) 30 MG 24 hr tablet Take 15 mg by mouth 3 (three) times daily.    Yes Historical Provider, MD  Magnesium Oxide 250 MG TABS Take 500 mg by mouth daily.    Yes Historical Provider, MD  metolazone (ZAROXOLYN) 5 MG tablet Take 5 mg by mouth daily. For fluid   Yes Historical Provider, MD  metoprolol (LOPRESSOR) 50 MG tablet Take 1 tablet (50 mg total) by mouth 3 (three) times daily. Patient taking differently: Take 25-50 mg by mouth 3 (three) times daily. Take 50 mg in the morning, 25 in the afternoon, and 50 mg at bedtime 04/04/13  Yes Marykay Lex, MD  nitroGLYCERIN (NITROSTAT) 0.4 MG SL tablet Place 0.4 mg under the tongue every 5 (five) minutes as needed. For chest pain   Yes Historical Provider, MD  potassium chloride SA (K-DUR,KLOR-CON) 20 MEQ tablet Take 30-40 mEq by mouth daily. Take 40 meq 3 times a week,and 30 meq 4 times aweek   Yes Historical Provider, MD  Saw Palmetto, Serenoa repens, 450 MG CAPS Take 450 mg by mouth daily.   Yes Historical Provider, MD  lisinopril (PRINIVIL,ZESTRIL) 5 MG tablet Take 1 tablet (5 mg total) by mouth daily. Patient not taking: Reported on 02/23/2015 12/01/14   Marykay Lex, MD   BP 121/70 mmHg  Pulse 79  Temp(Src) 98.4 F (36.9 C) (Oral)  Resp 18  Ht 5\' 9"  (1.753 m)  Wt 276 lb 9.6 oz (125.465 kg)  BMI 40.83 kg/m2  SpO2 96% Physical Exam  Constitutional: He is oriented to person, place, and time. He appears well-developed and well-nourished. No distress.  HENT:  Head: Normocephalic and atraumatic.  Right Ear: Hearing normal.  Left Ear: Hearing normal.  Nose: Nose normal.  Mouth/Throat: Oropharynx is clear and moist and mucous membranes are normal.  Eyes: Conjunctivae and EOM are normal. Pupils are equal, round, and reactive  to light.  Neck: Normal range of motion. Neck supple.  Cardiovascular: Regular  rhythm, S1 normal and S2 normal.  Exam reveals no gallop and no friction rub.   No murmur heard. Pulmonary/Chest: Effort normal and breath sounds normal. No respiratory distress. He exhibits no tenderness.  Abdominal: Soft. Normal appearance and bowel sounds are normal. There is no hepatosplenomegaly. There is no tenderness. There is no rebound, no guarding, no tenderness at McBurney's point and negative Murphy's sign. No hernia.  Musculoskeletal:       Right knee: He exhibits decreased range of motion, swelling and effusion. He exhibits no deformity and no erythema.  Neurological: He is alert and oriented to person, place, and time. He has normal strength. No cranial nerve deficit or sensory deficit. Coordination normal. GCS eye subscore is 4. GCS verbal subscore is 5. GCS motor subscore is 6.  Skin: Skin is warm. No cyanosis.  Numerous stasis ulcers and dermatitis changes of both lower extremities, right greater than left. Significant amount of clear yellow drainage from wounds  Psychiatric: He has a normal mood and affect. His speech is normal and behavior is normal. Thought content normal.  Nursing note and vitals reviewed.   ED Course  Procedures (including critical care time) Labs Review Labs Reviewed  CBC WITH DIFFERENTIAL/PLATELET - Abnormal; Notable for the following:    WBC 13.5 (*)    RBC 4.20 (*)    Hemoglobin 11.5 (*)    HCT 35.2 (*)    Neutro Abs 10.9 (*)    All other components within normal limits  COMPREHENSIVE METABOLIC PANEL - Abnormal; Notable for the following:    Sodium 134 (*)    Chloride 97 (*)    Glucose, Bld 164 (*)    Calcium 8.7 (*)    Total Protein 6.2 (*)    Albumin 2.5 (*)    All other components within normal limits  TROPONIN I - Abnormal; Notable for the following:    Troponin I 0.05 (*)    All other components within normal limits  BRAIN NATRIURETIC PEPTIDE - Abnormal; Notable for the following:    B Natriuretic Peptide 236.8 (*)    All other  components within normal limits  SEDIMENTATION RATE - Abnormal; Notable for the following:    Sed Rate 102 (*)    All other components within normal limits  C-REACTIVE PROTEIN - Abnormal; Notable for the following:    CRP 28.9 (*)    All other components within normal limits  SYNOVIAL CELL COUNT + DIFF, W/ CRYSTALS - Abnormal; Notable for the following:    Color, Synovial ORANGE (*)    Appearance-Synovial TURBID (*)    WBC, Synovial 16109 (*)    Neutrophil, Synovial 97 (*)    Monocyte-Macrophage-Synovial Fluid 1 (*)    All other components within normal limits  URIC ACID - Abnormal; Notable for the following:    Uric Acid, Serum 8.4 (*)    All other components within normal limits  TROPONIN I - Abnormal; Notable for the following:    Troponin I 0.17 (*)    All other components within normal limits  TROPONIN I - Abnormal; Notable for the following:    Troponin I 0.10 (*)    All other components within normal limits  TROPONIN I - Abnormal; Notable for the following:    Troponin I 0.52 (*)    All other components within normal limits  GLUCOSE, CAPILLARY - Abnormal; Notable for the following:    Glucose-Capillary 166 (*)  All other components within normal limits  HEPARIN LEVEL (UNFRACTIONATED) - Abnormal; Notable for the following:    Heparin Unfractionated 0.10 (*)    All other components within normal limits  CBC - Abnormal; Notable for the following:    WBC 12.3 (*)    Hemoglobin 12.0 (*)    HCT 37.2 (*)    All other components within normal limits  HEMOGLOBIN A1C - Abnormal; Notable for the following:    Hgb A1c MFr Bld 7.2 (*)    All other components within normal limits  LIPID PANEL - Abnormal; Notable for the following:    Cholesterol 214 (*)    HDL 28 (*)    LDL Cholesterol 167 (*)    All other components within normal limits  PROTIME-INR - Abnormal; Notable for the following:    Prothrombin Time 17.1 (*)    All other components within normal limits   COMPREHENSIVE METABOLIC PANEL - Abnormal; Notable for the following:    Sodium 132 (*)    Potassium 3.2 (*)    Chloride 89 (*)    Glucose, Bld 192 (*)    Albumin 2.4 (*)    Alkaline Phosphatase 128 (*)    All other components within normal limits  TROPONIN I - Abnormal; Notable for the following:    Troponin I 1.67 (*)    All other components within normal limits  TROPONIN I - Abnormal; Notable for the following:    Troponin I 1.26 (*)    All other components within normal limits  TROPONIN I - Abnormal; Notable for the following:    Troponin I 1.93 (*)    All other components within normal limits  GLUCOSE, CAPILLARY - Abnormal; Notable for the following:    Glucose-Capillary 173 (*)    All other components within normal limits  HEPARIN LEVEL (UNFRACTIONATED) - Abnormal; Notable for the following:    Heparin Unfractionated <0.10 (*)    All other components within normal limits  GLUCOSE, CAPILLARY - Abnormal; Notable for the following:    Glucose-Capillary 159 (*)    All other components within normal limits  GLUCOSE, CAPILLARY - Abnormal; Notable for the following:    Glucose-Capillary 210 (*)    All other components within normal limits  HEPARIN LEVEL (UNFRACTIONATED) - Abnormal; Notable for the following:    Heparin Unfractionated 0.18 (*)    All other components within normal limits  CBC - Abnormal; Notable for the following:    WBC 10.8 (*)    Hemoglobin 11.7 (*)    HCT 36.4 (*)    All other components within normal limits  BASIC METABOLIC PANEL - Abnormal; Notable for the following:    Sodium 134 (*)    Potassium 3.3 (*)    Chloride 94 (*)    Glucose, Bld 186 (*)    BUN 33 (*)    Creatinine, Ser 1.30 (*)    Calcium 8.8 (*)    GFR calc non Af Amer 54 (*)    All other components within normal limits  TROPONIN I - Abnormal; Notable for the following:    Troponin I 0.78 (*)    All other components within normal limits  TROPONIN I - Abnormal; Notable for the  following:    Troponin I 0.51 (*)    All other components within normal limits  GLUCOSE, CAPILLARY - Abnormal; Notable for the following:    Glucose-Capillary 249 (*)    All other components within normal limits  TROPONIN I - Abnormal;  Notable for the following:    Troponin I 0.41 (*)    All other components within normal limits  HEPARIN LEVEL (UNFRACTIONATED) - Abnormal; Notable for the following:    Heparin Unfractionated 0.26 (*)    All other components within normal limits  GLUCOSE, CAPILLARY - Abnormal; Notable for the following:    Glucose-Capillary 175 (*)    All other components within normal limits  GLUCOSE, CAPILLARY - Abnormal; Notable for the following:    Glucose-Capillary 313 (*)    All other components within normal limits  BASIC METABOLIC PANEL - Abnormal; Notable for the following:    Sodium 132 (*)    Chloride 95 (*)    Glucose, Bld 239 (*)    BUN 34 (*)    All other components within normal limits  CBC - Abnormal; Notable for the following:    RBC 4.19 (*)    Hemoglobin 12.2 (*)    HCT 34.7 (*)    All other components within normal limits  GLUCOSE, CAPILLARY - Abnormal; Notable for the following:    Glucose-Capillary 221 (*)    All other components within normal limits  GLUCOSE, CAPILLARY - Abnormal; Notable for the following:    Glucose-Capillary 251 (*)    All other components within normal limits  GLUCOSE, CAPILLARY - Abnormal; Notable for the following:    Glucose-Capillary 202 (*)    All other components within normal limits  GLUCOSE, CAPILLARY - Abnormal; Notable for the following:    Glucose-Capillary 199 (*)    All other components within normal limits  GLUCOSE, CAPILLARY - Abnormal; Notable for the following:    Glucose-Capillary 279 (*)    All other components within normal limits  GLUCOSE, CAPILLARY - Abnormal; Notable for the following:    Glucose-Capillary 231 (*)    All other components within normal limits  CULTURE, BLOOD (ROUTINE X 2)   CULTURE, BLOOD (ROUTINE X 2)  GRAM STAIN  CULTURE, BODY FLUID-BOTTLE  CK  TSH  HEPARIN LEVEL (UNFRACTIONATED)  BASIC METABOLIC PANEL  CBC  I-STAT CG4 LACTIC ACID, ED    Imaging Review No results found. I have personally reviewed and evaluated these images and lab results as part of my medical decision-making.   EKG Interpretation   Date/Time:  Friday February 23 2015 12:10:12 EDT Ventricular Rate:  77 PR Interval:  171 QRS Duration: 116 QT Interval:  428 QTC Calculation: 484 R Axis:     Text Interpretation:  Sinus rhythm Incomplete right bundle branch block  Nonspecific repol abnormality, lateral leads Confirmed by Lottie Siska  MD,  Loria Lacina [phone]) on 02/23/2015 3:38:05 PM      MDM   Final diagnoses:  Venous stasis ulcer, unspecified laterality (HCC)  Chest pain, unspecified chest pain type  Knee effusion, right    Patient presented to the ER for evaluation of pain in his right leg. Patient reports that the pain has progressed over a period of several days and now he cannot walk on it. Examination did reveal evidence of right knee effusion. There was mild warmth without erythema.  Also expressing chest pain. He does have a history of coronary artery disease. Patient has been using frequent nitroglycerin with resolution of his pain, not currently having active pain.  Will require hospitalization for further evaluation. Arthrocentesis performed, studies pending. Sign out to oncoming ER physician to interpret arthrocentesis studies prior to admission.    Gilda Crease, MD 02/27/15 Jacinta Shoe

## 2015-02-23 NOTE — H&P (Signed)
Family Medicine Teaching [facility] Admission History and Physical Service Pager: [phone]  Patient name: [name] Medical record number: [id number] Date of birth: [birth year] Age: 69 y.o. Gender: male  Primary Care Provider: [facility] Consultants: Cardiology Code Status: Full  Chief Complaint:  Right knee pain, chest pain  Assessment and Plan: [name] is a 69 y.o. male presenting with right knee pain x4-5 days and chest pain . PMH is significant for CAD with CABGx2 with multiple PCIs, G3DD (echo 07/2009), coronary artery stent placement 2013,  Pulm HTN ( home O2 req), DM2, CKD, HTN, HLD, OA, venous stasis with chronic ulcers,   Chest pain/CAD- Chest pain has abated prior to arriving to the ED and he has a history of chronic angina. However his HEART score 7 with multiple risk factors with his history of multiple CABGs and PCIs, and stent placement in 2013. Troponin in ED elevated at 0.05, EKG showed incomplete RBB but unchanged from previous on 12/01/2014. Given elevated troponin in ED, with significant cardiac history, will rule out ACS - Admit to [facility] medicine teaching service, Dr. Gwendolyn Grant Attending - Telemetry - cycle troponin - repeat EKG in AM - aspirin - nitro SL PRN chest pain - continue home plavix - risk stratification labs- TSH, lipid panel, A1c - consult cards in AM or earlier if again has chest pain - Will touch base with Dr Herbie Baltimore in AM  CHF- last echo in [facility] system 03/05/2015 EF 50-55%. BNP 236, no baseline in our system, However, no evidence of worsening edema or crackles on lung exam, resolved SOB with normal O2 saturations. No CXR performed in the ED. Weight 276 on admission base line weight appears 274-280 - will increase home lasix from 40 PO qD to 80 PO qD, will hold home metolazone given risk factor for development of gout - continue home Imdur - has home nightly O2 requirement, continue while here  Knee Pain- Joint  aspirates in ED consistent with Gout with monosodium urate crystals present. No fevers, joint erythema or warmth makes septic arthritis unlikely, mild WBC to 13.5.  - blood cultures were drawn in ED, will follow - for likely acute gout flare, will treat with colchicine at this time  - will collect serum uric acid - tylenol, dilaudid PRN pain  Venous Stasis- RLE dopplers neg for DVT, final read pending. 12/29/2014 had b/l dopplers which were sig for venous insufficency, neg for DVT. Treated with lasix per cardiology, s/p multiple bypass grafts [facility]  - continue Lasix as above  HTN- Blood pressure stable  - continue home lopressor, lisinopril  DM Takes NPH 35-40 units qD, with sliding scale, no A1c in our system - will start mod SSI - continue home NPH 35, titrate as needed - ACBG ACHS  HLD- Last lipid panel 2011, cholersterol 252, Trig 270, DLD 170, HDL 28, Given h/o mulitiple coronary events would start statin, but has previously not been tolerated by pt  CKD- Cr 1.01 today, at baseline - follow Cr  FEN/GI: Heart/ healthy /Carb modified diet, SLIV Prophylaxis: Lovenox ppx  Disposition: Admit to telemetry, Dr Gwendolyn Grant attending  History of Present Illness:  [name] is a 70 y.o. male presenting with worsening right knee pain for the last 4-5 days. As he had chest pain associated, he wife called Dr Herbie Baltimore, his cardiologist who recommended presentation to the ED for work up  He has arthritis in  his knees and has chronic arthritic knee pain but feels this is much worse. His right knee has been swollen, and tender and he has not been able to move it secondary to pain. He reports he has has to stay in his office chair due to inability to transfer secondary to pain. Reportedly last night his wife called EMS to help move him from the chair to his bed. He denies redness, warmth of his knee, fevers or chills at home. He denies ever  having joint pain this severe before.  In the Emergency room, he had fluid tapped from the right knee ( 60 cc per the nurse) and after the pt reports his knee felt somewhat improved but still very painful. He was able to move his knee more comfortably post drainage  During this time he has had intermittent left chest pain located associated with some SOB. He feels that he has been very " stressed" by his knee pain and the chest pain is due to this. This chest pain is not different from his chronic chest pain. He denies associated sweatiness, HA, chest pressure, nausea, vomiting or diarrhea He denies current chest pain, SOB, nausea  Pt states his lower extremity edema and venous stasis stable and [facility]. He has had multiple bypass procedures performed and has recently been told that he is unable to undergo an additional surgery for this due to his multiple m,edical co-morbidities  Review Of Systems: Per HPI  Patient Active Problem List   Diagnosis Date Noted  . Bilateral leg edema 12/07/2014  . Hypotension, iatrogenic 03/02/2014  . S/P CABG x 4 04/23/2013  . DOE (dyspnea on exertion) 02/01/2013  . Morbid obesity - with likely obesity hypoventilation syndrome 10/09/2012  . CAD: CABG '95 with redo '08- s/p multiple PCIs since 06/25/2011  . Stable angina, class 2-3 chronic - it is usually occurring at night but also with moderate exertion 06/25/2011  . Pulmonary HTN, with nighttime oxygen 06/25/2011  . Diabetes mellitus, type Ball, insulin dependent (HCC) 06/25/2011  . CKD (chronic kidney disease) stage 1, GFR 90 ml/min or greater 06/25/2011  . Contrast dye induced nephropathy, history of in setting of sepsis and dehydration 06/25/2011  . Diastolic dysfunction, grade 3 on Echo- March 2011 06/25/2011  . Essential hypertension 06/25/2011  . Status post coronary artery stent placement, 06/25/11 in distal SVG to OM for instent restenosis 90% to 0% with Promus DES  06/25/2011  . Dyslipidemia, with intol to statins 06/25/2011  . Osteoarthritis of both knees, at times he must use a wheelchair. 06/25/2011  . Venous stasis ulcers (HCC) 06/25/2011    Class: Chronic   Past Medical History: Past Medical History  Diagnosis Date  . Myocardial infarction [facility](HCC) 1995    "total of ~ 15; 3 landed me in the hospital"; 1995 MI, 2008 NSTEMI, 08/2009 NSTEMI  . Chronic Stable Angina     Chronic -- Likely combination of small vessel disease as well as grade 3 diastolic dysfunction;; Not many PCI options left: Medical Management  . SOB (shortness of breath) on exertion     "w/angina, when lying down"  . CAD (coronary artery disease), native coronary artery 1995    Initial CABG 1985, redo CABG 2008; as of 2008 - Native RCA & LM 100%  . CAD (coronary artery disease), autologous vein bypass graft 06/2011    From 1995 CABG - LIMA-LAD patent, SVG-OM1-2 patent (with PCI on 3 separate occasions), SVG-RCA &  SVG-D1 occluded; from 2008: fRad-OM 100%, SVG-RCA 100%.   . Diastolic dysfunction with chronic heart failure (HCC) 2011    Grade 2-3 Diastolic dysfunction; Echo in 3086 - Grade 3 DD with Mod LVH; b. Echo 02/2012: EF 50-55%, Mild-Mod Conc LVH > Gr 2 DD (by images), Mild Ao Sclerosis.  . Pulmonary HTN, with nighttime oxygen 06/25/2011    "at night when I sleep & sometimes during the day; for my angina"  . S/P coronary artery stent placement 08/2009, 03/2010, 06/2011    a. 4/'11: UA - PCI to prox SVG-OM: Promus 3.0 mm x 28 mm;; b. 11/'11: NSTEMI: Guideliner assited 2 site PCI SVG-OM: distal - 2.75 mm x 12 mm (3.46mm), mid 3.0 mm x 28 mm (3.5 mm);; c. 06/25/11: Class IV Angina - dSVG-OM 90% ISR @ 1st anastomosis => PCI 90% to 0% with Promus DES 2.75 mm x 12 mm (3.0) [Z95.5]  . Contrast dye induced nephropathy, history of in setting of sepsis and dehydration 06/25/2011    History of  . CKD (chronic kidney disease) stage 2, GFR 60-89 ml/min 06/25/2011  . Essential hypertension 06/25/2011   . Dyslipidemia, with allery to statins 06/25/2011    Also intolerant of fenofibrate  . Chronic cutaneous venous stasis ulcer (HCC)     Bilateral Chronic venous stasis ulcers making it difficult to wear compression stockings.  . Obesity hypoventilation syndrome (HCC)   . Carotid artery plaque     CAROTID DOPPLER, 07/29/2012 - R ICA-0-49% diameter reduction, L Bulb/Proximal ICA-50-69% diameter reduction  . 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 Bilateral diabetic retinopathy (HCC)   . History of acute renal failure 12/2010    S/P "cillin and/or dye from CT scan"  . Osteoarthritis of both knees     Osteoarthritis of both knees, at times he must use a wheelchair. [M17.0]  . GERD (gastroesophageal reflux disease)    Past Surgical History: Past Surgical History  Procedure Laterality Date  . Coronary angioplasty with stent placement  April 2011    Distal SVG to OM -- Promus DES 3.0 mm 23 mm  . Tonsillectomy and adenoidectomy      "as a kid"  . Appendectomy      "as a kid"  . Coronary angioplasty with stent placement  03/08/2010    Repeat PCI space--complex procedure using Guideliner: 2 site PCI SVG to OM1-OM 2: 2.75 mm 12 mm Promus DDS distally, 3.5 mm 20 mm proximally.  . Coronary angioplasty with stent placement  06/25/2011    PCI to the very distal segment/anastomotic lesion of the SVG-OM1-OM 2. 2.75 mm 12 mm probes DES stent OM1 all but occluded/atretic. OM 2 widely patent.  . Coronary artery bypass graft  1996    CABG X4; LIMA-LAD, SVG-diagonal, SVG-OM1-L2, SVG-distal RCA.  Marland Kitchen Coronary artery bypass graft  2008    CABG X2; free radial from the would've SVG-OM 2 distal OM, and redo SVG-RCA -- both grafts occluded several months later. Time down for third redo CABG  . Cardiac catheterization  08/10/2009    Distal SVG 90% stenosis, stented with a 3.0x27mm Promus stent resulting in brisk TIMI III flow  . Cardiac catheterization  10/02/2006    Medical  management  . Cardiac catheterization  05/13/2006    Recommended CABG  . Cardiac catheterization  06/06/1994    Severe native CAD, widely patent SVG grafts, normal LV systolic function  . Nuclear stress test  01/21/2002    Resting ECG  demonstrates normal sinus rhythm, stress ECG non-diagnostic for ischemia]  . 2d echocardiogram  03/04/2012    EF 50-55%, normal-mild  . Left heart catheterization with coronary/graft angiogram  06/25/2011    Procedure: LEFT HEART CATHETERIZATION WITH Isabel Caprice;  Surgeon: Marykay Lex, MD;  Location: [facility] CATH LAB;  Service: Cardiovascular;;   Social History: Social History  Substance Use Topics  . Smoking status: Former Smoker -- 2.00 packs/day    Quit date: 03/19/1985  . Smokeless tobacco: Never Used  . Alcohol Use: Yes     Comment: 06/25/11 "rarely have a drink"   Additional social history: denies tobacco use, "rare" etoh use (takes 6 mo to finish 12 pack of beer), denies drug use Please also refer to relevant sections of EMR.  Family History: No family history on file. Allergies and Medications: Allergies  Allergen Reactions  . Ioxaglate Other (See Comments)    "found out in August 2012; I'm allergic to dye used in CT scans; causes lots of edema"  . Ivp Dye [Iodinated Diagnostic Agents] Other (See Comments)    "found out in August 2012; I'm allergic to dye used in CT scans; causes lots of edema"  . Statins Other (See Comments)    Muscle ache; "rhabdomyolysis"  . Ranexa [Ranolazine]     Jittery,as well as constipation and bleeding hemorrhoids.  . Adhesive [Tape] Itching and Rash  . Penicillins Swelling and Rash   No current facility-administered medications on file prior to encounter.   Current Outpatient Prescriptions on File Prior to Encounter  Medication Sig Dispense Refill  . clopidogrel (PLAVIX) 75 MG tablet Take 75 mg by mouth at bedtime.     . furosemide (LASIX) 40 MG tablet Take 1 to 2 tablet  a day for swelling. (Patient  taking differently: Take 40 mg by mouth daily as needed for fluid or edema. Take 1 to 2 tablet  a day for swelling.) 60 tablet 6  . insulin aspart (NOVOLOG) 100 UNIT/ML injection Inject into the skin See admin instructions. 5 grams carbs = 1 unit,  11 gram protein = 1 unit,    10 points over 100 cbg = 1 unit    . insulin NPH (HUMULIN N,NOVOLIN N) 100 UNIT/ML injection Inject 35-40 Units into the skin daily.     . isosorbide mononitrate (IMDUR) 30 MG 24 hr tablet Take 15 mg by mouth 3 (three) times daily.     . metolazone (ZAROXOLYN) 5 MG tablet Take 5 mg by mouth daily. For fluid    . metoprolol (LOPRESSOR) 50 MG tablet Take 1 tablet (50 mg total) by mouth 3 (three) times daily. (Patient taking differently: Take 25-50 mg by mouth 3 (three) times daily. Take 50 mg in the morning, 25 in the afternoon, and 50 mg at bedtime) 90 tablet 5  . potassium chloride SA (K-DUR,KLOR-CON) 20 MEQ tablet Take 40 meq 3 times a week,and 30 meq 4 times aweek    . B Complex Vitamins (B COMPLEX PO) Take 2 capsules by mouth daily.     Marland Kitchen CINNAMON PO Take 2,000 mg by mouth 2 (two) times daily. With chrondtin    . Coenzyme Q10-Vitamin E 100-100 MG-UNIT CAPS Take 1 capsule by mouth 2 (two) times daily. Take 1 caps twice a day    . diclofenac sodium (VOLTAREN) 1 % GEL Apply 2 g topically 4 (four) times daily as needed. 1 Tube 1  . Grape Seed 50 MG CAPS Take 50 mg by mouth daily.    Marland Kitchen  Hyaluronic Acid-Vitamin C (HYALURONIC ACID PO) Take 100 mg by mouth. TAKE 1 CAPSULE 5 TIMES A WEEK    . ibuprofen (ADVIL,MOTRIN) 200 MG tablet Take 200 mg by mouth every 8 (eight) hours as needed. For inflamation    . lisinopril (PRINIVIL,ZESTRIL) 5 MG tablet Take 1 tablet (5 mg total) by mouth daily. (Patient not taking: Reported on 02/23/2015) 90 tablet 3  . Magnesium Oxide 250 MG TABS Take 2 tablets by mouth daily.     . Multiple Vitamins-Minerals (MULTIVITAMIN & MINERAL PO) Take 1 tablet by mouth daily. Take 1 tab daily    . nitroGLYCERIN  (NITROSTAT) 0.4 MG SL tablet Place 0.4 mg under the tongue every 5 (five) minutes as needed. For chest pain    . Saw Palmetto, Serenoa repens, 450 MG CAPS Take 450 mg by mouth daily.    Marland Kitchen torsemide (DEMADEX) 100 MG tablet Take 50-100 mg by mouth daily as needed. For fluid retension      Objective: BP 137/94 mmHg  Pulse 84  Resp 24  SpO2 93% Exam: General: Sitting in hospital bed, NAD HEENT: PERRL, EOMI, no scleral icterus Cardiovascular: RRR, no murmurs/rubs/gallops Respiratory: CTAB, normal WOB Abdomen: obese, soft, non tender, + BS MSK; small effusion palpable inferio-lateral to the patella, marked tenderness, non eryhematous, no warmth, decreased ROM to passive and active motion of right knee Skin: bilateral lower extremity erythema with edema with scattered ulcerative lesions extending to mid tibia bilaterally, mild yellow drainage from come ulcers Neuro: No focal deficits Psych: normal mood and affect  Labs and Imaging: CBC BMET   Recent Labs Lab 02/23/15 1220  WBC 13.5*  HGB 11.5*  HCT 35.2*  PLT 277    Recent Labs Lab 02/23/15 1220  NA 134*  K 3.6  CL 97*  CO2 28  BUN 19  CREATININE 1.01  GLUCOSE 164*  CALCIUM 8.7*      XR R Knee  IMPRESSION: No acute findings.  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 Small joint effusion.  Bonney Aid, MD 02/23/2015, 4:41 PM PGY-2, [place] FPTS Intern pager: [phone], text pages welcome

## 2015-02-23 NOTE — ED Notes (Signed)
Pt. Presents with complaint of significant R leg pain. Pt. Being treated for bilateral venous ulcers. Pt. With hx of DM, significant bilateral lower edema. Pt. States he was stuck in his chair x 4 days at home, EMS responded to home last PM and helped pt. To bed. Pt. States unable to bear weight on R leg without significant pain up to groin. Pt. States the stress has caused some intermittent CP. Denies CP/SOB at this time. Pt. Of Dr Herbie Baltimoreharding.

## 2015-02-24 ENCOUNTER — Observation Stay (HOSPITAL_BASED_OUTPATIENT_CLINIC_OR_DEPARTMENT_OTHER): Payer: Medicare Other

## 2015-02-24 DIAGNOSIS — N182 Chronic kidney disease, stage 2 (mild): Secondary | ICD-10-CM | POA: Diagnosis present

## 2015-02-24 DIAGNOSIS — L97219 Non-pressure chronic ulcer of right calf with unspecified severity: Secondary | ICD-10-CM | POA: Diagnosis present

## 2015-02-24 DIAGNOSIS — Z955 Presence of coronary angioplasty implant and graft: Secondary | ICD-10-CM | POA: Diagnosis not present

## 2015-02-24 DIAGNOSIS — G8929 Other chronic pain: Secondary | ICD-10-CM | POA: Diagnosis present

## 2015-02-24 DIAGNOSIS — M109 Gout, unspecified: Secondary | ICD-10-CM | POA: Insufficient documentation

## 2015-02-24 DIAGNOSIS — Z951 Presence of aortocoronary bypass graft: Secondary | ICD-10-CM | POA: Diagnosis not present

## 2015-02-24 DIAGNOSIS — I25708 Atherosclerosis of coronary artery bypass graft(s), unspecified, with other forms of angina pectoris: Secondary | ICD-10-CM | POA: Diagnosis not present

## 2015-02-24 DIAGNOSIS — Z87891 Personal history of nicotine dependence: Secondary | ICD-10-CM | POA: Diagnosis not present

## 2015-02-24 DIAGNOSIS — Z794 Long term (current) use of insulin: Secondary | ICD-10-CM | POA: Diagnosis not present

## 2015-02-24 DIAGNOSIS — N181 Chronic kidney disease, stage 1: Secondary | ICD-10-CM

## 2015-02-24 DIAGNOSIS — E1165 Type 2 diabetes mellitus with hyperglycemia: Secondary | ICD-10-CM | POA: Diagnosis present

## 2015-02-24 DIAGNOSIS — L97329 Non-pressure chronic ulcer of left ankle with unspecified severity: Secondary | ICD-10-CM | POA: Diagnosis present

## 2015-02-24 DIAGNOSIS — I214 Non-ST elevation (NSTEMI) myocardial infarction: Secondary | ICD-10-CM | POA: Diagnosis present

## 2015-02-24 DIAGNOSIS — I272 Other secondary pulmonary hypertension: Secondary | ICD-10-CM

## 2015-02-24 DIAGNOSIS — E662 Morbid (severe) obesity with alveolar hypoventilation: Secondary | ICD-10-CM | POA: Diagnosis present

## 2015-02-24 DIAGNOSIS — Z9981 Dependence on supplemental oxygen: Secondary | ICD-10-CM | POA: Diagnosis not present

## 2015-02-24 DIAGNOSIS — I4891 Unspecified atrial fibrillation: Secondary | ICD-10-CM | POA: Diagnosis not present

## 2015-02-24 DIAGNOSIS — I25700 Atherosclerosis of coronary artery bypass graft(s), unspecified, with unstable angina pectoris: Secondary | ICD-10-CM | POA: Diagnosis not present

## 2015-02-24 DIAGNOSIS — I10 Essential (primary) hypertension: Secondary | ICD-10-CM

## 2015-02-24 DIAGNOSIS — Z7902 Long term (current) use of antithrombotics/antiplatelets: Secondary | ICD-10-CM | POA: Diagnosis not present

## 2015-02-24 DIAGNOSIS — I878 Other specified disorders of veins: Secondary | ICD-10-CM | POA: Diagnosis present

## 2015-02-24 DIAGNOSIS — L97519 Non-pressure chronic ulcer of other part of right foot with unspecified severity: Secondary | ICD-10-CM | POA: Diagnosis present

## 2015-02-24 DIAGNOSIS — I25110 Atherosclerotic heart disease of native coronary artery with unstable angina pectoris: Secondary | ICD-10-CM | POA: Diagnosis present

## 2015-02-24 DIAGNOSIS — Z91041 Radiographic dye allergy status: Secondary | ICD-10-CM | POA: Diagnosis not present

## 2015-02-24 DIAGNOSIS — I872 Venous insufficiency (chronic) (peripheral): Secondary | ICD-10-CM | POA: Diagnosis present

## 2015-02-24 DIAGNOSIS — E119 Type 2 diabetes mellitus without complications: Secondary | ICD-10-CM | POA: Diagnosis not present

## 2015-02-24 DIAGNOSIS — M10062 Idiopathic gout, left knee: Secondary | ICD-10-CM | POA: Diagnosis not present

## 2015-02-24 DIAGNOSIS — E11319 Type 2 diabetes mellitus with unspecified diabetic retinopathy without macular edema: Secondary | ICD-10-CM | POA: Diagnosis present

## 2015-02-24 DIAGNOSIS — I252 Old myocardial infarction: Secondary | ICD-10-CM | POA: Diagnosis not present

## 2015-02-24 DIAGNOSIS — M549 Dorsalgia, unspecified: Secondary | ICD-10-CM | POA: Diagnosis present

## 2015-02-24 DIAGNOSIS — K219 Gastro-esophageal reflux disease without esophagitis: Secondary | ICD-10-CM | POA: Diagnosis present

## 2015-02-24 DIAGNOSIS — R079 Chest pain, unspecified: Secondary | ICD-10-CM | POA: Diagnosis present

## 2015-02-24 DIAGNOSIS — E785 Hyperlipidemia, unspecified: Secondary | ICD-10-CM | POA: Diagnosis present

## 2015-02-24 DIAGNOSIS — Z6841 Body Mass Index (BMI) 40.0 and over, adult: Secondary | ICD-10-CM | POA: Diagnosis not present

## 2015-02-24 DIAGNOSIS — L97529 Non-pressure chronic ulcer of other part of left foot with unspecified severity: Secondary | ICD-10-CM | POA: Diagnosis present

## 2015-02-24 DIAGNOSIS — I5033 Acute on chronic diastolic (congestive) heart failure: Secondary | ICD-10-CM | POA: Diagnosis present

## 2015-02-24 DIAGNOSIS — M170 Bilateral primary osteoarthritis of knee: Secondary | ICD-10-CM | POA: Diagnosis present

## 2015-02-24 DIAGNOSIS — L97319 Non-pressure chronic ulcer of right ankle with unspecified severity: Secondary | ICD-10-CM | POA: Diagnosis present

## 2015-02-24 DIAGNOSIS — L97229 Non-pressure chronic ulcer of left calf with unspecified severity: Secondary | ICD-10-CM | POA: Diagnosis present

## 2015-02-24 DIAGNOSIS — I130 Hypertensive heart and chronic kidney disease with heart failure and stage 1 through stage 4 chronic kidney disease, or unspecified chronic kidney disease: Secondary | ICD-10-CM | POA: Diagnosis present

## 2015-02-24 DIAGNOSIS — E11622 Type 2 diabetes mellitus with other skin ulcer: Secondary | ICD-10-CM | POA: Diagnosis present

## 2015-02-24 DIAGNOSIS — E1122 Type 2 diabetes mellitus with diabetic chronic kidney disease: Secondary | ICD-10-CM | POA: Diagnosis present

## 2015-02-24 LAB — CBC
HCT: 37.2 % — ABNORMAL LOW (ref 39.0–52.0)
HEMOGLOBIN: 12 g/dL — AB (ref 13.0–17.0)
MCH: 27.3 pg (ref 26.0–34.0)
MCHC: 32.3 g/dL (ref 30.0–36.0)
MCV: 84.5 fL (ref 78.0–100.0)
PLATELETS: 300 10*3/uL (ref 150–400)
RBC: 4.4 MIL/uL (ref 4.22–5.81)
RDW: 14.5 % (ref 11.5–15.5)
WBC: 12.3 10*3/uL — AB (ref 4.0–10.5)

## 2015-02-24 LAB — LIPID PANEL
CHOLESTEROL: 214 mg/dL — AB (ref 0–200)
HDL: 28 mg/dL — ABNORMAL LOW (ref >40)
LDL Cholesterol: 167 mg/dL — ABNORMAL HIGH (ref 0–99)
TRIGLYCERIDES: 97 mg/dL (ref <150)
Total CHOL/HDL Ratio: 7.6 RATIO
VLDL: 19 mg/dL (ref 0–40)

## 2015-02-24 LAB — COMPREHENSIVE METABOLIC PANEL
ALBUMIN: 2.4 g/dL — AB (ref 3.5–5.0)
ALK PHOS: 128 U/L — AB (ref 38–126)
ALT: 30 U/L (ref 17–63)
ANION GAP: 11 (ref 5–15)
AST: 34 U/L (ref 15–41)
BILIRUBIN TOTAL: 0.8 mg/dL (ref 0.3–1.2)
BUN: 19 mg/dL (ref 6–20)
CALCIUM: 9 mg/dL (ref 8.9–10.3)
CO2: 32 mmol/L (ref 22–32)
CREATININE: 1.15 mg/dL (ref 0.61–1.24)
Chloride: 89 mmol/L — ABNORMAL LOW (ref 101–111)
GFR calc non Af Amer: >60 mL/min (ref >60)
GLUCOSE: 192 mg/dL — AB (ref 65–99)
Potassium: 3.2 mmol/L — ABNORMAL LOW (ref 3.5–5.1)
SODIUM: 132 mmol/L — AB (ref 135–145)
TOTAL PROTEIN: 6.6 g/dL (ref 6.5–8.1)

## 2015-02-24 LAB — TROPONIN I
TROPONIN I: 0.52 ng/mL — AB (ref <0.031)
TROPONIN I: 1.93 ng/mL — AB (ref <0.031)
Troponin I: 1.67 ng/mL (ref <0.031)
Troponin I: 1.26 ng/mL (ref <0.031)

## 2015-02-24 LAB — PROTIME-INR
INR: 1.38 (ref 0.00–1.49)
Prothrombin Time: 17.1 s — ABNORMAL HIGH (ref 11.6–15.2)

## 2015-02-24 LAB — HEPARIN LEVEL (UNFRACTIONATED)
Heparin Unfractionated: 0.1 IU/mL — ABNORMAL LOW (ref 0.30–0.70)
Heparin Unfractionated: <0.1 IU/mL — ABNORMAL LOW (ref 0.30–0.70)

## 2015-02-24 LAB — GLUCOSE, CAPILLARY
Glucose-Capillary: 173 mg/dL — ABNORMAL HIGH (ref 65–99)
Glucose-Capillary: 159 mg/dL — ABNORMAL HIGH (ref 65–99)
Glucose-Capillary: 210 mg/dL — ABNORMAL HIGH (ref 65–99)
Glucose-Capillary: 249 mg/dL — ABNORMAL HIGH (ref 65–99)

## 2015-02-24 LAB — TSH: TSH: 2.127 u[IU]/mL (ref 0.350–4.500)

## 2015-02-24 MED ORDER — METOLAZONE 5 MG PO TABS
5.0000 mg | ORAL_TABLET | Freq: Every day | ORAL | Status: DC
  Administered 2015-02-24 – 2015-02-25 (×2): 5 mg via ORAL
  Filled 2015-02-24 (×2): qty 1

## 2015-02-24 MED ORDER — HEPARIN BOLUS VIA INFUSION
3000.0000 [IU] | Freq: Once | INTRAVENOUS | Status: AC
  Administered 2015-02-24: 3000 [IU] via INTRAVENOUS
  Filled 2015-02-24: qty 3000

## 2015-02-24 MED ORDER — HEPARIN (PORCINE) IN NACL 100-0.45 UNIT/ML-% IJ SOLN
2600.0000 [IU]/h | INTRAMUSCULAR | Status: AC
  Administered 2015-02-24: 1750 [IU]/h via INTRAVENOUS
  Administered 2015-02-25: 2400 [IU]/h via INTRAVENOUS
  Administered 2015-02-25: 2600 [IU]/h via INTRAVENOUS
  Filled 2015-02-24 (×6): qty 250

## 2015-02-24 NOTE — Progress Notes (Signed)
TELEMETRY: Reviewed telemetry pt in NSR with frequent PACs: Filed Vitals:   02/23/15 1745 02/23/15 1900 02/23/15 2000 02/23/15 2305  BP: 116/67  136/61 148/72  Pulse: 87     Temp:   98.6 F (37 C)   TempSrc:   Oral   Resp: 33  21 13  Height:  5\' 9"  (1.753 m) 5\' 9"  (1.753 m)   Weight:  125.4 kg (276 lb 7.3 oz) 125.465 kg (276 lb 9.6 oz)   SpO2: 95%  98%     Intake/Output Summary (Last 24 hours) at 02/24/15 1204 Last data filed at 02/24/15 0100  Gross per 24 hour  Intake      0 ml  Output   1000 ml  Net  -1000 ml   Filed Weights   02/23/15 1900 02/23/15 2000  Weight: 125.4 kg (276 lb 7.3 oz) 125.465 kg (276 lb 9.6 oz)    Subjective Patient reports chest pain last night that he describes as mild and localized. Reports it went away when IV Ntg stopped. No chest pain now. No Dyspnea at rest. Pain in both legs L>R.  Marland Kitchen. antiseptic oral rinse  7 mL Mouth Rinse q12n4p  . aspirin  81 mg Oral Daily  . chlorhexidine  15 mL Mouth Rinse BID  . clopidogrel  75 mg Oral QHS  . colchicine  0.6 mg Oral Daily  . feeding supplement (ENSURE ENLIVE)  237 mL Oral BID BM  . insulin aspart  0-15 Units Subcutaneous TID WC  . insulin aspart  0-5 Units Subcutaneous QHS  . insulin NPH Human  35 Units Subcutaneous Q supper  . isosorbide mononitrate  15 mg Oral TID  . lisinopril  5 mg Oral Daily  . metoprolol  75 mg Oral TID   . heparin 1,750 Units/hr (02/24/15 0845)  . nitroGLYCERIN Stopped (02/24/15 1113)    LABS: Basic Metabolic Panel:  Recent Labs  96/29/5210/21/16 1220 02/24/15 0646  NA 134* 132*  K 3.6 3.2*  CL 97* 89*  CO2 28 32  GLUCOSE 164* 192*  BUN 19 19  CREATININE 1.01 1.15  CALCIUM 8.7* 9.0   Liver Function Tests:  Recent Labs  02/23/15 1220 02/24/15 0646  AST 21 34  ALT 19 30  ALKPHOS 99 128*  BILITOT 1.2 0.8  PROT 6.2* 6.6  ALBUMIN 2.5* 2.4*   No results for input(s): LIPASE, AMYLASE in the last 72 hours. CBC:  Recent Labs  02/23/15 1220 02/24/15 0646  WBC  13.5* 12.3*  NEUTROABS 10.9*  --   HGB 11.5* 12.0*  HCT 35.2* 37.2*  MCV 83.8 84.5  PLT 277 300   Cardiac Enzymes:  Recent Labs  02/23/15 1220  02/23/15 2255 02/24/15 0240 02/24/15 0646  CKTOTAL 181  --   --   --   --   TROPONINI 0.05*  < > 0.10* 0.52* 1.93*  < > = values in this interval not displayed. BNP: No results for input(s): PROBNP in the last 72 hours. D-Dimer: No results for input(s): DDIMER in the last 72 hours. Hemoglobin A1C: No results for input(s): HGBA1C in the last 72 hours. Fasting Lipid Panel:  Recent Labs  02/24/15 0646  CHOL 214*  HDL 28*  LDLCALC 167*  TRIG 97  CHOLHDL 7.6   Thyroid Function Tests:  Recent Labs  02/24/15 0646  TSH 2.127     Radiology/Studies:  Dg Tibia/fibula Left  02/23/2015  CLINICAL DATA:  Lower extremity cellulitis and pain EXAM: LEFT TIBIA AND FIBULA -  2 VIEW COMPARISON:  None. FINDINGS: Degenerative changes are noted in the medial joint space. No acute fracture or dislocation is noted. No joint effusion is seen. On the lateral film distally there is suggestion of an anterior soft tissue ulcer. Clinical correlation is recommended. No other soft tissue abnormality is seen. IMPRESSION: No acute bony abnormality noted. Possible soft tissue ulcer distally. Electronically Signed   By: Alcide Clever M.D.   On: 02/23/2015 13:51   Dg Tibia/fibula Right  02/23/2015  CLINICAL DATA:  Cellulitis EXAM: RIGHT TIBIA AND FIBULA - 2 VIEW COMPARISON:  None. FINDINGS: Severe degenerative change of the knee joint with narrowing of the medial compartment. No acute fracture. No dislocation. Degenerative changes at the ankle. Small bony density projects over the central knee joint. Loose body is not excluded. IMPRESSION: No acute bony pathology.  Chronic changes. Electronically Signed   By: Jolaine Click M.D.   On: 02/23/2015 13:46   Dg Chest Port 1 View  02/24/2015  CLINICAL DATA:  Nonradiating chest pain, shortness of breath EXAM: PORTABLE  CHEST 1 VIEW COMPARISON:  06/25/2011 FINDINGS: Mild left basilar scarring/ atelectasis. No focal consolidation. No pleural effusion or pneumothorax. Cardiomegaly. Postsurgical changes related to prior CABG. Median sternotomy. IMPRESSION: Mild left basilar scarring/atelectasis. No evidence of acute cardiopulmonary disease. Electronically Signed   By: Charline Bills M.D.   On: 02/24/2015 00:18   Dg Knee Complete 4 Views Right  02/23/2015  CLINICAL DATA:  Bilateral cellulitis with redness, swelling and pain. EXAM: RIGHT KNEE - COMPLETE 4+ VIEW COMPARISON:  None. FINDINGS: There are tricompartmental degenerative changes most prominent over the medial compartment. Evidence of a small joint effusion. Mild subcutaneous edema. No air within the soft tissues. No fracture or dislocation. Multiple surgical clips over the medial soft tissues. Calcified atherosclerotic plaque is seen over the femoral, popliteal and lower leg runoff arteries. IMPRESSION: No acute findings. 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 Small joint effusion. Electronically Signed   By: Elberta Fortis M.D.   On: 02/23/2015 13:45    PHYSICAL EXAM General: Obesein no acute distress. Head: Normocephalic, atraumatic, sclera non-icteric, oropharynx is clear Neck: Negative for carotid bruits. JVD not elevated. No adenopathy Lungs: Clear bilaterally to auscultation without wheezes, rales, or rhonchi. Breathing is unlabored. Heart: IRRR S1 S2 without murmurs, rubs, or gallops.  Abdomen: Soft, non-tender, obese, non-distended with normoactive bowel sounds. No hepatomegaly. No rebound/guarding. No obvious abdominal masses. Extremities: No clubbing, Severe diffuse chronic skin thickening and redness with multiple excoriations. Right left ulcer wrapped in dressing. Tender to palpation. Absent radial pulse due to prior harvest of radial graft. Neuro: Alert and oriented X 3. Moves all extremities spontaneously. Psych:   Responds to questions appropriately with a normal affect.  ASSESSMENT AND PLAN: 1. NSTEMI. Patient had chest pain at rest. Significant Troponin elevation. New marked ST-T wave depression in lateral leads. Recommend continued IV heparin. Will DC IV Ntg at patient request since he is now pain free. Continue metoprolol and oral isosorbide. Patient has extensive cardiac history with prior CABG and multiple stent procedures including to grafts. Will plan cardiac cath on Monday with Dr. Herbie Baltimore. Echo pending today. 2. CKD stage 1.  3. Dyslipidemia. 4. Pulmonary HTN 5. Chronic venous stasis dermatitis with ulceration. 6. DM on insulin 7. HTN 8. Freqent PACs noted on monitor without definite Afib. Metoprolol dose increased.  Plan: I spent over one hour with patient and wife. They have multiple concerns about diagnosis and medication changes. I explained diagnosis of  NSTEMI but he is not convinced he has had an MI. Explained rationale for medication changes. Will switch lasix to metolazone since this is what he takes at home. After much discussion he is amenable to wait until Monday and is agreeable to proceed with cardiac cath with Dr. Herbie Baltimore who he trusts.   Present on Admission:  . Pulmonary HTN, with nighttime oxygen . Dyslipidemia, with intol to statins . Essential hypertension . CKD (chronic kidney disease) stage 1, GFR 90 ml/min or greater . CAD: CABG '95 with redo '08- s/p multiple PCIs since  Signed, Peter [place], MDFACC 02/24/2015 12:04 PM

## 2015-02-24 NOTE — Progress Notes (Signed)
  Echocardiogram 2D Echocardiogram has been performed.  Arvil ChacoFoster, Shawanda Sievert 02/24/2015, 10:38 AM

## 2015-02-24 NOTE — Progress Notes (Signed)
Pt is getting more agitated over NPO status and cardiac direction of treatment , still feels that he isnt having cardiac problems, just that his reason for coming to the hospital was for leg pain. Will continue to monitor

## 2015-02-24 NOTE — Progress Notes (Signed)
Nutrition Brief Note  Patient identified on the Malnutrition Screening Tool (MST) Report  Wt Readings from Last 15 Encounters:  02/23/15 276 lb 9.6 oz (125.465 kg)  12/01/14 287 lb 9 oz (130.437 kg)  06/09/14 274 lb 4.8 oz (124.422 kg)  02/28/14 293 lb (132.904 kg)  08/26/13 293 lb 8 oz (133.131 kg)  04/21/13 286 lb 14.4 oz (130.137 kg)  02/11/13 291 lb (131.997 kg)  01/28/13 296 lb 8 oz (134.492 kg)  10/08/12 286 lb 12.8 oz (130.092 kg)  06/26/11 285 lb 15 oz (129.7 kg)    Body mass index is 40.83 kg/(m^2). Patient meets criteria for Morbid Obesity based on current BMI.   Current diet order is Soft Diet. Patient's diet was just advanced and he has not yet had a meal this admission. He reports eating less PTA due to partial dentures not working well; he needs soft foods.  He has been eating slightly less than usual. Per review of weight history, weight loss is minimal. Labs and medications reviewed. High LDL cholesterol and low HDL cholesterol. Patient declines heart healthy nutrition education and handouts at this time.   Will change diet to Dysphagia 3. No nutrition interventions warranted at this time. If nutrition issues arise, please consult RD.   Dorothea Ogleeanne Citlalic Norlander RD, LDN Inpatient Clinical Dietitian Pager: [phone] After Hours Pager: [phone]

## 2015-02-24 NOTE — Progress Notes (Signed)
ANTICOAGULATION CONSULT NOTE - Follow Up Consult  Pharmacy Consult:  Heparin Indication: chest pain/ACS  Allergies  Allergen Reactions  . Ioxaglate Other (See Comments)    "found out in August 2012; I'm allergic to dye used in CT scans; causes lots of edema"  . Ivp Dye [Iodinated Diagnostic Agents] Other (See Comments)    "found out in August 2012; I'm allergic to dye used in CT scans; causes lots of edema"  . Statins Other (See Comments)    Muscle ache; "rhabdomyolysis"  . Ranexa [Ranolazine]     Jittery,as well as constipation and bleeding hemorrhoids.  . Adhesive [Tape] Itching and Rash  . Penicillins Swelling and Rash      . Tears Again [Artificial Tears] Rash and Other (See Comments)    Burning eyes and skin around eyes    Patient Measurements: Height: 5\' 9"  (175.3 cm) Weight: 276 lb 9.6 oz (125.465 kg) IBW/kg (Calculated) : 70.7 Heparin Dosing Weight: 100 kg  Vital Signs:    Labs:  Recent Labs  02/23/15 1220  02/24/15 0646 02/24/15 1347 02/24/15 1831  HGB 11.5*  --  12.0*  --   --   HCT 35.2*  --  37.2*  --   --   PLT 277  --  300  --   --   LABPROT  --   --  17.1*  --   --   INR  --   --  1.38  --   --   HEPARINUNFRC  --   --  0.10*  --  <0.10*  CREATININE 1.01  --  1.15  --   --   CKTOTAL 181  --   --   --   --   TROPONINI 0.05*  < > 1.93* 1.67* 1.26*  < > = values in this interval not displayed.  Estimated Creatinine Clearance: 78.3 mL/min (by C-G formula based on Cr of 1.15).     Assessment: [age] YOM with a significant cardiac history to continue on IV heparin for ACS.  Heparin level is sub-therapeutic; no bleeding reported.  No complications with infusion reported either.   Goal of Therapy:  Heparin level 0.3-0.7 units/ml Monitor platelets by anticoagulation protocol: Yes    Plan:  - Rebolus with 3000 units IV heparin, then - Increase heparin gtt to 2100 units/hr - Check 8 hr HL - Daily HL / CBC  Estella HuskMichelle Shavette Shoaff, Pharm.D., BCPS,  AAHIVP Clinical Pharmacist Phone: [phone] or [phone] 02/24/2015, 7:47 PM

## 2015-02-24 NOTE — Progress Notes (Signed)
Family Medicine Teaching Service Daily Progress Note Intern Pager: [phone]  Patient name: [name] Medical record number: [id number] Date of birth: 31-Mar-1946 Age: 69 y.o. Gender: male  [facility] Consultants: Cardiology Code Status: Full Code    Pt Overview and Major Events to Date:  10/21: Admit for gout of right knee, chest pain 10/21: worsening chest pain, thought to be consistent wit NSTEMI  Assessment and Plan:  Douglas RomeRaymond G Cain Ball is a 69 y.o. male presenting with right knee pain x4-5 days and chest pain . 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 troponins consitinue to rise 0.05.>>0.17>>0.52 >> 1.93. Per cardiology would expect baseline troponin leak in someone with such a significant cardiac history, however new T wave inversions concerning for NSTEMI. - NPO after midnight per cards for potential cath today - Appreciate cards recs: discussed troponins with them this am.  - continue to cycle troponins - f/u TSH, lipid panel,  - continue to follow with cardiology, touch base with Dr harding this AM, his own cardiologist.  - Of note, he is very upset in the room this am. He says that no one should make changes to his medications without consulting Dr. Herbie BaltimoreHarding. Has not refused Nitro or Heparin at this time, but is waiting to discuss with cards this am.   CHF-  Getting Lasix. Diuresed 1L so far. No increasing SOB. Few crackles this am in the bases. No O2 requirement. Weight is actually down 11lbs from baseline in 11/2014.  - lasix 80 PO qD, but given that he does not want to change from metolazone will wait for his discussion with cards, will hold home metolazone given risk factor for development of gout - continue home  Imdur - has home nightly O2 requirement, continue while here.   Knee Pain- Joint aspirates in ED consistent with Gout with monosodium urate crystals present. No fevers, joint erythema or warmth makes septic arthritis unlikely, mild WBC to 13.5. Hx of gout. Uric acid 8.4. Knee aspirate cultures NGTD. Blood Cx NGTD.  - blood cultures were drawn in ED, will follow - Colchicine  - tylenol, dilaudid PRN pain  Venous Stasis- RLE dopplers neg for DVT, final read pending. 12/29/2014 had b/l dopplers which were sig for venous insufficency, neg for DVT. Treated with lasix per cardiology, s/p multiple bypass [facility] - [facility] consult  - continue Lasix as above - Not infected to exam.   HTN- Blood pressure stable  - continue home lopressor, lisinopril  DM Takes NPH 35-40 units qD, with sliding scale, no A1c in our system - will start mod SSI - continue home NPH 35, titrate as needed - ACBG ACHS  HLD- Last lipid panel 2011, cholersterol 252, Trig 270, DLD 170, HDL 28, Given h/o mulitiple coronary events would start statin, but has previously not been tolerated by pt  CKD- Cr 1.01 today, at baseline - follow Cr  FEN/GI: Heart/ healthy /Carb modified diet, SLIV Prophylaxis: Lovenox ppx Disposition: Pending cards evaluation.   Subjective:  Pt. Very upset overnight due to "medication changes overnight". He expresses significant frustration with the care that he has received due to the addition of nitroglycerin drip and heparin drip. He says that he has had  increasing chest pain overnight in the center and left lateral part of his chest. He says that "this is from the nitroglycerin". He has not had any worsening SOB. He uses O2 overnight at baseline. He is not on O2 this am. He denies nausea, or diaphoresis at this time. He feels that his current symptoms are chronic and unrelated to his heart or any ongoing NSTEMI. He would like close coordination with cardiology  and especially Dr. Herbie Baltimore who he trusts.   Objective: Temp:  [97.9 F (36.6 C)-98.6 F (37 C)] 98.6 F (37 C) (10/21 2000) Pulse Rate:  [73-87] 87 (10/21 1745) Resp:  [13-33] 13 (10/21 2305) BP: (105-149)/(32-99) 148/72 mmHg (10/21 2305) SpO2:  [92 %-98 %] 98 % (10/21 2000) Weight:  [276 lb 7.3 oz (125.4 kg)-276 lb 9.6 oz (125.465 kg)] 276 lb 9.6 oz (125.465 kg) (10/21 2000) Physical Exam: General: NAD, AAOx3, he is resting flat in the chair this am.  Cardiovascular: RRR, no new MGR, 2+ distal pulses.  Respiratory: Mild crackles in the bases, Appropriate rate, unlabored.  Abdomen: S, NT,ND, +BS.  Extremities: Stasis dermatitis / chronic changes with punctate ulcerations noted on BL LE. These do not look infected / cellulitic, but are in various stages of healing. These are chronic and unchanged per Pt. He has tenderness especially of his toes on the left. His right knee is s/p drainage. Does not appear erythematous, and is not warm. Some pain, but it is improved.  Skin: as above.  Neuro: No focal deficits at this time.   Laboratory:  Recent Labs Lab 02/23/15 1220 02/24/15 0646  WBC 13.5* 12.3*  HGB 11.5* 12.0*  HCT 35.2* 37.2*  PLT 277 300    Recent Labs Lab 02/23/15 1220 02/24/15 0646  NA 134* 132*  K 3.6 3.2*  CL 97* 89*  CO2 28 32  BUN 19 19  CREATININE 1.01 1.15  CALCIUM 8.7* 9.0  PROT 6.2* 6.6  BILITOT 1.2 0.8  ALKPHOS 99 128*  ALT 19 30  AST 21 34  GLUCOSE 164* 192*   Troponin 0.52 > 1.93  EKG - T wave inversions V2-V6.    Imaging/Diagnostic Tests: CXR Portable-   IMPRESSION: Mild left basilar scarring/atelectasis.  No evidence of acute cardiopulmonary disease.  Yolande Jolly, MD 02/24/2015, 10:45 AM PGY-2, [facility] FPTS Intern pager: [phone], text pages welcome

## 2015-02-24 NOTE — Progress Notes (Signed)
Patient requested that IV nitro be turned off. MD notified.

## 2015-02-24 NOTE — Progress Notes (Signed)
Texted Cards Fellow with Trop 599f 0.52. Pt already on Heparin gtt, previous Trops were +

## 2015-02-24 NOTE — Progress Notes (Signed)
PT Cancellation Note  Patient Details Name: [name] MRN: [id number] DOB: [birth year]   Cancelled Treatment:    Reason Eval/Treat Not Completed: Medical issues which prohibited therapy (Troponin trending up w/ latest 1.93).  PT will continue to follow acutely and will complete evaluation once pt is medically appropriate.  Michail JewelsAshley Parr PT, DPT [phone] Pager: [phone] 02/24/2015, 11:53 AM

## 2015-02-24 NOTE — ED Notes (Cosign Needed)
1:45PM 02/23/2015  Joint aspiration performed by myself. Area was prepped with alcohol pad and iodine swab. 2% lidocaine without epinephrine was injected over the superior lateral aspect of the knee. Approximately 6 mL of lidocaine injected. 18-gauge needle was inserted posterior to the superior aspect of the patella using a lateral approach. Approximately 45 mL of yellow fluid was aspirated without difficulty. Needle withdrawn and Band-Aid applied. Patient tolerated procedure well.  Renne CriglerJoshua Haig Gerardo, PA-C 02/24/15 1603  Renne CriglerJoshua Jaymie Mckiddy, PA-C 02/24/15 [phone]

## 2015-02-24 NOTE — Progress Notes (Signed)
ANTICOAGULATION CONSULT NOTE - Follow Up Consult  Pharmacy Consult:  Heparin Indication: chest pain/ACS  Allergies  Allergen Reactions  . Ioxaglate Other (See Comments)    "found out in August 2012; I'm allergic to dye used in CT scans; causes lots of edema"  . Ivp Dye [Iodinated Diagnostic Agents] Other (See Comments)    "found out in August 2012; I'm allergic to dye used in CT scans; causes lots of edema"  . Statins Other (See Comments)    Muscle ache; "rhabdomyolysis"  . Ranexa [Ranolazine]     Jittery,as well as constipation and bleeding hemorrhoids.  . Adhesive [Tape] Itching and Rash  . Penicillins Swelling and Rash    Has patient had a PCN reaction causing immediate rash, facial/tongue/throat swelling, SOB or lightheadedness with hypotension: Yes Has patient had a PCN reaction causing severe rash involving mucus membranes or skin necrosis: No Has patient had a PCN reaction that required hospitalization No Has patient had a PCN reaction occurring within the last 10 years: No If all of the above answers are "NO", then may proceed with Cephalosporin use.  . Tears Again [Artificial Tears] Rash and Other (See Comments)    Burning eyes and skin around eyes    Patient Measurements: Height: 5\' 9"  (175.3 cm) Weight: 276 lb 9.6 oz (125.465 kg) IBW/kg (Calculated) : 70.7 Heparin Dosing Weight: 100 kg  Vital Signs: BP: 148/72 mmHg (10/21 2305)  Labs:  Recent Labs  02/23/15 1220  02/23/15 2255 02/24/15 0240 02/24/15 0646  HGB 11.5*  --   --   --  12.0*  HCT 35.2*  --   --   --  37.2*  PLT 277  --   --   --  300  LABPROT  --   --   --   --  17.1*  INR  --   --   --   --  1.38  HEPARINUNFRC  --   --   --   --  0.10*  CREATININE 1.01  --   --   --  1.15  CKTOTAL 181  --   --   --   --   TROPONINI 0.05*  < > 0.10* 0.52* 1.93*  < > = values in this interval not displayed.  Estimated Creatinine Clearance: 78.3 mL/min (by C-G formula based on Cr of  1.15).     Assessment: [age] YOM with a significant cardiac history to continue on IV heparin for ACS.  Heparin level is sub-therapeutic; no bleeding reported.  No complications with infusion reported either.   Goal of Therapy:  Heparin level 0.3-0.7 units/ml Monitor platelets by anticoagulation protocol: Yes    Plan:  - Rebolus with 3000 units IV heparin, then - Increase heparin gtt to 1750 units/hr - Check 8 hr HL - Daily HL / CBC - Consider holding Imdur while on NTG gtt - F/U KCL supplementation   Tamber Burtch D. Laney Potashang, PharmD, BCPS Pager:  [phone] 02/24/2015, 8:37 AM

## 2015-02-24 NOTE — Progress Notes (Signed)
Pt's wife called. The pt and his wife get very upset when any medication changes are suggested. They do not want any changes made unless Dr Herbie BaltimoreHarding is involved.  Corine ShelterLUKE Daylah Sayavong PA-C 02/24/2015 7:53 AM

## 2015-02-24 NOTE — Progress Notes (Signed)
Started Heparin gtt, Nitro gtt up to 10mcg and pt reports pain at 2/10. Very vociferous about pain in back from uncomortable bed, said couldn't sleep because of bed hurting, placed in chair, at pts request, with much assistance since right leg is NWB. Positioned urinals, call light and table in reach. Pts legs are extremely sensitive to movement and touch. Keeps repeating "I don't see why all the fuss is about my heart, I came in for leg pain." Placed on 3L O2 as pt says he uses at home PRN. Will continue to monitor

## 2015-02-25 DIAGNOSIS — R079 Chest pain, unspecified: Secondary | ICD-10-CM

## 2015-02-25 DIAGNOSIS — L8990 Pressure ulcer of unspecified site, unspecified stage: Secondary | ICD-10-CM | POA: Insufficient documentation

## 2015-02-25 LAB — BASIC METABOLIC PANEL
Anion gap: 11 (ref 5–15)
BUN: 33 mg/dL — AB (ref 6–20)
CHLORIDE: 94 mmol/L — AB (ref 101–111)
CO2: 29 mmol/L (ref 22–32)
Calcium: 8.8 mg/dL — ABNORMAL LOW (ref 8.9–10.3)
Creatinine, Ser: 1.3 mg/dL — ABNORMAL HIGH (ref 0.61–1.24)
GFR calc Af Amer: >60 mL/min (ref >60)
GFR, EST NON AFRICAN AMERICAN: 54 mL/min — AB (ref >60)
GLUCOSE: 186 mg/dL — AB (ref 65–99)
POTASSIUM: 3.3 mmol/L — AB (ref 3.5–5.1)
Sodium: 134 mmol/L — ABNORMAL LOW (ref 135–145)

## 2015-02-25 LAB — GLUCOSE, CAPILLARY
GLUCOSE-CAPILLARY: 175 mg/dL — AB (ref 65–99)
GLUCOSE-CAPILLARY: 313 mg/dL — AB (ref 65–99)
Glucose-Capillary: 221 mg/dL — ABNORMAL HIGH (ref 65–99)
Glucose-Capillary: 251 mg/dL — ABNORMAL HIGH (ref 65–99)

## 2015-02-25 LAB — CBC
HEMATOCRIT: 36.4 % — AB (ref 39.0–52.0)
Hemoglobin: 11.7 g/dL — ABNORMAL LOW (ref 13.0–17.0)
MCH: 26.9 pg (ref 26.0–34.0)
MCHC: 32.1 g/dL (ref 30.0–36.0)
MCV: 83.7 fL (ref 78.0–100.0)
PLATELETS: 318 10*3/uL (ref 150–400)
RBC: 4.35 MIL/uL (ref 4.22–5.81)
RDW: 14.6 % (ref 11.5–15.5)
WBC: 10.8 10*3/uL — ABNORMAL HIGH (ref 4.0–10.5)

## 2015-02-25 LAB — TROPONIN I
Troponin I: 0.78 ng/mL (ref <0.031)
Troponin I: 0.51 ng/mL (ref <0.031)
Troponin I: 0.41 ng/mL — ABNORMAL HIGH (ref <0.031)

## 2015-02-25 LAB — HEPARIN LEVEL (UNFRACTIONATED)
HEPARIN UNFRACTIONATED: 0.26 [IU]/mL — AB (ref 0.30–0.70)
Heparin Unfractionated: 0.18 IU/mL — ABNORMAL LOW (ref 0.30–0.70)

## 2015-02-25 MED ORDER — SODIUM CHLORIDE 0.9 % IV SOLN: 250.0000 mL | INTRAVENOUS | Status: DC | PRN

## 2015-02-25 MED ORDER — SODIUM CHLORIDE 0.9 % IV SOLN: INTRAVENOUS | Status: DC

## 2015-02-25 MED ORDER — SODIUM CHLORIDE 0.9 % IJ SOLN: 3.0000 mL | INTRAMUSCULAR | Status: DC | PRN

## 2015-02-25 MED ORDER — HEPARIN BOLUS VIA INFUSION
3000.0000 [IU] | Freq: Once | INTRAVENOUS | Status: AC
Start: 2015-02-25 — End: 2015-02-25
  Administered 2015-02-25: 3000 [IU] via INTRAVENOUS
  Filled 2015-02-25: qty 3000

## 2015-02-25 MED ORDER — POTASSIUM CHLORIDE CRYS ER 20 MEQ PO TBCR
40.0000 meq | EXTENDED_RELEASE_TABLET | Freq: Once | ORAL | Status: AC
  Administered 2015-02-25: 40 meq via ORAL
  Filled 2015-02-25: qty 2

## 2015-02-25 MED ORDER — SODIUM CHLORIDE 0.9 % IV SOLN
INTRAVENOUS | Status: DC
  Administered 2015-02-26: 01:00:00 via INTRAVENOUS

## 2015-02-25 MED ORDER — PREDNISONE 20 MG PO TABS
60.0000 mg | ORAL_TABLET | Freq: Once | ORAL | Status: DC
  Filled 2015-02-25: qty 3

## 2015-02-25 MED ORDER — METOPROLOL TARTRATE 50 MG PO TABS
50.0000 mg | ORAL_TABLET | Freq: Three times a day (TID) | ORAL | Status: DC
  Administered 2015-02-25 – 2015-02-28 (×10): 50 mg via ORAL
  Filled 2015-02-25 (×9): qty 1

## 2015-02-25 MED ORDER — PREDNISONE 20 MG PO TABS
60.0000 mg | ORAL_TABLET | ORAL | Status: AC
Start: 2015-02-26 — End: 2015-02-27

## 2015-02-25 MED ORDER — SODIUM CHLORIDE 0.9 % IJ SOLN
3.0000 mL | Freq: Two times a day (BID) | INTRAMUSCULAR | Status: DC
  Administered 2015-02-25 – 2015-02-27 (×4): 3 mL via INTRAVENOUS

## 2015-02-25 MED ORDER — PREDNISONE 20 MG PO TABS
60.0000 mg | ORAL_TABLET | Freq: Every day | ORAL | Status: DC
  Filled 2015-02-25: qty 3

## 2015-02-25 MED ORDER — FAMOTIDINE 20 MG PO TABS: 20.0000 mg | ORAL_TABLET | ORAL | Status: AC

## 2015-02-25 NOTE — Progress Notes (Signed)
ANTICOAGULATION CONSULT NOTE - Follow Up Consult  Pharmacy Consult for heparin Indication: chest pain/ACS    Patient Measurements: Height: 5\' 9"  (175.3 cm) Weight:  (patient cannot stand to get weight) IBW/kg (Calculated) : 70.7 Heparin Dosing Weight: 100 kg  Vital Signs: Temp: 98.3 F (36.8 C) (10/23 0512) Temp Source: Oral (10/23 0512) BP: 115/62 mmHg (10/23 1102) Pulse Rate: 71 (10/23 1102)  Labs:  Recent Labs  02/23/15 1220  02/24/15 0646  02/24/15 1831 02/25/15 0023 02/25/15 0401 02/25/15 0636 02/25/15 1130 02/25/15 1610  HGB 11.5*  --  12.0*  --   --   --  11.7*  --   --   --   HCT 35.2*  --  37.2*  --   --   --  36.4*  --   --   --   PLT 277  --  300  --   --   --  318  --   --   --   LABPROT  --   --  17.1*  --   --   --   --   --   --   --   INR  --   --  1.38  --   --   --   --   --   --   --   HEPARINUNFRC  --   < > 0.10*  --  <0.10*  --  0.18*  --   --  0.26*  CREATININE 1.01  --  1.15  --   --   --  1.30*  --   --   --   CKTOTAL 181  --   --   --   --   --   --   --   --   --   TROPONINI 0.05*  < > 1.93*  < > 1.26* 0.78*  --  0.51* 0.41*  --   < > = values in this interval not displayed.  Estimated Creatinine Clearance: 69.3 mL/min (by C-G formula based on Cr of 1.3).   Medications:  Scheduled:  . antiseptic oral rinse  7 mL Mouth Rinse q12n4p  . aspirin  81 mg Oral Daily  . chlorhexidine  15 mL Mouth Rinse BID  . clopidogrel  75 mg Oral QHS  . colchicine  0.6 mg Oral Daily  . [START ON 02/26/2015] famotidine  20 mg Oral Pre-Cath  . insulin aspart  0-15 Units Subcutaneous TID WC  . insulin aspart  0-5 Units Subcutaneous QHS  . insulin NPH Human  35 Units Subcutaneous Q supper  . isosorbide mononitrate  15 mg Oral TID  . metoprolol  50 mg Oral TID  . predniSONE  60 mg Oral Once   Followed by  . [START ON 02/26/2015] predniSONE  60 mg Oral Pre-Cath  . sodium chloride  3 mL Intravenous Q12H   Infusions:  . [START ON 02/26/2015] sodium  chloride    . sodium chloride    . heparin 2,400 Units/hr (02/25/15 1109)    Assessment: 69 yo male with ACS is currently on subtherapeutic heparin.  Heparin level is 0.26.   Goal of Therapy:  Heparin level 0.3-0.7 units/ml Monitor platelets by anticoagulation protocol: Yes   Plan:  - increase heparin to 2600 units/hr - 8 hr heparin level  Euel Castile, Tsz-Yin 02/25/2015,5:10 PM

## 2015-02-25 NOTE — Progress Notes (Signed)
Family Medicine Teaching Service Daily Progress Note Intern Pager: [phone]  Patient name: [name] Medical record number: [id number] Date of birth: 11-Apr-1946 Age: 69 y.o. Gender: male  Primary Care Provider: [facility] Consultants: Cardiology Code Status: Full Code    Pt Overview and Major Events to Date:  10/21: Admit for gout of right knee, chest pain 10/21: worsening chest pain, thought to be consistent wit NSTEMI  Assessment and Plan:  [name] is a 69 y.o. male presenting with right knee pain x4-5 days and chest pain . 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  EKG - T wave inversions V2-V6. Echo: EF 60-65%. Troponin peaked at 1.93 yesterday morning, now downtrending: 1.26>>0.78>>0.51. TSH normal,  Nitro drip stopped at patient request as it "makes chest pain worse". Denies chest pain or shortness of breath.  - NPO after midnight per cards for potential cath tomorrow by Dr Herbie Baltimore - Continue to follow with cardiology, appreciate their assistance  - continue to cycle troponins - Continue Heparin drip   CHF-   Metolazone at home. Diuresed 830 mL over night No increasing SOB, CTAB. No O2 requirement.  - hold home metolazone per cardiology for procedure tomorrow - continue home Imdur - has home nightly O2 requirement, continue while here.   Knee Pain- Joint aspirates in ED consistent with Gout with monosodium urate crystals present. No fevers, joint erythema or warmth makes septic arthritis unlikely, mild WBC to 10.8. Hx of gout. Uric acid 8.4. Knee aspirate cultures NGTD. Blood Cx NGTD.  - Stop Colchicine, start Prednisone 60 mg x 5 days - tylenol, dilaudid PRN pain - f/u PT  consult  Venous Stasis- RLE dopplers neg for DVT, final read pending. 12/29/2014 had b/l dopplers which were sig for venous insufficency, neg for DVT. s/p multiple [facility]. No signs of infection. - wound care consult   HTN- Blood pressure stable  - continue home lopressor - Hold Lisinopril for increased Cr  DM Takes NPH 35-40 units qD, with sliding scale, no A1c in our system -  mod SSI - continue home NPH 35, titrate as needed - ACBG ACHS  HLD- 10/22 Lipid Panel; Cholesterol 214, LDL 167, HDL 28. Given h/o mulitiple coronary events would start statin, but has previously not been tolerated by pt  CKD- Cr 1.30 today. Baseline near 1. - Continue to follow Cr - Hold Metolazone and Lisinopril   FEN/GI: Heart/ healthy /Carb modified diet, SLIV Prophylaxis: Lovenox ppx Disposition: Pending cards evaluation.   Subjective:  Patient is very talkative this morning and is adamant that nitro makes his chest pain worse and insulin makes his blood glucose higher. He denies any chest pain or shortness of breath this morning. He has been urinating well .Patient states he has chronic back pain which makes it hard for him to sleep. Patient also does not believe he had a heart attack.   Objective: Temp:  [97.7 F (36.5 C)-98.3 F (36.8 C)] 98.3 F (36.8 C) (10/23 0512) Pulse Rate:  [63-71] 63 (10/23 0512) Resp:  [20] 20 (10/23 0512) BP: (106-123)/(46-47) 106/47 mmHg (10/23 0512) SpO2:  [94 %-95 %] 95 % (10/23 0512) Physical Exam: General: NAD, AAOx3,  he is resting flat in the chair this am.  Cardiovascular: RRR, no new MGR, dorsalis pedis pulses faint.  Respiratory: Mild crackles in the bases, Appropriate rate, unlabored.  Abdomen: S, NT,ND, +BS.  Extremities: Bandages wrapped around lower extremeties. Stasis dermatitis / chronic changes with ulcerations noted on BL LE.  No erythema, no warmth. Mild tenderness to palpation throughout.  Skin: as  described above. See picture below Neuro: No focal deficits at this time.       Laboratory:  Recent Labs Lab 02/23/15 1220 02/24/15 0646 02/25/15 0401  WBC 13.5* 12.3* 10.8*  HGB 11.5* 12.0* 11.7*  HCT 35.2* 37.2* 36.4*  PLT 277 300 318    Recent Labs Lab 02/23/15 1220 02/24/15 0646 02/25/15 0401  NA 134* 132* 134*  K 3.6 3.2* 3.3*  CL 97* 89* 94*  CO2 28 32 29  BUN 19 19 33*  CREATININE 1.01 1.15 1.30*  CALCIUM 8.7* 9.0 8.8*  PROT 6.2* 6.6  --   BILITOT 1.2 0.8  --   ALKPHOS 99 128*  --   ALT 19 30  --   AST 21 34  --   GLUCOSE 164* 192* 186*    Intake/Output Summary (Last 24 hours) at 02/25/15 1528 Last data filed at 02/25/15 1200  Gross per 24 hour  Intake 445.98 ml  Output   1230 ml  Net -784.02 ml    EKG - T wave inversions V2-V6.   Imaging/Diagnostic Tests: CXR Portable-   IMPRESSION: Mild left basilar scarring/atelectasis.  No evidence of acute cardiopulmonary disease.  Beaulah Dinninghristina M Hever Castilleja, MD 02/25/2015, 7:24 AM PGY-1, [facility] FPTS Intern pager: [phone], text pages welcome

## 2015-02-25 NOTE — Progress Notes (Addendum)
TELEMETRY: Reviewed telemetry pt in NSR with frequent PACs: Filed Vitals:   02/23/15 2305 02/24/15 2032 02/25/15 0512 02/25/15 1102  BP: 148/72 123/46 106/47 115/62  Pulse:  71 63 71  Temp:  97.7 F (36.5 C) 98.3 F (36.8 C)   TempSrc:  Oral Oral   Resp: 13  20   Height:      Weight:      SpO2:  94% 95%     Intake/Output Summary (Last 24 hours) at 02/25/15 1215 Last data filed at 02/25/15 0933  Gross per 24 hour  Intake 445.98 ml  Output   1030 ml  Net -584.02 ml   Filed Weights   02/23/15 1900 02/23/15 2000  Weight: 125.4 kg (276 lb 7.3 oz) 125.465 kg (276 lb 9.6 oz)    Subjective Patient feels well. No chest pain or SOB.   Marland Kitchen antiseptic oral rinse  7 mL Mouth Rinse q12n4p  . aspirin  81 mg Oral Daily  . chlorhexidine  15 mL Mouth Rinse BID  . clopidogrel  75 mg Oral QHS  . colchicine  0.6 mg Oral Daily  . insulin aspart  0-15 Units Subcutaneous TID WC  . insulin aspart  0-5 Units Subcutaneous QHS  . insulin NPH Human  35 Units Subcutaneous Q supper  . isosorbide mononitrate  15 mg Oral TID  . lisinopril  5 mg Oral Daily  . metolazone  5 mg Oral Daily  . metoprolol  75 mg Oral TID   . heparin 2,400 Units/hr (02/25/15 1109)    LABS: Basic Metabolic Panel:  Recent Labs  09/81/19 0646 02/25/15 0401  NA 132* 134*  K 3.2* 3.3*  CL 89* 94*  CO2 32 29  GLUCOSE 192* 186*  BUN 19 33*  CREATININE 1.15 1.30*  CALCIUM 9.0 8.8*   Liver Function Tests:  Recent Labs  02/23/15 1220 02/24/15 0646  AST 21 34  ALT 19 30  ALKPHOS 99 128*  BILITOT 1.2 0.8  PROT 6.2* 6.6  ALBUMIN 2.5* 2.4*   No results for input(s): LIPASE, AMYLASE in the last 72 hours. CBC:  Recent Labs  02/23/15 1220 02/24/15 0646 02/25/15 0401  WBC 13.5* 12.3* 10.8*  NEUTROABS 10.9*  --   --   HGB 11.5* 12.0* 11.7*  HCT 35.2* 37.2* 36.4*  MCV 83.8 84.5 83.7  PLT 277 300 318   Cardiac Enzymes:  Recent Labs  02/23/15 1220  02/24/15 1831 02/25/15 0023 02/25/15 0636    CKTOTAL 181  --   --   --   --   TROPONINI 0.05*  < > 1.26* 0.78* 0.51*  < > = values in this interval not displayed. BNP: No results for input(s): PROBNP in the last 72 hours. D-Dimer: No results for input(s): DDIMER in the last 72 hours. Hemoglobin A1C: No results for input(s): HGBA1C in the last 72 hours. Fasting Lipid Panel:  Recent Labs  02/24/15 0646  CHOL 214*  HDL 28*  LDLCALC 167*  TRIG 97  CHOLHDL 7.6   Thyroid Function Tests:  Recent Labs  02/24/15 0646  TSH 2.127     Radiology/Studies:  Dg Tibia/fibula Left  02/23/2015  CLINICAL DATA:  Lower extremity cellulitis and pain EXAM: LEFT TIBIA AND FIBULA - 2 VIEW COMPARISON:  None. FINDINGS: Degenerative changes are noted in the medial joint space. No acute fracture or dislocation is noted. No joint effusion is seen. On the lateral film distally there is suggestion of an anterior soft tissue ulcer. Clinical correlation  is recommended. No other soft tissue abnormality is seen. IMPRESSION: No acute bony abnormality noted. Possible soft tissue ulcer distally. Electronically Signed   By: Alcide CleverMark  Lukens M.D.   On: 02/23/2015 13:51   Dg Tibia/fibula Right  02/23/2015  CLINICAL DATA:  Cellulitis EXAM: RIGHT TIBIA AND FIBULA - 2 VIEW COMPARISON:  None. FINDINGS: Severe degenerative change of the knee joint with narrowing of the medial compartment. No acute fracture. No dislocation. Degenerative changes at the ankle. Small bony density projects over the central knee joint. Loose body is not excluded. IMPRESSION: No acute bony pathology.  Chronic changes. Electronically Signed   By: Jolaine ClickArthur  Hoss M.D.   On: 02/23/2015 13:46   Dg Chest Port 1 View  02/24/2015  CLINICAL DATA:  Nonradiating chest pain, shortness of breath EXAM: PORTABLE CHEST 1 VIEW COMPARISON:  06/25/2011 FINDINGS: Mild left basilar scarring/ atelectasis. No focal consolidation. No pleural effusion or pneumothorax. Cardiomegaly. Postsurgical changes related to prior  CABG. Median sternotomy. IMPRESSION: Mild left basilar scarring/atelectasis. No evidence of acute cardiopulmonary disease. Electronically Signed   By: Charline BillsSriyesh  Krishnan M.D.   On: 02/24/2015 00:18   Dg Knee Complete 4 Views Right  02/23/2015  CLINICAL DATA:  Bilateral cellulitis with redness, swelling and pain. EXAM: RIGHT KNEE - COMPLETE 4+ VIEW COMPARISON:  None. FINDINGS: There are tricompartmental degenerative changes most prominent over the medial compartment. Evidence of a small joint effusion. Mild subcutaneous edema. No air within the soft tissues. No fracture or dislocation. Multiple surgical clips over the medial soft tissues. Calcified atherosclerotic plaque is seen over the femoral, popliteal and lower leg runoff arteries. IMPRESSION: No acute findings. 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 Small joint effusion. Electronically Signed   By: Elberta Fortisaniel  Boyle M.D.   On: 02/23/2015 13:45    PHYSICAL EXAM General: Obesein no acute distress. Head: Normocephalic, atraumatic, sclera non-icteric, oropharynx is clear Neck: Negative for carotid bruits. JVD not elevated. No adenopathy Lungs: Clear bilaterally to auscultation without wheezes, rales, or rhonchi. Breathing is unlabored. Heart: IRRR S1 S2 without murmurs, rubs, or gallops.  Abdomen: Soft, non-tender, obese, non-distended with normoactive bowel sounds. No hepatomegaly. No rebound/guarding. No obvious abdominal masses. Extremities: No clubbing, Severe diffuse chronic skin thickening and redness with multiple excoriations. Right left ulcer wrapped in dressing. Tender to palpation. Absent radial pulse due to prior harvest of radial graft. Neuro: Alert and oriented X 3. Moves all extremities spontaneously. Psych:  Responds to questions appropriately with a normal affect.  ASSESSMENT AND PLAN: 1. NSTEMI. Patient had chest pain at rest. Significant Troponin elevation. New marked ST-T wave depression in lateral  leads. Recommend continued IV heparin. Will DC IV Ntg at patient request since he is now pain free. Continue metoprolol and oral isosorbide. Patient has extensive cardiac history with prior CABG and multiple stent procedures including to grafts. Will plan cardiac cath on Monday with Dr. Herbie BaltimoreHarding. Echo shows good LV function 2. CKD stage 1. Creatinine increased to 1.3. Will hold metolazone and lisinopril and hydrate overnight for cath. 3. Dyslipidemia. 4. Pulmonary HTN 5. Chronic venous stasis dermatitis with ulceration. 6. DM on insulin 7. HTN 8. Freqent PACs noted on monitor without definite Afib. Metoprolol dose increased.  Present on Admission:  . Pulmonary HTN, with nighttime oxygen . Dyslipidemia, with intol to statins . Essential hypertension . CKD (chronic kidney disease) stage 1, GFR 90 ml/min or greater . 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

## 2015-02-25 NOTE — Progress Notes (Signed)
Physical Therapy Treatment Patient Details Name: [name] MRN: [id number] DOB: June 27, 1945 Today's Date: 02/25/2015    History of Present Illness Celvin Taney Ball Ball is a 69 y.o. male presenting with right knee pain x4-5 days and chest pain . 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  Further cardiac work up continues.    PT Comments    Pt admitted with/for R LE pain and CP; cardiac markers show NSTEMI.  Pt has been mostly immobile since in hospital.  Pt currently limited functionally due to the problems listed. ( See problems list.)   Pt will benefit from PT to maximize function and safety in order to get ready for next venue listed below.   Follow Up Recommendations  SNF     Equipment Recommendations  None recommended by PT    Recommendations for Other Services       Precautions / Restrictions Precautions Precautions: Fall    Mobility  Bed Mobility               General bed mobility comments: pt wil not or can not layin the bed  Transfers Overall transfer level: Needs assistance Equipment used: Rolling walker (2 wheeled) Transfers: Sit to/from Stand Sit to Stand: Max assist;+2 physical assistance         General transfer comment: face to face and 1 person to side assist for standing then RW placed in front for standing while linens changed.  Ambulation/Gait             General Gait Details: pt unable   Stairs            Wheelchair Mobility    Modified Rankin (Stroke Patients Only)       Balance Overall balance assessment: Needs assistance Sitting-balance support: No upper extremity supported Sitting balance-Leahy Scale: Fair     Standing balance support: Bilateral upper extremity supported Standing balance-Leahy Scale: Poor Standing balance comment: reliant on RW  or assist.  Stood x1 min while chair linens were changed, pt would not allow changing of underwear or pericare while up.                    Cognition Arousal/Alertness: Awake/alert Behavior During Therapy:  (abusive) Overall Cognitive Status: Within Functional Limits for tasks assessed                      Exercises      General Comments        Pertinent Vitals/Pain Pain Assessment: 0-10 Pain Score: 10-Worst pain ever Pain Descriptors / Indicators: Sharp Pain Intervention(s): Limited activity within patient's tolerance    Home Living Family/patient expects to be discharged to:: Private residence Living Arrangements: Spouse/significant other Available Help at Discharge: Available PRN/intermittently Type of Home: House Home Access: Stairs to enter   Home Layout: One level Home Equipment: Environmental consultant - 2 wheels;Crutches;Cane - single point      Prior Function Level of Independence: Independent;Independent with assistive device(s)          PT Goals (current goals can now be found in the care plan section) Acute Rehab PT Goals Patient Stated Goal: be able to get up and move PT Goal Formulation: With patient Time For Goal Achievement: 03/09/15 Potential to Achieve Goals: Good    Frequency  Min 3X/week  PT Plan      Co-evaluation             End of Session   Activity Tolerance: Patient limited by pain Patient left: in chair;with call bell/phone within reach;with family/visitor present     Time: [id number] PT Time Calculation (min) (ACUTE ONLY): 31 min  Charges:  $Therapeutic Activity: 8-22 mins                    G Codes:      Taishaun Levels, Eliseo GumKenneth V 02/25/2015, 11:08 AM 02/25/2015  [place] BingKen Ximena Todaro, PT [phone]  (pager)

## 2015-02-25 NOTE — Progress Notes (Signed)
ANTICOAGULATION CONSULT NOTE - Follow Up Consult  Pharmacy Consult:  Heparin Indication: chest pain/ACS  Allergies  Allergen Reactions  . Ioxaglate Other (See Comments)    "found out in August 2012; I'm allergic to dye used in CT scans; causes lots of edema"  . Ivp Dye [Iodinated Diagnostic Agents] Other (See Comments)    "found out in August 2012; I'm allergic to dye used in CT scans; causes lots of edema"  . Statins Other (See Comments)    Muscle ache; "rhabdomyolysis"  . Ranexa [Ranolazine]     Jittery,as well as constipation and bleeding hemorrhoids.  . Adhesive [Tape] Itching and Rash  . Penicillins Swelling and Rash      . Tears Again [Artificial Tears] Rash and Other (See Comments)    Burning eyes and skin around eyes    Patient Measurements: Height: 5\' 9"  (175.3 cm) Weight: 276 lb 9.6 oz (125.465 kg) IBW/kg (Calculated) : 70.7 Heparin Dosing Weight: 100 kg  Vital Signs: Temp: 98.3 F (36.8 C) (10/23 0512) Temp Source: Oral (10/23 0512) BP: 106/47 mmHg (10/23 0512) Pulse Rate: 63 (10/23 0512)  Labs:  Recent Labs  02/23/15 1220  02/24/15 0646 02/24/15 1347 02/24/15 1831 02/25/15 0023 02/25/15 0401  HGB 11.5*  --  12.0*  --   --   --  11.7*  HCT 35.2*  --  37.2*  --   --   --  36.4*  PLT 277  --  300  --   --   --  318  LABPROT  --   --  17.1*  --   --   --   --   INR  --   --  1.38  --   --   --   --   HEPARINUNFRC  --   --  0.10*  --  <0.10*  --  0.18*  CREATININE 1.01  --  1.15  --   --   --   --   CKTOTAL 181  --   --   --   --   --   --   TROPONINI 0.05*  < > 1.93* 1.67* 1.26* 0.78*  --   < > = values in this interval not displayed.  Estimated Creatinine Clearance: 78.3 mL/min (by C-G formula based on Cr of 1.15).   Assessment: [age] YOM with a significant cardiac history to continue on IV heparin for ACS.  Heparin level remains sub-therapeutic but trending up. CBC stable. No issues with line or bleeding reported per RN.  Goal of Therapy:  Heparin  level 0.3-0.7 units/ml Monitor platelets by anticoagulation protocol: Yes   Plan:  - Rebolus with 3000 units IV heparin, then - Increase heparin gtt to 2400 units/hr - Check 8 hr HL  Christoper Fabianaron Netanel Yannuzzi, PharmD, BCPS Clinical pharmacist, pager [phone] 02/25/2015, 6:13 AM

## 2015-02-26 ENCOUNTER — Encounter (HOSPITAL_COMMUNITY)
Admission: EM | Disposition: A | Discharge status: Home Health | Payer: Self-pay | Source: Home / Self Care | Attending: Family Medicine

## 2015-02-26 DIAGNOSIS — I25708 Atherosclerosis of coronary artery bypass graft(s), unspecified, with other forms of angina pectoris: Secondary | ICD-10-CM

## 2015-02-26 LAB — GLUCOSE, CAPILLARY
GLUCOSE-CAPILLARY: 199 mg/dL — AB (ref 65–99)
Glucose-Capillary: 202 mg/dL — ABNORMAL HIGH (ref 65–99)
Glucose-Capillary: 279 mg/dL — ABNORMAL HIGH (ref 65–99)
Glucose-Capillary: 231 mg/dL — ABNORMAL HIGH (ref 65–99)

## 2015-02-26 LAB — CBC
HCT: 34.7 % — ABNORMAL LOW (ref 39.0–52.0)
HEMOGLOBIN: 12.2 g/dL — AB (ref 13.0–17.0)
MCH: 29.1 pg (ref 26.0–34.0)
MCHC: 35.2 g/dL (ref 30.0–36.0)
MCV: 82.8 fL (ref 78.0–100.0)
Platelets: 177 10*3/uL (ref 150–400)
RBC: 4.19 MIL/uL — ABNORMAL LOW (ref 4.22–5.81)
RDW: 14.1 % (ref 11.5–15.5)
WBC: 5.7 10*3/uL (ref 4.0–10.5)

## 2015-02-26 LAB — HEPARIN LEVEL (UNFRACTIONATED): HEPARIN UNFRACTIONATED: 0.43 [IU]/mL (ref 0.30–0.70)

## 2015-02-26 LAB — BASIC METABOLIC PANEL
ANION GAP: 10 (ref 5–15)
BUN: 34 mg/dL — ABNORMAL HIGH (ref 6–20)
CALCIUM: 9 mg/dL (ref 8.9–10.3)
CO2: 27 mmol/L (ref 22–32)
Chloride: 95 mmol/L — ABNORMAL LOW (ref 101–111)
Creatinine, Ser: 1.03 mg/dL (ref 0.61–1.24)
GFR calc Af Amer: >60 mL/min (ref >60)
GLUCOSE: 239 mg/dL — AB (ref 65–99)
Potassium: 4.1 mmol/L (ref 3.5–5.1)
SODIUM: 132 mmol/L — AB (ref 135–145)

## 2015-02-26 LAB — HEMOGLOBIN A1C
Hgb A1c MFr Bld: 7.2 % — ABNORMAL HIGH (ref 4.8–5.6)
Mean Plasma Glucose: 160 mg/dL

## 2015-02-26 SURGERY — LEFT HEART CATH AND CORS/GRAFTS ANGIOGRAPHY
Anesthesia: LOCAL

## 2015-02-26 MED ORDER — LISINOPRIL 5 MG PO TABS
5.0000 mg | ORAL_TABLET | Freq: Every day | ORAL | Status: DC
  Administered 2015-02-27: 5 mg via ORAL
  Filled 2015-02-26 (×2): qty 1

## 2015-02-26 MED ORDER — HEPARIN SODIUM (PORCINE) 5000 UNIT/ML IJ SOLN: 5000.0000 [IU] | Freq: Three times a day (TID) | INTRAMUSCULAR | Status: DC

## 2015-02-26 MED ORDER — METOLAZONE 5 MG PO TABS
5.0000 mg | ORAL_TABLET | Freq: Every day | ORAL | Status: DC
  Administered 2015-02-26 – 2015-02-28 (×3): 5 mg via ORAL
  Filled 2015-02-26 (×2): qty 1

## 2015-02-26 NOTE — Clinical Social Work Note (Signed)
Clinical Social Work Assessment  Patient Details  Name: [name] MRN: [id number] Date of Birth: 1946/01/24  Date of referral:  02/26/15               Reason for consult:  Facility Placement                Permission sought to share information with:  Family Supports Permission granted to share information::  Yes, Verbal Permission Granted  Name::     [name]  Relationship::   Wife  Contact Information:  [phone]  Housing/Transportation Living arrangements for the past 2 months:  Single Family Home Source of Information:  Patient Patient Interpreter Needed:  None Criminal Activity/Legal Involvement Pertinent to Current Situation/Hospitalization:  No - Comment as needed Significant Relationships:  Spouse Lives with:  Self, Spouse Do you feel safe going back to the place where you live?  Yes Need for family participation in patient care:  Yes (Comment)  Care giving concerns:  PT recommends that patient be placed in SNF facility at discharge but patient adamantly refuses to go to a SNF. Patient states that he wants to go home and will go home at discharge.    Social Worker assessment / plan: BSW intern met with patient at bedside to complete assessment. Patient stated that he came to the hospital for gout, but patient stated that doctors told him that he might have had a heart attack. Patient discussed that he has had several heart attacks in the past. BSW intern asked patient about his living situation. Patient states that he lives at home with his wife. Patient stated that he lives in [place], which is in [place]. Patient states that his main source of transportation is his wife. BSW intern asked patient if he had any other supports aside from his wife and patient said his wife was his main support. BSW intern explained to patient that PT had recommended SNF at discharge for short-term rehab, but patient stated that he did not want to go to a facility. BSW  Intern asked patient multiple times where he wanted to go at discharge and patient was adamant about going home. Patient stated that once his gout was resolved he would be much better, so that is why he did not want to go to rehab. Patient stated that he did not want help because he did not want to be imposing.  BSW intern attempted to contact wife and discuss the conversation that BSW intern had with the patinet, but was not successful in getting in touch with the patient's wife. BSW intern will continue to assist as needed.  Employment status:  Retired Forensic scientist:  Medicare PT Recommendations:  [place] / Referral to community resources:  Other (Comment Required) (Patient refuses to be placed in a facility)  Patient/Family's Response to care:  Patient said that health care professionals were generally helpful. Patient was pleasant and strong-willed.     Patient/Family's Understanding of and Emotional Response to Diagnosis, Current Treatment, and Prognosis:  Patient has good understanding of his current condition, diagnosis, and prognosis. Patient understands that the PT recommends SNF for rehab, but he wants to return home at discharge.   Emotional Assessment Appearance:  Appears stated age, Well-Groomed Attitude/Demeanor/Rapport:  Other (Talkative, Direct) Affect (typically observed):  Frustrated, Blunt Orientation:  Oriented to Self, Oriented to Place, Oriented to  Time, Oriented to Situation Alcohol / Substance use:  Not Applicable Psych involvement (Current and /or in the  community):  No (Comment)  Discharge Needs  Concerns to be addressed:  Patient refuses services (Patient refuses to be placed in a facility) Readmission within the last 30 days:  No Current discharge risk:  Other (Patient wishes to return home, even though PT reccommends SNF) Barriers to Discharge:  Continued Medical Work up  [employer], [phone]

## 2015-02-26 NOTE — Progress Notes (Signed)
Per patient refuse to take pre cath allergy medications. Patient states he will talk to the doctor regarding the medications first. Patient also states he is unsure if he will even have the heart catheterization today. Will continue to monitor.

## 2015-02-26 NOTE — Progress Notes (Signed)
ANTICOAGULATION CONSULT NOTE - Follow Up Consult  Pharmacy Consult for heparin Indication: chest pain/ACS    Patient Measurements: Height: 5\' 9"  (175.3 cm) Weight:  (patient cannot stand to get weight) IBW/kg (Calculated) : 70.7 Heparin Dosing Weight: 100 kg  Vital Signs: Temp: 98 F (36.7 C) (10/23 1945) Temp Source: Oral (10/23 1945) BP: 110/68 mmHg (10/23 2129) Pulse Rate: 68 (10/23 2129)  Labs:  Recent Labs  02/23/15 1220  02/24/15 0646  02/24/15 1831 02/25/15 0023 02/25/15 0401 02/25/15 0636 02/25/15 1130 02/25/15 1610 02/26/15 0150  HGB 11.5*  --  12.0*  --   --   --  11.7*  --   --   --  12.2*  HCT 35.2*  --  37.2*  --   --   --  36.4*  --   --   --  34.7*  PLT 277  --  300  --   --   --  318  --   --   --  177  LABPROT  --   --  17.1*  --   --   --   --   --   --   --   --   INR  --   --  1.38  --   --   --   --   --   --   --   --   HEPARINUNFRC  --   < > 0.10*  --  <0.10*  --  0.18*  --   --  0.26*  --   CREATININE 1.01  --  1.15  --   --   --  1.30*  --   --   --   --   CKTOTAL 181  --   --   --   --   --   --   --   --   --   --   TROPONINI 0.05*  < > 1.93*  < > 1.26* 0.78*  --  0.51* 0.41*  --   --   < > = values in this interval not displayed.  Estimated Creatinine Clearance: 69.3 mL/min (by C-G formula based on Cr of 1.3).   Assessment: 69 yo male on heparin for ACS. Heparin level therapeutic (0.43) - currently lab not crossing into EPIC but called lab for the value. No bleeding noted. Hgb stable, plt down to 177  (baseline 277). Plan for cardiac cath today.  Goal of Therapy:  Heparin level 0.3-0.7 units/ml Monitor platelets by anticoagulation protocol: Yes   Plan:  - Continue heparin at 2600 units/hr - F/u post cath  Christoper Fabianaron Bharat Antillon, PharmD, BCPS Clinical pharmacist, pager [phone] 02/26/2015,4:36 AM

## 2015-02-26 NOTE — Progress Notes (Signed)
Physical Therapy Treatment Patient Details Name: [name] MRN: [id number] DOB: 1946-02-17 Today's Date: 02/26/2015    History of Present Illness [name] is a 69 y.o. male presenting with right knee pain x4-5 days and chest pain . 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  Further cardiac work up continues.    PT Comments    Limited to standing, transfers and bed mobility.  Pt will not allow much exercise for the LE's and unable to ambulate yet.    Standing is improving as well as transfers.  Follow Up Recommendations  SNF;Other (comment);Supervision/Assistance - 24 hour (pt will refuse and go home)     Equipment Recommendations  None recommended by PT    Recommendations for Other Services       Precautions / Restrictions Precautions Precautions: Fall    Mobility  Bed Mobility Overal bed mobility: Needs Assistance Bed Mobility: Sit to Supine       Sit to supine: Min assist   General bed mobility comments: assist with LE when returning to bed.  pt able to bridge to center of the bed and to allow for straightening the pad.  Transfers Overall transfer level: Needs assistance Equipment used: Rolling walker (2 wheeled) Transfers: Sit to/from Stand Sit to Stand: Mod assist;+2 physical assistance Stand pivot transfers: +2 safety/equipment;Min assist       General transfer comment: Cues for hand placement and technque.  Pt able to attain standing today with 2 person assist from the side.  x2.  Transfer with RW was guarded.  Pt shuffled feet to pivot without clearing his feet from the floor.  Ambulation/Gait             General Gait Details: pt unable   Stairs            Wheelchair Mobility    Modified Rankin (Stroke Patients Only)       Balance Overall balance  assessment: Needs assistance Sitting-balance support: No upper extremity supported Sitting balance-Leahy Scale: Fair     Standing balance support: Bilateral upper extremity supported Standing balance-Leahy Scale: Poor Standing balance comment: heavily reliant on RW                    Cognition Arousal/Alertness: Awake/alert Behavior During Therapy: WFL for tasks assessed/performed Overall Cognitive Status: Within Functional Limits for tasks assessed                      Exercises      General Comments        Pertinent Vitals/Pain Pain Assessment: Faces Pain Score:  (3-4 at beginning of session in chair) Faces Pain Scale: Hurts even more Pain Location: bil LE's, L knees and R leg Pain Descriptors / Indicators: Grimacing Pain Intervention(s): Limited activity within patient's tolerance    Home Living Family/patient expects to be discharged to:: Private residence Living Arrangements: Spouse/significant other Available Help at Discharge: Available PRN/intermittently Type of Home: House Home Access: Stairs to enter   Home Layout: One level Home Equipment: Environmental consultant - 2 wheels;Crutches;Cane - single point;Shower seat      Prior Function Level of Independence: Needs assistance    ADL's / Homemaking Assistance Needed: reports wife assists with LB dressing at times     PT Goals (current goals can now be found  in the care plan section) Acute Rehab PT Goals Patient Stated Goal: be able to get up and move PT Goal Formulation: With patient Time For Goal Achievement: 03/09/15 Potential to Achieve Goals: Good Progress towards PT goals: Progressing toward goals;Not progressing toward goals - comment (limited by gout pain, but progressing minimally)    Frequency  Min 3X/week    PT Plan Current plan remains appropriate    Co-evaluation   Reason for Co-Treatment: For patient/therapist safety   OT goals addressed during session: Other (comment) (functional  mobility)     End of Session   Activity Tolerance: Patient limited by pain Patient left: in bed;with call bell/phone within reach;Other (comment) (overlay mattress)     Time: [id number] PT Time Calculation (min) (ACUTE ONLY): 24 min  Charges:  $Therapeutic Activity: 8-22 mins                    G Codes:      Humaira Sculley, Eliseo GumKenneth V 02/26/2015, 1:36 PM 02/26/2015  [place] BingKen Desire Fulp, PT [phone]  (pager)

## 2015-02-26 NOTE — Progress Notes (Signed)
Spoke with patient's admitting nurse to this department, states at time of admission patient refused sacral assessment due to pain.  Stage 2 to sacrum discovered on 10/22.  Patient admits to being stuck in chair for 4 days prior to admit.  Colman Caterarpley, Costas Sena Danielle

## 2015-02-26 NOTE — Evaluation (Addendum)
Occupational Therapy Evaluation Patient Details Name: [name] MRN: [id number] DOB: 1946-01-24 Today's Date: 02/26/2015    History of Present Illness [name] Ball is a 69 y.o. male presenting with right knee pain x4-5 days and chest pain . 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  Further cardiac work up continues.   Clinical Impression   Pt admitted with above. Pt received assist with LB dressing at times from wife, PTA. Feel pt will benefit from acute OT to increase independence prior to d/c. Recommending SNF but pt does not seem agreeable to this at this time. If pt goes home, recommend HHOT.    Follow Up Recommendations  SNF    Equipment Recommendations  3 in 1 bedside comode    Recommendations for Other Services       Precautions / Restrictions Precautions Precautions: Fall      Mobility Bed Mobility Overal bed mobility: Needs Assistance Bed Mobility: Sit to Supine       Sit to supine: Min assist   General bed mobility comments: assist with LE when returning to bed.  Transfers Overall transfer level: Needs assistance Equipment used: Rolling walker (2 wheeled) Transfers: Sit to/from [employer]Stand;Stand Pivot Transfers Sit to Stand: Mod assist;+2 physical assistance Stand pivot transfers: +2 safety/equipment;Min assist            Balance     Relying on RW for support with standing and stand pivot. +2 (for safety) Min assist for stand pivot transfer.                                         ADL Overall ADL's : Needs assistance/impaired     Grooming: Wash/dry face;Set up;Sitting               Lower Body Dressing: +2 for safety/equipment;Maximal assistance   Toilet Transfer: +2 for physical assistance;Moderate assistance;+2 for safety/equipment;RW (+2 Mod  (physical A) for sit to stand; +2 min (safety)-pivot)           Functional mobility during ADLs: Minimal assistance;+2 for safety/equipment;Rolling walker (stand pivot) General ADL Comments: Reports he doesn't wear socks.      Vision     Perception     Praxis      Pertinent Vitals/Pain Pain Assessment: 0-10 Pain Score:  (3-4 at beginning of session in chair) Pain Location: bilateral LEs Pain Descriptors / Indicators: Grimacing Pain Intervention(s): Monitored during session;Limited activity within patient's tolerance     Hand Dominance     Extremity/Trunk Assessment Upper Extremity Assessment Upper Extremity Assessment: Overall WFL for tasks assessed   Lower Extremity Assessment Lower Extremity Assessment: Defer to PT evaluation       Communication Communication Communication: No difficulties   Cognition Arousal/Alertness: Awake/alert Behavior During Therapy: WFL for tasks assessed/performed Overall Cognitive Status: Within Functional Limits for tasks assessed                     General Comments       Exercises       Shoulder Instructions      Home Living Family/patient expects to be discharged to:: Private residence Living Arrangements: Spouse/significant other Available Help at Discharge: Available PRN/intermittently Type of Home: House Home Access:  Stairs to enter [employer] of Steps: 2   Home Layout: One level     Bathroom Shower/Tub: Walk-in shower         Home Equipment: Environmental consultant - 2 wheels;Crutches;Cane - single point;Shower seat          Prior Functioning/Environment Level of Independence: Needs assistance    ADL's / Homemaking Assistance Needed: reports wife assists with LB dressing at times        OT Diagnosis: Acute pain   OT Problem List:     OT Treatment/Interventions: Patient/family education;Balance training;Self-care/ADL training;DME and/or AE instruction;Therapeutic activities    OT Goals(Current  goals can be found in the care plan section) Acute Rehab OT Goals Patient Stated Goal: not stated OT Goal Formulation: With patient Time For Goal Achievement: 03/05/15 Potential to Achieve Goals: Good ADL Goals Pt Will Perform Grooming: with min guard assist;standing Pt Will Perform Lower Body Bathing: sit to/from stand;with min assist;with adaptive equipment Pt Will Perform Lower Body Dressing: with min assist;sit to/from stand Pt Will Transfer to Toilet: with min assist;ambulating;bedside commode Pt Will Perform Toileting - Clothing Manipulation and hygiene: with min assist;sit to/from stand  OT Frequency: Min 2X/week   Barriers to D/C:            Co-evaluation PT/OT/SLP Co-Evaluation/Treatment: Yes Reason for Co-Treatment: For patient/therapist safety   OT goals addressed during session: Other (comment) (functional mobility)      End of Session Equipment Utilized During Treatment: Rolling walker  Activity Tolerance: Patient limited by pain Patient left: in bed;with call bell/phone within reach;Other (comment) (with PT)   Time: 1610-9604 OT Time Calculation (min): 24 min Charges:  OT General Charges $OT Visit: 1 Procedure OT Evaluation $Initial OT Evaluation Tier I: 1 Procedure G-CodesEarlie Raveling OTR/L [phone] 02/26/2015, 1:27 PM

## 2015-02-26 NOTE — Discharge Summary (Signed)
Family Medicine Teaching [facility] Discharge Summary  Patient name: [name] Medical record number: [id number] Date of birth: 1945-06-07 Age: 69 y.o. Gender: male Date of Admission: 02/23/2015  Date of Discharge: 02/27/2105 Admitting Physician: Tobey Grim, MD  Primary Care Provider: [facility] Consultants: cardiology, wound care  Indication for Hospitalization: gout and chest pain  Discharge Diagnoses/Problem List:  Patient Active Problem List   Diagnosis Date Noted  . Unable to ambulate   . Pressure ulcer 02/25/2015  . Pain in the chest   . NSTEMI (non-ST elevated myocardial infarction) (HCC) 02/24/2015  . Acute on chronic diastolic CHF (congestive heart failure), NYHA class 2 (HCC) 02/24/2015  . Gout   . Chest pain 02/23/2015  . CHF (congestive heart failure) (HCC)   . Bilateral leg edema 12/07/2014  . Hypotension, iatrogenic 03/02/2014  . S/P CABG x 4 04/23/2013  . DOE (dyspnea on exertion) 02/01/2013  . Morbid obesity - with likely obesity hypoventilation syndrome 10/09/2012  . CAD: CABG '95 with redo '08- s/p multiple PCIs since 06/25/2011  . Stable angina, class 2-3 chronic - it is usually occurring at night but also with moderate exertion 06/25/2011  . Pulmonary HTN, with nighttime oxygen 06/25/2011  . Diabetes mellitus, type Ball, insulin dependent (HCC) 06/25/2011  . CKD (chronic kidney disease) stage 1, GFR 90 ml/min or greater 06/25/2011  . Contrast dye induced nephropathy, history of in setting of sepsis and dehydration 06/25/2011  . Diastolic dysfunction, grade 3 on Echo- March 2011 06/25/2011  . Essential hypertension 06/25/2011  . Status post coronary artery stent placement, 06/25/11 in distal SVG to OM for instent restenosis 90% to 0% with Promus DES 06/25/2011  . Dyslipidemia, with intol to statins 06/25/2011  . Osteoarthritis of both knees, at times he must use a wheelchair. 06/25/2011  . Venous stasis ulcers (HCC) 06/25/2011     Class: Chronic    Disposition: Home  Discharge Condition: stable  Discharge Exam: see earlier progress note  Brief Hospital Course:  [name] is a 69 y.o. male presenting with right knee pain x4-5 days and chest pain, found to have NSTEMI . PMH is significant for CAD with CABGx2 with multiple PCIs, G3DD (echo 07/2009), coronary artery stent placement 2013, Pulm HTN ( home O2 req), DM2, CKD, HTN, HLD, OA, venous stasis with chronic ulcers.  NSTEMI with CAD: Patient had chest pain upon presentation to ED. EKG was performed which revealed new T wave inversions V2-V6. Echo revealed EF 60-65%. Troponins were trended and peaked at 1.93. Cardiology was consulted.Heparin and Nitro drips were initated. Nitro drip was stopped shortly after at patient's request as it "makes chest pain worse". Troponins were cycled and continued to downtrend throughout hospital course. Patient was scheduled for catheterization but refused procedure. Heparin drip was continued for 72 hours and patient was cleared for discharge by cardiology.   CHF-Patient's Metolazone was held the night before scheduled catheterization. After patient refused procedure, Metolazone was resumed. No increasing SOB, lungs remained CTAB. Home Imdur was continued in addition to nightly O2 requirement.   Gout in right knee:Patient has a history of gout. Joint aspirate in ED of right knee consistent with gout with monosodium urate crystals present. No fevers, joint erythema, or warmth. Mild WBC on CBC at 10.8 initially. Uric acid 8.4. Patient was started on Colchicine. Knee aspirate cultures NGTD. Blood Cx NGTD. Patient was given Tylenol and Dilaudid PRN for pain. Patient decline Prednisone therapy. Lisinopril was discontinued. Physical Therapy saw  patient and recommended placement at skilled nursing facility. Patient declined SNF placement. Patient was sent home with prescription for Colchicine, home health nurse and physical therapy.     Venous Stasis- RLE dopplers neg for DVT but were significant  for venous insufficency, neg for DVT. s/p [facility] No signs of infection. Wound care consult was placed. Pt refused recommended 4 layer compression but agreed to antibacterial non adherent, gauze and kerlix.   All other chronic medical conditions stable throughout admission and managed with home regimens.   Issues for Follow Up:  1. Physical Therapy; patient barely able to ambulate with walker in hospital 2. Lisinopril discontinued during hospitalization due to potential to increase uric acid. According to patient he refuses to take this medication at home anyways. Could consider starting Losartan if patient's BP is uncontrolled 3. Consider starting long term medication to prevent gout 4. Wound care for venous stasis ulcers 5. S/p NSTEM; will need to follow up with cardiology  Significant Procedures: none  Significant Labs and Imaging:   Recent Labs Lab 02/26/15 0150 02/27/15 0424 02/28/15 0315  WBC 5.7 8.9 11.6*  HGB 12.2* 11.4* 12.7*  HCT 34.7* 35.7* 39.7  PLT 177 335 353    Recent Labs Lab 02/23/15 1220 02/24/15 0646 02/25/15 0401 02/26/15 0150 02/27/15 0424 02/28/15 0315  NA 134* 132* 134* 132* 140 136  K 3.6 3.2* 3.3* 4.1 3.8 3.7  CL 97* 89* 94* 95* 95* 93*  CO2 28 32 GLUCOSE 164* 192* 186* 239* 211* 200*  BUN 19 19 33* 34* 18 16  CREATININE 1.01 1.15 1.30* 1.03 0.93 0.92  CALCIUM 8.7* 9.0 8.8* 9.0 9.6 9.9  ALKPHOS 99 128*  --   --   --   --   AST 21 34  --   --   --   --   ALT 19 30  --   --   --   --   ALBUMIN 2.5* 2.4*  --   --   --   --     No results found.   Results/Tests Pending at Time of Discharge: none  Discharge Medications:    Medication List    STOP taking these medications        ibuprofen 200 MG tablet  Commonly known as:  ADVIL,MOTRIN      TAKE these medications        B COMPLEX PO  Take 2 capsules by  mouth daily.     CINNAMON PO  Take 1,000 mg by mouth 2 (two) times daily. With chrondtin     clopidogrel 75 MG tablet  Commonly known as:  PLAVIX  Take 75 mg by mouth at bedtime.     colchicine 0.6 MG tablet  Take 1 tablet (0.6 mg total) by mouth daily.     CoQ10 200 MG Caps  Take 200 mg by mouth daily.     diclofenac sodium 1 % Gel  Commonly known as:  VOLTAREN  Apply 2 g topically 4 (four) times daily as needed.     furosemide 40 MG tablet  Commonly known as:  LASIX  Take 1 to 2 tablet  a day for swelling.     Grape Seed 50 MG Caps  Take 50 mg by mouth daily.     HYALURONIC ACID PO  Take 100 mg by mouth daily.     insulin aspart 100 UNIT/ML injection  Commonly known as:  novoLOG  Inject into the skin See admin instructions. 5 grams carbs = 1 unit,  11 gram protein = 1 unit,    10 points over 100 cbg = 1 unit     insulin NPH Human 100 UNIT/ML injection  Commonly known as:  HUMULIN N,NOVOLIN N  Inject 35-40 Units into the skin daily.     isosorbide mononitrate 30 MG 24 hr tablet  Commonly known as:  IMDUR  Take 15 mg by mouth 3 (three) times daily.     lisinopril 5 MG tablet  Commonly known as:  PRINIVIL,ZESTRIL  Take 1 tablet (5 mg total) by mouth daily.     Magnesium Oxide 250 MG Tabs  Take 500 mg by mouth daily.     metolazone 5 MG tablet  Commonly known as:  ZAROXOLYN  Take 5 mg by mouth daily. For fluid     metoprolol 50 MG tablet  Commonly known as:  LOPRESSOR  Take 1 tablet (50 mg total) by mouth 3 (three) times daily.     nitroGLYCERIN 0.4 MG SL tablet  Commonly known as:  NITROSTAT  Place 0.4 mg under the tongue every 5 (five) minutes as needed. For chest pain     potassium chloride SA 20 MEQ tablet  Commonly known as:  K-DUR,KLOR-CON  Take 30-40 mEq by mouth daily. Take 40 meq 3 times a week,and 30 meq 4 times aweek     Saw Palmetto (Serenoa repens) 450 MG Caps  Take 450 mg by mouth daily.     Vitamin D2 2000 UNITS Tabs  Take 6,000 mg by  mouth daily.        Discharge Instructions: Please refer to Patient Instructions section of EMR for full details.  Patient was counseled important signs and symptoms that should prompt return to medical care, changes in medications, dietary instructions, activity restrictions, and follow up appointments.   Follow-Up Appointments:   Follow-up Information    [facility]   Why:  Registered Nurse, Physical Therapy and Aide.    Contact information:   [address] [place] [address] [phone]       [facility]. Schedule an appointment as soon as possible for a visit in 1 week.   Specialty:  General Practice   Why:  Hospital Follow Up    Contact information:   [address] [place] [address] [phone]       Follow up with Theodore DemarkBarrett, Rhonda, PA-C On 03/15/2015.   Specialties:  Cardiology, Radiology   Why:  3:30 PM (Dr. Elissa HeftyHarding's PA)   Contact information:   [address] [place] [address] [phone]        Beaulah Dinninghristina M Cherita Hebel, MD 02/26/2015, 4:49 PM PGY-1, [facility]

## 2015-02-26 NOTE — Progress Notes (Signed)
Patient Name: [name] Date of Encounter: 02/26/2015  Primary Cardiologist: Dr. Herbie Baltimore   Principal Problem:   NSTEMI (non-ST elevated myocardial infarction) [facility]) Active Problems:   CAD: CABG '95 with redo '08- s/p multiple PCIs since   Pulmonary HTN, with nighttime oxygen   Diabetes mellitus, type Ball, insulin dependent (HCC)   CKD (chronic kidney disease) stage 1, GFR 90 ml/min or greater   Essential hypertension   Dyslipidemia, with intol to statins   Acute on chronic diastolic CHF (congestive heart failure), NYHA class 2 (HCC)   Gout   Pain in the chest   Pressure ulcer    SUBJECTIVE  Denies any CP or SOB. He questioned at usefulness of cath at this point, says he has lived a long life, longer than what he expected given everything else going on. He also states he never had contrast allergy despite lots of cath before, however admits he was allergic to contrast with CT  CURRENT MEDS . antiseptic oral rinse  7 mL Mouth Rinse q12n4p  . aspirin  81 mg Oral Daily  . chlorhexidine  15 mL Mouth Rinse BID  . clopidogrel  75 mg Oral QHS  . colchicine  0.6 mg Oral Daily  . famotidine  20 mg Oral Pre-Cath  . insulin aspart  0-15 Units Subcutaneous TID WC  . insulin aspart  0-5 Units Subcutaneous QHS  . insulin NPH Human  35 Units Subcutaneous Q supper  . isosorbide mononitrate  15 mg Oral TID  . metoprolol  50 mg Oral TID  . predniSONE  60 mg Oral Once   Followed by  . predniSONE  60 mg Oral Pre-Cath  . sodium chloride  3 mL Intravenous Q12H    OBJECTIVE  Filed Vitals:   02/25/15 1718 02/25/15 1945 02/25/15 2129 02/26/15 0520  BP: 158/118 98/62 110/68 118/56  Pulse: 60 70 68   Temp:  98 F (36.7 C)  97.9 F (36.6 C)  TempSrc:  Oral  Oral  Resp:  19  18  Height:      Weight:      SpO2:  97%  97%    Intake/Output Summary (Last 24 hours) at 02/26/15 0733 Last data filed at 02/26/15 0520  Gross per 24 hour  Intake 975.73 ml  Output   1575 ml  Net  -599.27 ml   Filed Weights   02/23/15 1900 02/23/15 2000  Weight: 276 lb 7.3 oz (125.4 kg) 276 lb 9.6 oz (125.465 kg)    PHYSICAL EXAM  General: Pleasant, NAD. Neuro: Alert and oriented X 3. Moves all extremities spontaneously. Psych: Normal affect. HEENT:  Normal  Neck: Supple without bruits or JVD. Lungs:  Resp regular and unlabored, CTA. Heart: RRR no s3, s4, or murmurs. Abdomen: Soft, non-tender, non-distended, BS + x 4.  Extremities: No clubbing, cyanosis. DP/PT/Radials 2+ and equal bilaterally. LE skin scally, chronic cellulitis appearance, wrapped in dressing.  Accessory Clinical Findings  CBC  Recent Labs  02/23/15 1220  02/25/15 0401 02/26/15 0150  WBC 13.5*  < > 10.8* 5.7  NEUTROABS 10.9*  --   --   --   HGB 11.5*  < > 11.7* 12.2*  HCT 35.2*  < > 36.4* 34.7*  MCV 83.8  < > 83.7 82.8  PLT 277  < > 318 177  < > = values in this interval not displayed. Basic Metabolic Panel  Recent Labs  02/24/15 0646 02/25/15 0401  NA 132* 134*  K 3.2* 3.3*  CL 89* 94*  CO2 32 29  GLUCOSE 192* 186*  BUN 19 33*  CREATININE 1.15 1.30*  CALCIUM 9.0 8.8*   Liver Function Tests  Recent Labs  02/23/15 1220 02/24/15 0646  AST 21 34  ALT 19 30  ALKPHOS 99 128*  BILITOT 1.2 0.8  PROT 6.2* 6.6  ALBUMIN 2.5* 2.4*   Cardiac Enzymes  Recent Labs  02/23/15 1220  02/25/15 0023 02/25/15 0636 02/25/15 1130  CKTOTAL 181  --   --   --   --   TROPONINI 0.05*  < > 0.78* 0.51* 0.41*  < > = values in this interval not displayed. Fasting Lipid Panel  Recent Labs  02/24/15 0646  CHOL 214*  HDL 28*  LDLCALC 167*  TRIG 97  CHOLHDL 7.6   Thyroid Function Tests  Recent Labs  02/24/15 0646  TSH 2.127    TELE NSR without significant ventricular ectopy    ECG  10/22 Significant ST depression with TWI in anterolateral leads  Echocardiogram 02/24/2015  LV EF: 60% -  65%  ------------------------------------------------------------------- Indications:    Atrial fibrillation - 427.31.  ------------------------------------------------------------------- History:  PMH: Sever venous stasis ulcers of lower extremity. CAD: CABG &'95 with redo &'08-S/P multiple PCIs since. Chest pain. Dyspnea and hypotension. Congestive heart failure. Primary pulmonary hypertension. Risk factors: Hypertension. Diabetes mellitus. Dyslipidemia.  ------------------------------------------------------------------- Study Conclusions  - Left ventricle: The cavity size was normal. Wall thickness was increased in a pattern of moderate LVH. Systolic function was normal. The estimated ejection fraction was in the range of 60% to 65%. - Aortic valve: Mildly calcified annulus. Mildly thickened leaflets. - Mitral valve: There was mild to moderate regurgitation. - Left atrium: The atrium was mildly dilated.    Radiology/Studies  Dg Tibia/fibula Left  02/23/2015  CLINICAL DATA:  Lower extremity cellulitis and pain EXAM: LEFT TIBIA AND FIBULA - 2 VIEW COMPARISON:  None. FINDINGS: Degenerative changes are noted in the medial joint space. No acute fracture or dislocation is noted. No joint effusion is seen. On the lateral film distally there is suggestion of an anterior soft tissue ulcer. Clinical correlation is recommended. No other soft tissue abnormality is seen. IMPRESSION: No acute bony abnormality noted. Possible soft tissue ulcer distally. Electronically Signed   By: Alcide Clever M.D.   On: 02/23/2015 13:51   Dg Tibia/fibula Right  02/23/2015  CLINICAL DATA:  Cellulitis EXAM: RIGHT TIBIA AND FIBULA - 2 VIEW COMPARISON:  None. FINDINGS: Severe degenerative change of the knee joint with narrowing of the medial compartment. No acute fracture. No dislocation. Degenerative changes at the ankle. Small bony density projects over the central knee joint. Loose body is not excluded. IMPRESSION: No acute bony pathology.  Chronic changes. Electronically Signed    By: Jolaine Click M.D.   On: 02/23/2015 13:46   Dg Chest Port 1 View  02/24/2015  CLINICAL DATA:  Nonradiating chest pain, shortness of breath EXAM: PORTABLE CHEST 1 VIEW COMPARISON:  06/25/2011 FINDINGS: Mild left basilar scarring/ atelectasis. No focal consolidation. No pleural effusion or pneumothorax. Cardiomegaly. Postsurgical changes related to prior CABG. Median sternotomy. IMPRESSION: Mild left basilar scarring/atelectasis. No evidence of acute cardiopulmonary disease. Electronically Signed   By: Charline Bills M.D.   On: 02/24/2015 00:18   Dg Knee Complete 4 Views Right  02/23/2015  CLINICAL DATA:  Bilateral cellulitis with redness, swelling and pain. EXAM: RIGHT KNEE - COMPLETE 4+ VIEW COMPARISON:  None. FINDINGS: There are tricompartmental degenerative changes most prominent over the medial compartment. Evidence  of a small joint effusion. Mild subcutaneous edema. No air within the soft tissues. No fracture or dislocation. Multiple surgical clips over the medial soft tissues. Calcified atherosclerotic plaque is seen over the femoral, popliteal and lower leg runoff arteries. IMPRESSION: No acute findings. 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 Small joint effusion. Electronically Signed   By: Elberta Fortis M.D.   On: 02/23/2015 13:45    ASSESSMENT AND PLAN  1. NSTEMI  - Echo 02/24/2015 EF 60-65%, moderate LVH, mild to moderate MR  - ST depression with TWI in anterolateral leads  - pt had significant edema after contrast dye used in CT scan in 2012, refused premedications for contrast allergy last night, says he never had contrast dye allergy with prior multiple cath before.  - discussed with Dr. Herbie Baltimore, pt does not want to go through with cath, will medically manage. Continue IV heparin for 72 hrs (started Fri night). Ambulate today. Likely discharge tomorrow if asymptomatic with ambulation.  2. CAD s/p CABG (redo in 2008)  - cath 06/25/2011 LIMA to LAD  patent, SVG to OM1-OM2 90% stenosis treated with DES, SVG to RCA and SVG to D1 occluded. (from 2008: fRad-OM 100%, SVG-RCA 100%)  3. Chronic diastolic HF  4. Pulm HTN on qHS O2  5. HTN: intolerant to statin 6. HLD 7. DM2  Signed, Amedeo Plenty Pager: [phone]   ATTENDING ATTESTATION:  Mr. Klingbeil is well known to me as my clinic patient.  He is very difficult as far as his questioning treatment options and is quite in touch with his symptomatology. He is not really sure why her levels were checked when he never really complained of chest pain. He has quite frequent episodes of angina decubitus as well as exertional angina that is basically a chronic stable condition for him. According to him, he has not noted any anginal episodes of this hospital stay. He came in because his leg was hurting him and now is being told he has an MI. I tried explaining to him that his troponin levels were elevated and his EKG is grossly abnormal which is a change for him. I was discussing the pros and cons of heart catheterization that Dr. [place] went in great deal with yesterday. After long discussion, he indicated that he is "69 years old, and all washed up with minimal heart left ", and with this being the case he would prefer to withhold cardiac catheterization unless he has active symptoms. Reviewing his previous catheterization data would indicate that he really has a patent LIMA to LAD diffusely diseased LAD as well as a extensively stented vein graft to really 1 marginal branch that itself is diffusely diseased. Either this could be the culprit. Plan to him the importance of trying to keep the vein graft patent. I did not want to become worse of therefore gave the patient the option with my recommendation to proceed with catheterization: Along with Dr. Elvis Coil advice. The patient chose to continue medical management of of the non-ST elevation MI and not pursue invasive evaluation unless he has recurrent  symptoms.  He is more focused on having his wound care and "gout" taking care of as well as his edema.  At this point, would continue IV heparin throughout the course the day complete 72 hours. Would try to return back to close possible home medications. He has tendency to adjust his own medications at home. He appears to be relatively euvolemic, would return back to home doses of Lasix.  I anticipate that he would be related to leave maybe by tomorrow if he remains euvolemic and a systematic. He understands that if he were to have recurrent chest pain that he may not actually have me doing heart catheterization later on this week. He is agreeable with this plan.   Marykay LexHARDING, Ryley Bachtel W, M.D., M.S. Interventional Cardiologist   Pager # [phone]

## 2015-02-26 NOTE — Progress Notes (Signed)
Inpatient Diabetes Program Recommendations  AACE/ADA: New Consensus Statement on Inpatient Glycemic Control (2015)  Target Ranges:  Prepandial:   less than 140 mg/dL      Peak postprandial:   less than 180 mg/dL (1-2 hours)      Critically ill patients:  140 - 180 mg/dL   Review of Glycemic Control  Results for Clarene EssexDELOOZE, Glade G II (MRN [id number]) as of 02/26/2015 13:52  Ref. Range 02/25/2015 11:40 02/25/2015 16:54 02/25/2015 21:20 02/26/2015 07:36 02/26/2015 11:27  Glucose-Capillary Latest Ref Range: 65-99 mg/dL 865313 (H) 784221 (H) 696251 (H) 202 (H) 199 (H)    Diabetes history: Type 2  A1C 7.2% on 02/24/15  Outpatient Diabetes medications: Novolog 1 units for 5 grams carbs, 1 unit for 11 grams of protein and 1 unit for every 10 points above 100mg /dl, NPH 29-5235-40 units qday Current orders for Inpatient glycemic control: NPH 35 units qday, Novolog 0-15 units tid, Novolog 0-5 units qhs  Inpatient Diabetes Program Recommendations: Patient refused all correction and basal insulins yesterday and correction today- I spoke to his nurse today and patient continues to refuse.   Susette RacerJulie Roan Miklos, RN, BA, MHA, CDE Diabetes Coordinator Inpatient Diabetes Program  [phone] (Team Pager) [phone] [facility] Office) 02/26/2015 2:01 PM

## 2015-02-26 NOTE — Progress Notes (Signed)
Family Medicine Teaching Service Daily Progress Note Intern Pager: [phone]  Patient name: [name] Medical record number: [id number] Date of birth: 1946/01/29 Age: 69 y.o. Gender: male  Primary Care Provider: [facility] Consultants: Cardiology Code Status: Full Code    Pt Overview and Major Events to Date:  10/21: Admit for gout of right knee, chest pain 10/21: worsening chest pain, thought to be consistent with NSTEMI  Assessment and Plan:  [name] is a 69 y.o. male presenting with right knee pain x4-5 days and chest pain . 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  EKG - T wave inversions V2-V6. Echo: EF 60-65%. Troponin peaked at 1.93 yesterday morning, now downtrending: 1.26>>0.78>>0.51. TSH normal,  Nitro drip stopped at patient request as it "makes chest pain worse". Denies chest pain or shortness of breath.  - Pt does not want to go through with cath. Per cardiology, continue IV heparin for 72 hrs (started 10/21 at 2230). - Ambulate today, PT following (recommended SNF) - Continue to follow with cardiology, appreciate their assistance  - Continue to cycle troponins  CHF-   Metolazone at home. Diuresed 1.5 L last 24 hours.  No increasing SOB, CTAB. No O2 requirement.  - Resume metolazone since no cath today - continue home Imdur - has home nightly O2 requirement, continue while here.   Gout in right knee:  Joint aspirates in ED consistent with Gout with monosodium urate crystals present. No fevers, joint erythema or warmth makes septic arthritis unlikely, mild WBC to 10.8>>5.7. Hx of gout. Uric acid 8.4. Knee aspirate cultures NGTD. Blood Cx NGTD.  - PT recommended SNF yesterday,  continue PT  - Patient refused Prednisone, continue Colchicine - Tylenol, dilaudid PRN pain  Venous Stasis- RLE dopplers neg for DVT, final read pending. 12/29/2014 had b/l dopplers which were sig for venous insufficency, neg for DVT. s/p [facility] No signs of infection. - wound care consult. Pt refuses recommended 4 layer compression but agrees to antibacterial non adherent, gauze and kerlix.   HTN- Blood pressure stable  - Continue home lopressor - Add Lisinopril back  DM Takes NPH 35-40 units qD, with sliding scale, no A1c in our system -  mod SSI - continue home NPH 35, titrate as needed - ACBG ACHS  HLD- 10/22 Lipid Panel; Cholesterol 214, LDL 167, HDL 28. Given h/o mulitiple coronary events would start statin, but has previously not been tolerated by pt - No statin at this time as patient not agreeable  CKD- Cr 1.03 today. Baseline near 1. - Continue to follow Cr, will need to call lab for BMET results - Add back Metolazone and Lisinopril   FEN/GI: Heart/ healthy /Carb modified diet, SLIV Prophylaxis: Lovenox ppx Disposition: likely SNF tomorrow   Subjective:  Patient had no acute events overnight. He refused pre cath allergy medication and later refused heart catheterization which was supposed to occur today. He is adamant that he did not have a MI and that he does not need any treatment. Patient denies any chest pain, shortness of breath, nausea, diaphoresis, or abdominal pain. Admits to some knee pain still.  Objective: Temp:  [97.9 F (36.6 C)-98 F (  36.7 C)] 97.9 F (36.6 C) (10/24 0520) Pulse Rate:  [60-71] 68 (10/23 2129) Resp:  [18-19] 18 (10/24 0520) BP: (98-158)/(56-118) 118/56 mmHg (10/24 0520) SpO2:  [97 %] 97 % (10/24 0520) Physical Exam: General: NAD, AAOx3, he is sitting upright in his chair speaking in full sentences Cardiovascular: RRR, no new MGR, dorsalis pedis pulses faint.  Respiratory:  CTAB Abdomen: S, NT,ND, +BS.  Extremities: Bandages wrapped around lower extremeties. Stasis dermatitis / chronic changes with ulcerations noted on BL LE.  No erythema, no warmth. Mild tenderness to palpation throughout.  Skin: as described above Neuro: No focal deficits at this time.    Laboratory:  Recent Labs Lab 02/24/15 0646 02/25/15 0401 02/26/15 0150  WBC 12.3* 10.8* 5.7  HGB 12.0* 11.7* 12.2*  HCT 37.2* 36.4* 34.7*  PLT 300 318 177    Recent Labs Lab 02/23/15 1220 02/24/15 0646 02/25/15 0401  NA 134* 132* 134*  K 3.6 3.2* 3.3*  CL 97* 89* 94*  CO2 28 32 29  BUN 19 19 33*  CREATININE 1.01 1.15 1.30*  CALCIUM 8.7* 9.0 8.8*  PROT 6.2* 6.6  --   BILITOT 1.2 0.8  --   ALKPHOS 99 128*  --   ALT 19 30  --   AST 21 34  --   GLUCOSE 164* 192* 186*    Intake/Output Summary (Last 24 hours) at 02/26/15 0712 Last data filed at 02/26/15 0520  Gross per 24 hour  Intake 975.73 ml  Output   1575 ml  Net -599.27 ml   CBG (last 3)   Recent Labs  02/25/15 1654 02/25/15 2120 02/26/15 0736  GLUCAP 221* 251* 202*     EKG - T wave inversions V2-V6.   Imaging/Diagnostic Tests: CXR Portable-   IMPRESSION: Mild left basilar scarring/atelectasis.  No evidence of acute cardiopulmonary disease.  Douglas Dinninghristina M Abdulwahab Demelo, MD 02/26/2015, 7:12 AM PGY-1, [facility] FPTS Intern pager: [phone], text pages welcome

## 2015-02-26 NOTE — Consult Note (Signed)
WOC wound consult note Reason for Consult: LE wounds Patient with extensive history.  I spent over an hour listening to the story of the LE ulcers.  He dates the issues back at least 3 years.  He has battled chronic venous stasis ulcer on the bilateral LEs with many, many different products. [facility], surgeon. And has self directed some of his care. He reports use of silvadene, Unna's boots, sorbact hydro conductive dressings, foam dressing and reports they have not been successful. He currently leaves his legs open to air during the day and uses dry dressings and some type of compressive wrap at night.  He is not using therapeutic compression.  He does not really seem to understand the need for therapeutic compression vs. Using wraps from the malleolus to the proximal pretibial region.  I have explained this to him but he still seems to want to use only gauze dressings. I removed the dressings after about 1 hour of dialogue about the ulcers.  Wound type: venous stasis ulcers bilateral LE Measurement: L pretibial: 2.5cm x1.0cm x 0.5cm  L lateral: 2.0cm x 0.5cm x 0.2cm  L medial foot: 1.0cm x 1.5cm x 0.2cm  L lateral malleolus: 2.5cm x 2.5cm x 0.1cm  R medial foot: 2.5cm x 2.5cm x 0.1cm  R proximal medial foot: 3.0cm x 0.5cm x 0.2cm  R posterior calf: 2cm x 2cm x 0.1cm  R lateral malleolus: 5cm x 3cm x 0.2cm  R lateral malleolus distal: 3.0cm x 2.0cm x 0.2cm  Wound ZOX:WRUEbed:most of his wounds are mostly clean, the right medial foot wounds do have more slough than any of the others and are deeper.  Drainage (amount, consistency, odor) serosanguinous, no odor Periwound: hemosiderin staining, venous statis dermatitis Dressing procedure/placement/frequency: Due to the extensive history of topical therapy I did let the patient know I really have no other topical suggestions.  I did tell him that if he did not have that history  And he would agree that I would  place him in 4 layer compression however he does not want to do that at this time.  We agreed to use antibacterial non adherent, gauze and kerlix.  Change every other day and PRN if soilage occurs between dressing changes.  Hallelujah Wysong ManterAustin RN,CWOCN [phone]

## 2015-02-27 DIAGNOSIS — M10062 Idiopathic gout, left knee: Secondary | ICD-10-CM

## 2015-02-27 DIAGNOSIS — R262 Difficulty in walking, not elsewhere classified: Secondary | ICD-10-CM

## 2015-02-27 LAB — CBC
HCT: 35.7 % — ABNORMAL LOW (ref 39.0–52.0)
HEMOGLOBIN: 11.4 g/dL — AB (ref 13.0–17.0)
MCH: 26.8 pg (ref 26.0–34.0)
MCHC: 31.9 g/dL (ref 30.0–36.0)
MCV: 84 fL (ref 78.0–100.0)
PLATELETS: 335 10*3/uL (ref 150–400)
RBC: 4.25 MIL/uL (ref 4.22–5.81)
RDW: 14.3 % (ref 11.5–15.5)
WBC: 8.9 10*3/uL (ref 4.0–10.5)

## 2015-02-27 LAB — GLUCOSE, CAPILLARY
Glucose-Capillary: 185 mg/dL — ABNORMAL HIGH (ref 65–99)
Glucose-Capillary: 195 mg/dL — ABNORMAL HIGH (ref 65–99)
Glucose-Capillary: 194 mg/dL — ABNORMAL HIGH (ref 65–99)
Glucose-Capillary: 175 mg/dL — ABNORMAL HIGH (ref 65–99)

## 2015-02-27 LAB — BASIC METABOLIC PANEL
Anion gap: 15 (ref 5–15)
BUN: 18 mg/dL (ref 6–20)
CHLORIDE: 95 mmol/L — AB (ref 101–111)
CO2: 30 mmol/L (ref 22–32)
CREATININE: 0.93 mg/dL (ref 0.61–1.24)
Calcium: 9.6 mg/dL (ref 8.9–10.3)
GFR calc non Af Amer: >60 mL/min (ref >60)
GLUCOSE: 211 mg/dL — AB (ref 65–99)
Potassium: 3.8 mmol/L (ref 3.5–5.1)
Sodium: 140 mmol/L (ref 135–145)

## 2015-02-27 NOTE — Progress Notes (Signed)
Family Medicine Teaching Service Daily Progress Note Intern Pager: [phone]  Patient name: [name] Medical record number: [id number] Date of birth: [birth year] Age: 69 y.o. Gender: male  [facility] Consultants: Cardiology Code Status: Full Code    Pt Overview and Major Events to Date:  10/21: Admit for gout of right knee, chest pain 10/21: worsening chest pain, thought to be consistent with NSTEMI  Assessment and Plan:  [name] is a 69 y.o. male presenting with right knee pain x4-5 days and chest pain . 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  EKG - T wave inversions V2-V6. Echo: EF 60-65%. Troponin peaked at 1.93 yesterday morning, now downtrending: 1.26>>0.78>>0.51. TSH normal,  Nitro drip stopped at patient request as it "makes chest pain worse". Denies chest pain or shortness of breath.  - IV Heparin has been stopped - Ambulate today, PT following; recommended SNF.  - Pt on Plavix and Lopressor  CHF-   Metolazone at home. Diuresed 1.7 L last 24 hours.  No increasing SOB, CTAB. No O2 requirement.  - Continue home metolazone and Imdur - has home nightly O2 requirement, continue while here.   Gout in right knee:  Joint aspirates in ED consistent with Gout with monosodium urate crystals present. No fevers, joint erythema or warmth makes septic arthritis unlikely, mild WBC to 10.8>>5.7>>8.9. Hx of gout. Uric acid 8.4. Knee aspirate cultures NGTD. Blood Cx NGTD.  - PT recommended SNF yesterday, patient is refusing and wants to go home. continue PT  - Patient refused Prednisone, continue Colchicine - Tylenol, dilaudid PRN pain - DC Lisinopril for potential to increase  uric acid. Consider switching to Losartan.   Venous Stasis- RLE dopplers neg for DVT, final read pending. 12/29/2014 had b/l dopplers which were sig for venous insufficency, neg for DVT. s/p [facility] No signs of infection. - wound care consult. Pt refuses recommended 4 layer compression but agrees to antibacterial non adherent, gauze and kerlix.   HTN- Blood pressure stable  - Continue home lopressor   DM Takes NPH 35-40 units qD, with sliding scale, no A1c in our system -  mod SSI - continue home NPH 35, titrate as needed - ACBG ACHS  HLD- 10/22 Lipid Panel; Cholesterol 214, LDL 167, HDL 28. Given h/o mulitiple coronary events would start statin, but has previously not been tolerated by pt - No statin at this time as patient not agreeable  CKD- Cr 0.93 today. Baseline near 1. - Continue to follow Cr  FEN/GI: Heart/ healthy /Carb modified diet, SLIV Prophylaxis: Heparin ppx Disposition: SNF vs home  Subjective:  Patient had no acute events overnight. He has been refusing some medications as well as refusing SNF placement. Patient does not want to be discharged until his "legs are fixed". Explained to patient that gout and venous stasis are not conditions that can be completely resolved while in the hospital. He is medically stable for DC at this time. Patient was yelling at case management and myself while we were in the room. He was given option of home health nurse, he has not given decision of whether or not he would like this.  Patient denies any chest pain, shortness of breath, nausea, diaphoresis, or abdominal pain. Admits to some knee pain still.  Objective: Temp:  [97.6 F (36.4 C)-98.4 F (36.9 C)] 97.6 F (36.4 C) (10/25 0551) Pulse Rate:  [68-79] 68 (10/25 0551) Resp:  [18] 18 (10/25 0551) BP: (120-144)/(60-70) 144/60 mmHg (10/25 0551) SpO2:  [93 %-96 %] 95 % (10/25 0551) Physical Exam: General: NAD, AAOx3, he is  sitting upright in his chair speaking in full sentences Cardiovascular: RRR, no new MGR, dorsalis pedis pulses faint.  Respiratory: CTAB Abdomen: S, NT,ND, +BS.  Extremities: Bandages wrapped around lower extremeties.  Skin: as described above Neuro: No focal deficits at this time.    Laboratory:  Recent Labs Lab 02/25/15 0401 02/26/15 0150 02/27/15 0424  WBC 10.8* 5.7 8.9  HGB 11.7* 12.2* 11.4*  HCT 36.4* 34.7* 35.7*  PLT 318 177 335    Recent Labs Lab 02/23/15 1220 02/24/15 0646 02/25/15 0401 02/26/15 0150 02/27/15 0424  NA 134* 132* 134* 132* 140  K 3.6 3.2* 3.3* 4.1 3.8  CL 97* 89* 94* 95* 95*  CO2 28 32 BUN 19 19 33* 34* 18  CREATININE 1.01 1.15 1.30* 1.03 0.93  CALCIUM 8.7* 9.0 8.8* 9.0 9.6  PROT 6.2* 6.6  --   --   --   BILITOT 1.2 0.8  --   --   --   ALKPHOS 99 128*  --   --   --   ALT 19 30  --   --   --   AST 21 34  --   --   --   GLUCOSE 164* 192* 186* 239* 211*    Intake/Output Summary (Last 24 hours) at 02/27/15 0710 Last data filed at 02/27/15 0555  Gross per 24 hour  Intake  423.7 ml  Output   1700 ml  Net -1276.3 ml   CBG (last 3)   Recent Labs  02/26/15 1127 02/26/15 1734 02/26/15 2028  GLUCAP 199* 279* 231*     EKG - T wave inversions V2-V6.   Imaging/Diagnostic Tests: CXR Portable-   IMPRESSION: Mild left basilar scarring/atelectasis.  No evidence of acute cardiopulmonary disease.  Beaulah Dinning, MD 02/27/2015, 7:10 AM PGY-1, [facility] Family Medicine FPTS Intern pager: [phone], text pages welcome

## 2015-02-27 NOTE — Care Management Important Message (Signed)
Important Message  Patient Details  Name: [name] MRN: [id number] Date of Birth: Jun 16, 1945   Medicare Important Message Given:  Yes-second notification given    [name], [name] 02/27/2015, 1:55 PM

## 2015-02-27 NOTE — Progress Notes (Signed)
Patient Name: [name] Date of Encounter: 02/27/2015  Primary Cardiologist: Dr. Herbie Baltimore   Principal Problem:   NSTEMI (non-ST elevated myocardial infarction) [facility]) Active Problems:   CAD: CABG '95 with redo '08- s/p multiple PCIs since   Pulmonary HTN, with nighttime oxygen   Diabetes mellitus, type Ball, insulin dependent (HCC)   CKD (chronic kidney disease) stage 1, GFR 90 ml/min or greater   Essential hypertension   Dyslipidemia, with intol to statins   Acute on chronic diastolic CHF (congestive heart failure), NYHA class 2 (HCC)   Gout   Pain in the chest   Pressure ulcer    SUBJECTIVE  Denies any CP or SOB. He had a mild chest pain yesterday.  He questioned at usefulness of cath at this point, says he has lived a long life, longer than what he expected given everything else going on. He also states he never had contrast allergy despite lots of cath before, however admits he was allergic to contrast with CT. He doesn't want to go home unless his pressure ulcer is taken care of.   CURRENT MEDS . antiseptic oral rinse  7 mL Mouth Rinse q12n4p  . aspirin  81 mg Oral Daily  . chlorhexidine  15 mL Mouth Rinse BID  . clopidogrel  75 mg Oral QHS  . colchicine  0.6 mg Oral Daily  . heparin subcutaneous  5,000 Units Subcutaneous 3 times per day  . insulin aspart  0-15 Units Subcutaneous TID WC  . insulin aspart  0-5 Units Subcutaneous QHS  . insulin NPH Human  35 Units Subcutaneous Q supper  . isosorbide mononitrate  15 mg Oral TID  . lisinopril  5 mg Oral Daily  . metolazone  5 mg Oral Daily  . metoprolol  50 mg Oral TID  . predniSONE  60 mg Oral Once  . sodium chloride  3 mL Intravenous Q12H    OBJECTIVE  Filed Vitals:   02/26/15 1734 02/26/15 2000 02/27/15 0551 02/27/15 0930  BP: 123/64 121/70 144/60 130/70  Pulse: 73 79 68 68  Temp:  98.4 F (36.9 C) 97.6 F (36.4 C)   TempSrc:   Oral   Resp:  18 18   Height:      SpO2:  96% 95%      Intake/Output Summary (Last 24 hours) at 02/27/15 0936 Last data filed at 02/27/15 1610  Gross per 24 hour  Intake  423.7 ml  Output   2200 ml  Net -1776.3 ml   Filed Weights    PHYSICAL EXAM  General: Pleasant, NAD. Neuro: Alert and oriented X 3. Moves all extremities spontaneously. Psych: Normal affect. HEENT:  Normal  Neck: Supple without bruits or JVD. Lungs:  Resp regular and unlabored, CTA. Heart: RRR no s3, s4, or murmurs. Abdomen: Soft, non-tender, non-distended, BS + x 4.  Extremities: No clubbing, cyanosis. DP/PT/Radials 2+ and equal bilaterally. LE skin scally, chronic cellulitis appearance, wrapped in dressing.  Accessory Clinical Findings  CBC  Recent Labs  02/26/15 0150 02/27/15 0424  WBC 5.7 8.9  HGB 12.2* 11.4*  HCT 34.7* 35.7*  MCV 82.8 84.0  PLT 177 335   Basic Metabolic Panel  Recent Labs  02/26/15 0150 02/27/15 0424  NA 132* 140  K 4.1 3.8  CL 95* 95*  CO2 27 30  GLUCOSE 239* 211*  BUN 34* 18  CREATININE 1.03 0.93  CALCIUM 9.0 9.6   Cardiac Enzymes  Recent Labs  02/25/15 0023 02/25/15 0636 02/25/15  1130  TROPONINI 0.78* 0.51* 0.41*   TELE NSR without significant ventricular ectopy   ECG  10/22 Significant ST depression with TWI in anterolateral leads  Echocardiogram 02/24/2015  LV EF: 60% -  65%  ------------------------------------------------------------------- Indications:   Atrial fibrillation - 427.31.  ------------------------------------------------------------------- History:  PMH: Sever venous stasis ulcers of lower extremity. CAD: CABG &'95 with redo &'08-S/P multiple PCIs since. Chest pain. Dyspnea and hypotension. Congestive heart failure. Primary pulmonary hypertension. Risk factors: Hypertension. Diabetes mellitus. Dyslipidemia.  ------------------------------------------------------------------- Study Conclusions  - Left ventricle: The cavity size was normal. Wall thickness  was increased in a pattern of moderate LVH. Systolic function was normal. The estimated ejection fraction was in the range of 60% to 65%. - Aortic valve: Mildly calcified annulus. Mildly thickened leaflets. - Mitral valve: There was mild to moderate regurgitation. - Left atrium: The atrium was mildly dilated.    Radiology/Studies  Dg Chest Port 1 View  02/24/2015  CLINICAL DATA:  Nonradiating chest pain, shortness of breath EXAM: PORTABLE CHEST 1 VIEW COMPARISON:  06/25/2011 FINDINGS: Mild left basilar scarring/ atelectasis. No focal consolidation. No pleural effusion or pneumothorax. Cardiomegaly. Postsurgical changes related to prior CABG. Median sternotomy. IMPRESSION: Mild left basilar scarring/atelectasis. No evidence of acute cardiopulmonary disease.     ASSESSMENT AND PLAN  1. NSTEMI  - Echo 02/24/2015 EF 60-65%, moderate LVH, mild to moderate MR  - ST depression with TWI in anterolateral leads  - pt had significant edema after contrast dye used in CT scan in 2012, refused premedications for contrast allergy last night, says he never had contrast dye allergy with prior multiple cath before.  - discussed with Dr. Herbie BaltimoreHarding, pt does not want to go through with cath, will medically manage. IV heparin for 72 hrs completed, troponin downtrending, the patient is now asymptomatic Ambulate today. The patient can be discharged from cardiology standpoint.   2. CAD s/p CABG (redo in 2008)  - cath 06/25/2011 LIMA to LAD patent, SVG to OM1-OM2 90% stenosis treated with DES, SVG to RCA and SVG to D1 occluded. (from 2008: fRad-OM 100%, SVG-RCA 100%)  3. Chronic diastolic HF  4. Pulm HTN on qHS O2  5. HTN: intolerant to statin 6. HLD 7. DM2  Signed, Lars MassonNELSON, Demiana Crumbley H , MD 02/27/2015

## 2015-02-27 NOTE — Care Management Note (Addendum)
Case Management Note  Patient Details  Name: Douglas EssexRaymond G Kwiecinski II MRN: [id number] Date of Birth: [birth year]  Subjective/Objective: Pt admitted for Chest pain and Gout Flare Up. Pt is from home with wife, However wife works. Pt is usually at home during these times alone. CM and CSW did speak with pt together in ref to disposition needs. As we walked in pt not to happy to see CM/CSW pt had eye rolling movements. CM did discuss options of SNF vs HH Services. Pt adamantly refusing SNF at this time, however he would greatly benefit from SNF since he is  limited with movement and his skin breakdown. CM did give the options of HHRN, PT, Aide and List of Personal Care Providers.  PCS services will be an out of pocket fee.                  Action/Plan: Pt never gave CM true definite answer for disposition because he stated his leg is not fixed. CM did make pt aware that Gout Flare Up may last a while. Pt continues to be on po Colchicine. Per MD pt is medically stable for d/c. CM did make pt aware that this CM would take a break from the conversation since pt could not come to a decision. CM did ask if wife would be able to be contacted and he stated no and that she would be up to visit. CM will continue to monitor for needs.     Expected Discharge Date:                  Expected Discharge Plan:  Home w Home Health Services  In-House Referral:     Discharge planning Services  CM Consult  Post Acute Care Choice:  Home Health Choice offered to:  Patient  DME Arranged:    DME Agency:     HH Arranged:   RN, PT, Aide [facility]  Status of Service: [facility] Medicare Important Message Given:    Date Medicare IM Given:    Medicare IM give by:    Date Additional Medicare IM Given:    Additional Medicare Important Message give by:     If discussed at Long Length of Stay Meetings, dates discussed:    Additional Comments:  1527 02-27-15 Tomi BambergerBrenda Graves-Bigelow, RN,[phone] CM did  speak with pt and plan is to d/c home in am via ambulance. CSW aware. [facility]. SOC to begin within 24-48 hrs post d/c. Orders need to be placed in EPIC and  F2F. CM did call [facility] in ref to DME transport chair. Pt may be at weigt capacity for chair- unable to get. Pt has regular wheelchair at home with RW. No further needs from CM at this time.   1435 02-27-15 Tomi BambergerBrenda Graves-Bigelow, RN,BSN [phone] CM did place call to physician in regards to disposition. Awaiting call back from MD.     Gala LewandowskyGraves-Bigelow, Alvino Lechuga Kaye, RN 02/27/2015, 10:21 AM

## 2015-02-27 NOTE — Clinical Social Work Note (Signed)
CSW and RNCM met with patient at bedside. Patient is still adamantly refusing SNF placement at this time. Patient insists that he will remain in the hospital until his gout flare up has resolved. CSW, RNCM, and MD have explained that this is not a reason for the patient to remain in the hospital. CSW signing off at this time as the patient will not agree to placement.   Liz Beach MSW, [place], [place], [phone]

## 2015-02-28 LAB — CULTURE, BODY FLUID W GRAM STAIN -BOTTLE: Culture: NO GROWTH

## 2015-02-28 LAB — CULTURE, BLOOD (ROUTINE X 2)
CULTURE: NO GROWTH
Culture: NO GROWTH

## 2015-02-28 LAB — BASIC METABOLIC PANEL
ANION GAP: 16 — AB (ref 5–15)
BUN: 16 mg/dL (ref 6–20)
CHLORIDE: 93 mmol/L — AB (ref 101–111)
CO2: 27 mmol/L (ref 22–32)
Calcium: 9.9 mg/dL (ref 8.9–10.3)
Creatinine, Ser: 0.92 mg/dL (ref 0.61–1.24)
Glucose, Bld: 200 mg/dL — ABNORMAL HIGH (ref 65–99)
POTASSIUM: 3.7 mmol/L (ref 3.5–5.1)
SODIUM: 136 mmol/L (ref 135–145)

## 2015-02-28 LAB — GLUCOSE, CAPILLARY: GLUCOSE-CAPILLARY: 183 mg/dL — AB (ref 65–99)

## 2015-02-28 LAB — CBC
HCT: 39.7 % (ref 39.0–52.0)
HEMOGLOBIN: 12.7 g/dL — AB (ref 13.0–17.0)
MCH: 27 pg (ref 26.0–34.0)
MCHC: 32 g/dL (ref 30.0–36.0)
MCV: 84.3 fL (ref 78.0–100.0)
PLATELETS: 353 10*3/uL (ref 150–400)
RBC: 4.71 MIL/uL (ref 4.22–5.81)
RDW: 14.1 % (ref 11.5–15.5)
WBC: 11.6 10*3/uL — AB (ref 4.0–10.5)

## 2015-02-28 MED ORDER — COLCHICINE 0.6 MG PO TABS: 0.6000 mg | ORAL_TABLET | Freq: Every day | ORAL | Status: DC

## 2015-02-28 NOTE — Clinical Social Work Note (Signed)
Per MD patient ready for DC home. RN and patient/family notified of DC home. Address confirmed by patient/family. DC packet on chart. Ambulance transport requested for 10:30AM pickup, wife will be at home to receive the patient. CSW signing off at this time.     Roddie McBryant Semaje Kinker MSW, [place], [place], [phone]

## 2015-02-28 NOTE — Progress Notes (Signed)
Family Medicine Teaching Service Daily Progress Note Intern Pager: [phone]  Patient name: [name] Medical record number: [id number] Date of birth: 07-Apr-1946 Age: 69 y.o. Gender: male  Primary Care Provider: [facility] Consultants: Cardiology Code Status: Full Code    Pt Overview and Major Events to Date:  10/21: Admit for gout of right knee, chest pain 10/21 - 10/26: NSTEMI > pt. Opted for medical management. Stable for d/c home.   Assessment and Plan:  [name] is a 69 y.o. male presenting with right knee pain x4-5 days and chest pain . 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  Pt. With NSTEMI this hospitalization opting for medical management.   - IV Heparin has been stopped - Cards recs: nothing else to do at this point. Stable for d/c home.  - Ambulate today, PT following; recommended SNF.  - Pt. Refusing SNF > home with HH, PT, and Aide.  - Pt on Plavix and Lopressor - Home today.   CHF-   Metolazone at home. Diuresed 3 L last 24 hours. Net negative 6L  No increasing SOB, CTAB. No O2 requirement.  - Continue home metolazone and Imdur - Home O2 qhs.   Gout in right knee:  Joint aspirates in ED consistent with Gout with monosodium urate crystals present. Not infectious by joint aspirate and cultures. Hx of gout. Uric acid 8.4.   - PT recommended SNF yesterday, patient is refusing and wants to go home. continue PT  - Patient refused Prednisone, continue Colchicine - Tylenol, dilaudid PRN pain - DC Lisinopril for potential to increase uric acid. Consider switching to Losartan.   - Continue Colchicine at discharge and further outpatient management.   Venous Stasis- RLE dopplers neg  for DVT, final read pending. 12/29/2014 had b/l dopplers which were sig for venous insufficency, neg for DVT. s/p multiple [facility]. No signs of infection. - wound care consult. Pt refuses recommended 4 layer compression but agrees to antibacterial non adherent, gauze and kerlix.   HTN- Blood pressure stable  - Continue home lopressor   DM Takes NPH 35-40 units qD, with sliding scale, no A1c in our system -  mod SSI - continue home NPH 35, titrate as needed - ACBG ACHS  HLD- 10/22 Lipid Panel; Cholesterol 214, LDL 167, HDL 28. Given h/o mulitiple coronary events would start statin, but has previously not been tolerated by pt - No statin at this time as patient not agreeable  CKD- Cr 0.93 today. Baseline near 1. - Continue to follow Cr  FEN/GI: Heart/ healthy /Carb modified diet, SLIV Prophylaxis: Heparin ppx Disposition: SNF vs home  Subjective:  Patient ready for discharge home. No acute events overnight. He says he will talk with his PCP about Colchicine at discharge. He will follow up with cardiology. Denies chest pain, SOB, overnight. Says his knee is feeling better. He is complimentary of the staff this morning.   Objective: Temp:  [97.7 F (36.5 C)-98 F (36.7 C)] 97.8 F (36.6 C) (10/26 0359) Pulse Rate:  [68-74] 72 (10/26 0359) Resp:  [18] 18 (10/26 0359) BP: (125-146)/(48-80) 136/70 mmHg (10/26 0359) SpO2:  [95 %] 95 % (10/26 0359) Physical Exam:  General: NAD, Resting in his bed lying flat.  Cardiovascular: RRR, no new MGR.  Respiratory: CTAB, appropriate rate, unlabored.  Abdomen: S, NT,ND, +BS.  Extremities: Bandages wrapped around lower extremeties. C/d/i changed 10/24.  Skin: venous stasis dermatitis of lower extremities. Wrapped in bandages as above with feet protruding.  Neuro: No focal deficits at this time.    Laboratory:  Recent Labs Lab 02/26/15 0150 02/27/15 0424 02/28/15 0315  WBC 5.7 8.9 11.6*  HGB 12.2*  11.4* 12.7*  HCT 34.7* 35.7* 39.7  PLT 177 335 353    Recent Labs Lab 02/23/15 1220 02/24/15 0646  02/26/15 0150 02/27/15 0424 02/28/15 0315  NA 134* 132*  < > 132* 140 136  K 3.6 3.2*  < > 4.1 3.8 3.7  CL 97* 89*  < > 95* 95* 93*  CO2 28 32  < > 27 30 27   BUN 19 19  < > 34* 18 16  CREATININE 1.01 1.15  < > 1.03 0.93 0.92  CALCIUM 8.7* 9.0  < > 9.0 9.6 9.9  PROT 6.2* 6.6  --   --   --   --   BILITOT 1.2 0.8  --   --   --   --   ALKPHOS 99 128*  --   --   --   --   ALT 19 30  --   --   --   --   AST 21 34  --   --   --   --   GLUCOSE 164* 192*  < > 239* 211* 200*  < > = values in this interval not displayed.  Intake/Output Summary (Last 24 hours) at 02/28/15 0734 Last data filed at 02/28/15 0624  Gross per 24 hour  Intake      3 ml  Output   3001 ml  Net  -2998 ml   CBG (last 3)   Recent Labs  02/27/15 1156 02/27/15 1641 02/27/15 2108  GLUCAP 195* 194* 175*     Imaging/Diagnostic Tests: CXR Portable-   IMPRESSION: Mild left basilar scarring/atelectasis.  No evidence of acute cardiopulmonary disease.  Yolande Jollyaleb G Evlyn Amason, MD 02/28/2015, 7:34 AM PGY-2, [place] [facility] FPTS Intern pager: [phone], text pages welcome

## 2015-02-28 NOTE — Discharge Instructions (Signed)
Gout Gout is an inflammatory arthritis caused by a buildup of uric acid crystals in the joints. Uric acid is a chemical that is normally present in the blood. When the level of uric acid in the blood is too high it can form crystals that deposit in your joints and tissues. This causes joint redness, soreness, and swelling (inflammation). Repeat attacks are common. Over time, uric acid crystals can form into masses (tophi) near a joint, destroying bone and causing disfigurement. Gout is treatable and often preventable. CAUSES  The disease begins with elevated levels of uric acid in the blood. Uric acid is produced by your body when it breaks down a naturally found substance called purines. Certain foods you eat, such as meats and fish, contain high amounts of purines. Causes of an elevated uric acid level include:  Being passed down from parent to child (heredity).  Diseases that cause increased uric acid production (such as obesity, psoriasis, and certain cancers).  Excessive alcohol use.  Diet, especially diets rich in meat and seafood.  Medicines, including certain cancer-fighting medicines (chemotherapy), water pills (diuretics), and aspirin.  Chronic kidney disease. The kidneys are no longer able to remove uric acid well.  Problems with metabolism. Conditions strongly associated with gout include:  Obesity.  High blood pressure.  High cholesterol.  Diabetes. Not everyone with elevated uric acid levels gets gout. It is not understood why some people get gout and others do not. Surgery, joint injury, and eating too much of certain foods are some of the factors that can lead to gout attacks. SYMPTOMS   An attack of gout comes on quickly. It causes intense pain with redness, swelling, and warmth in a joint.  Fever can occur.  Often, only one joint is involved. Certain joints are more commonly involved:  Base of the big toe.  Knee.  Ankle.  Wrist.  Finger. Without  treatment, an attack usually goes away in a few days to weeks. Between attacks, you usually will not have symptoms, which is different from many other forms of arthritis. DIAGNOSIS  Your caregiver will suspect gout based on your symptoms and exam. In some cases, tests may be recommended. The tests may include:  Blood tests.  Urine tests.  X-rays.  Joint fluid exam. This exam requires a needle to remove fluid from the joint (arthrocentesis). Using a microscope, gout is confirmed when uric acid crystals are seen in the joint fluid. TREATMENT  There are two phases to gout treatment: treating the sudden onset (acute) attack and preventing attacks (prophylaxis).  Treatment of an Acute Attack.  Medicines are used. These include anti-inflammatory medicines or steroid medicines.  An injection of steroid medicine into the affected joint is sometimes necessary.  The painful joint is rested. Movement can worsen the arthritis.  You may use warm or cold treatments on painful joints, depending which works best for you.  Treatment to Prevent Attacks.  If you suffer from frequent gout attacks, your caregiver may advise preventive medicine. These medicines are started after the acute attack subsides. These medicines either help your kidneys eliminate uric acid from your body or decrease your uric acid production. You may need to stay on these medicines for a very long time.  The early phase of treatment with preventive medicine can be associated with an increase in acute gout attacks. For this reason, during the first few months of treatment, your caregiver may also advise you to take medicines usually used for acute gout treatment. Be sure you  understand your caregiver's directions. Your caregiver may make several adjustments to your medicine dose before these medicines are effective.  Discuss dietary treatment with your caregiver or dietitian. Alcohol and drinks high in sugar and fructose and foods  such as meat, poultry, and seafood can increase uric acid levels. Your caregiver or dietitian can advise you on drinks and foods that should be limited. HOME CARE INSTRUCTIONS   Do not take aspirin to relieve pain. This raises uric acid levels.  Only take over-the-counter or prescription medicines for pain, discomfort, or fever as directed by your caregiver.  Rest the joint as much as possible. When in bed, keep sheets and blankets off painful areas.  Keep the affected joint raised (elevated).  Apply warm or cold treatments to painful joints. Use of warm or cold treatments depends on which works best for you.  Use crutches if the painful joint is in your leg.  Drink enough fluids to keep your urine clear or pale yellow. This helps your body get rid of uric acid. Limit alcohol, sugary drinks, and fructose drinks.  Follow your dietary instructions. Pay careful attention to the amount of protein you eat. Your daily diet should emphasize fruits, vegetables, whole grains, and fat-free or low-fat milk products. Discuss the use of coffee, vitamin C, and cherries with your caregiver or dietitian. These may be helpful in lowering uric acid levels.  Maintain a healthy body weight. SEEK MEDICAL CARE IF:   You develop diarrhea, vomiting, or any side effects from medicines.  You do not feel better in 24 hours, or you are getting worse. SEEK IMMEDIATE MEDICAL CARE IF:   Your joint becomes suddenly more tender, and you have chills or a fever. MAKE SURE YOU:   Understand these instructions.  Will watch your condition.  Will get help right away if you are not doing well or get worse.   This information is not intended to replace advice given to you by your health care provider. Make sure you discuss any questions you have with your health care provider.   Document Released: 04/18/2000 Document Revised: 05/12/2014 Document Reviewed: 12/03/2011 Elsevier Interactive Patient Education [id number]  [employer]   Be sure to follow up with your Physicians as indicated.   You may take the Colchicine daily at discharge for your gout. Discuss with your PCP about when to stop this medication.   If you have any further symptoms, or if you feel worse when you get home, return to the ED.   Thanks for letting us take care of you.  Sincerely,  Devota Pacealeb Lamont Tant, MD Family Medicine - PGY 2

## 2015-02-28 NOTE — Progress Notes (Signed)
   Patient was cleared by Dr. Delton SeeNelson for discharge from a cardiac standpoint on 02/27/15. Internal medicine to discharge patient home today. I have arranged 2 week cardiac [facility]. Date and time is listed on his AVS.  [name], [name]

## 2015-03-15 ENCOUNTER — Ambulatory Visit: Payer: Medicare Other | Admitting: Physician Assistant

## 2015-04-19 ENCOUNTER — Telehealth: Payer: Self-pay | Admitting: Cardiology

## 2015-04-19 NOTE — Telephone Encounter (Signed)
No recent lab tests or procedures to report. Encounter closed.

## 2015-04-19 NOTE — Telephone Encounter (Signed)
°  New Prob   Pt states he received a call from our office a few minutes ago. States no VM was left for him. Pt already aware of his appointment on 12/20.

## 2015-04-24 ENCOUNTER — Ambulatory Visit (INDEPENDENT_AMBULATORY_CARE_PROVIDER_SITE_OTHER): Payer: Medicare Other | Admitting: Physician Assistant

## 2015-04-24 ENCOUNTER — Encounter: Payer: Self-pay | Admitting: Physician Assistant

## 2015-04-24 VITALS — BP 132/66 | HR 64 | Ht 69.0 in | Wt 265.0 lb

## 2015-04-24 DIAGNOSIS — L97929 Non-pressure chronic ulcer of unspecified part of left lower leg with unspecified severity: Principal | ICD-10-CM

## 2015-04-24 DIAGNOSIS — I5033 Acute on chronic diastolic (congestive) heart failure: Secondary | ICD-10-CM | POA: Diagnosis not present

## 2015-04-24 DIAGNOSIS — I83029 Varicose veins of left lower extremity with ulcer of unspecified site: Principal | ICD-10-CM

## 2015-04-24 DIAGNOSIS — I83019 Varicose veins of right lower extremity with ulcer of unspecified site: Secondary | ICD-10-CM

## 2015-04-24 DIAGNOSIS — I872 Venous insufficiency (chronic) (peripheral): Secondary | ICD-10-CM

## 2015-04-24 DIAGNOSIS — I209 Angina pectoris, unspecified: Secondary | ICD-10-CM

## 2015-04-24 DIAGNOSIS — Z79899 Other long term (current) drug therapy: Secondary | ICD-10-CM | POA: Diagnosis not present

## 2015-04-24 DIAGNOSIS — I208 Other forms of angina pectoris: Secondary | ICD-10-CM

## 2015-04-24 DIAGNOSIS — L97919 Non-pressure chronic ulcer of unspecified part of right lower leg with unspecified severity: Principal | ICD-10-CM

## 2015-04-24 LAB — BASIC METABOLIC PANEL
BUN: 22 mg/dL (ref 7–25)
CHLORIDE: 99 mmol/L (ref 98–110)
CO2: 26 mmol/L (ref 20–31)
Calcium: 9.2 mg/dL (ref 8.6–10.3)
Creat: 1 mg/dL (ref 0.70–1.18)
Glucose, Bld: 184 mg/dL — ABNORMAL HIGH (ref 65–99)
POTASSIUM: 4.3 mmol/L (ref 3.5–5.3)
SODIUM: 135 mmol/L (ref 135–146)

## 2015-04-24 MED ORDER — BUMETANIDE 1 MG PO TABS: 1.0000 mg | ORAL_TABLET | Freq: Every day | ORAL | Status: DC

## 2015-04-24 NOTE — Progress Notes (Signed)
Cardiology Office Note   Date:  04/24/2015   ID:  [name], DOB 1946/02/09, MRN [id number]  PCP:  [facility]  Cardiologist:  Dr Lauraine Rinne, PA-C   Chief Complaint  Patient presents with  . Hospitalization Follow-up    no chest pain, shortness of breath-bad, with every moment, edema-little, no cramping in legs, has pain in legs,  lightheaded and dizziness    History of Present Illness: [name] is a 69 y.o. male with a history of CABGx2 with multiple PCIs, G3DD (echo 07/2009), coronary artery stent placement 2013, Pulm HTN ( home O2 req), DM2, CKD, HTN, HLD, OA, venous stasis with chronic ulcers.  Admitted 10/21-10/26 with gout and chest pain. NSTEMI by ez, EF 60-65% by echo, pt refused cath.  [name] presents for Posthospital follow-up   He as 3 different areas of struggling right now.   1.  musculoskeletal issues: he is struggling with gout right now and has significant musculoskeletal pain. He is not able to drive, cannot do any steps, and is almost to the point that he needs a wheelchair around the house. He is currently struggling with a walker. He is on colchicine for this. He moves around very little at home.    2. Chest pain: when he struggles with the musculoskeletal pain, he also gets chest pain. He will take 1 or 2 SL NTG and the pain will resolve. His usual angina. He feels the symptoms are fairly well controlled by the current medical therapy. He takes 15 mg Imdur 3 x day; in am, supper and bedtime.  3. Edema: he has venous stasis ulcers that have been chronic for a long time. He does not currently have any treatment of them except the wraps he does himself. They drain daily. He does not feel his feet are swollen in the morning but become swollen immediately when he puts them down. He has chronic orthopnea and possibly PND, but this is not clear. He does not weigh himself daily. Eating Habits are poor   Past  Medical History  Diagnosis Date  . Myocardial infarction [facility]) 1995    "total of ~ 15; 3 landed me in the hospital"; 1995 MI, 2008 NSTEMI, 08/2009 NSTEMI  . Chronic Stable Angina     Chronic -- Likely combination of small vessel disease as well as grade 3 diastolic dysfunction;; Not many PCI options left: Medical Management  . SOB (shortness of breath) on exertion     "w/angina, when lying down"  . CAD (coronary artery disease), native coronary artery 1995    Initial CABG 1985, redo CABG 2008; as of 2008 - Native RCA & LM 100%  . 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   . Diastolic dysfunction with chronic heart failure (HCC) 2011    Grade 2-3 Diastolic dysfunction; Echo in 4098 - Grade 3 DD with Mod LVH; b. Echo 02/2012: EF 50-55%, Mild-Mod Conc LVH > Gr 2 DD (by images), Mild Ao Sclerosis.  . Pulmonary HTN, with nighttime oxygen 06/25/2011    "at night when I sleep & sometimes during the day; for my angina"  . S/P coronary artery stent placement 08/2009, 03/2010, 06/2011    a. 4/'11: UA - PCI to prox SVG-OM: Promus 3.0 mm x 28  mm;; b. 11/'11: NSTEMI: Guideliner assited 2 site PCI SVG-OM: distal - 2.75 mm x 12 mm (3.43mm), mid 3.0 mm x 28 mm (3.5 mm);; c. 06/25/11: Class IV Angina - dSVG-OM 90% ISR @ 1st anastomosis => PCI 90% to 0% with Promus DES 2.75 mm x 12 mm (3.0) [Z95.5]  . Contrast dye induced nephropathy, history of in setting of sepsis and dehydration 06/25/2011    History of  . CKD (chronic kidney disease) stage 2, GFR 60-89 ml/min 06/25/2011  . Essential hypertension 06/25/2011  . Dyslipidemia, with allery to statins 06/25/2011    Also intolerant of fenofibrate  . Chronic cutaneous venous stasis ulcer (HCC)     Bilateral Chronic venous stasis ulcers making it difficult to wear compression stockings.  .  Obesity hypoventilation syndrome (HCC)   . Carotid artery plaque     CAROTID DOPPLER, 07/29/2012 - R ICA-0-49% diameter reduction, L Bulb/Proximal ICA-50-69% diameter reduction  . 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 Bilateral diabetic retinopathy (HCC)   . History of acute renal failure 12/2010    S/P "cillin and/or dye from CT scan"  . Osteoarthritis of both knees     Osteoarthritis of both knees, at times he must use a wheelchair. [M17.0]  . GERD (gastroesophageal reflux disease)     Past Surgical History  Procedure Laterality Date  . Coronary angioplasty with stent placement  April 2011    Distal SVG to OM -- Promus DES 3.0 mm 23 mm  . Tonsillectomy and adenoidectomy      "as a kid"  . Appendectomy      "as a kid"  . Coronary angioplasty with stent placement  03/08/2010    Repeat PCI space--complex procedure using Guideliner: 2 site PCI SVG to OM1-OM 2: 2.75 mm 12 mm Promus DDS distally, 3.5 mm 20 mm proximally.  . Coronary angioplasty with stent placement  06/25/2011    PCI to the very distal segment/anastomotic lesion of the SVG-OM1-OM 2. 2.75 mm 12 mm probes DES stent OM1 all but occluded/atretic. OM 2 widely patent.  . Coronary artery bypass graft  1996    CABG X4; LIMA-LAD, SVG-diagonal, SVG-OM1-L2, SVG-distal RCA.  Marland Kitchen Coronary artery bypass graft  2008    CABG X2; free radial from the would've SVG-OM 2 distal OM, and redo SVG-RCA -- both grafts occluded several months later. Time down for third redo CABG  . Cardiac catheterization  08/10/2009    Distal SVG 90% stenosis, stented with a 3.0x56mm Promus stent resulting in brisk TIMI III flow  . Cardiac catheterization  10/02/2006    Medical management  . Cardiac catheterization  05/13/2006    Recommended CABG  . Cardiac catheterization  06/06/1994    Severe native CAD, widely patent SVG grafts, normal LV systolic function  . Nuclear stress test  01/21/2002    Resting ECG demonstrates normal sinus  rhythm, stress ECG non-diagnostic for ischemia]  . 2d echocardiogram  03/04/2012    EF 50-55%, normal-mild  . Left heart catheterization with coronary/graft angiogram  06/25/2011    Procedure: LEFT HEART CATHETERIZATION WITH Isabel Caprice;  Surgeon: Marykay Lex, MD;  Location: [facility] C[facility] LAB;  Service: Cardiovascular;;    Current Outpatient Prescriptions  Medication Sig Dispense Refill  . B Complex Vitamins (B COMPLEX PO) Take 2 capsules by mouth daily.     Marland Kitchen CINNAMON PO Take 1,000 mg by mouth 2 (two) times daily. With chrondtin    . clopidogrel (PLAVIX) 75  MG tablet Take 75 mg by mouth at bedtime.     . Coenzyme Q10 (COQ10) 200 MG CAPS Take 200 mg by mouth 2 (two) times daily.     . colchicine 0.6 MG tablet Take 1 tablet (0.6 mg total) by mouth daily. 30 tablet 3  . diclofenac sodium (VOLTAREN) 1 % GEL Apply 2 g topically 4 (four) times daily as needed. (Patient taking differently: Apply 2 g topically 4 (four) times daily as needed (pain, inflammation). ) 1 Tube 1  . Ergocalciferol (VITAMIN D2) 2000 UNITS TABS Take 6,000 mg by mouth daily.    . Grape Seed 50 MG CAPS Take 50 mg by mouth daily.    Marland Kitchen Hyaluronic Acid-Vitamin C (HYALURONIC ACID PO) Take 100 mg by mouth daily.     . insulin aspart (NOVOLOG) 100 UNIT/ML injection Inject into the skin See admin instructions. 5 grams carbs = 1 unit,  11 gram protein = 1 unit,    10 points over 100 cbg = 1 unit    . insulin NPH (HUMULIN N,NOVOLIN N) 100 UNIT/ML injection Inject 35-40 Units into the skin daily.     . isosorbide mononitrate (IMDUR) 30 MG 24 hr tablet Take 15 mg by mouth 3 (three) times daily.     . Magnesium Oxide 250 MG TABS Take 500 mg by mouth daily.     . metolazone (ZAROXOLYN) 5 MG tablet Take 5 mg by mouth daily. For fluid    . metoprolol (LOPRESSOR) 50 MG tablet Take 1 tablet (50 mg total) by mouth 3 (three) times daily. (Patient taking differently: Take 25-50 mg by mouth 3 (three) times daily. Take 50 mg in the  morning, 25 in the afternoon, and 50 mg at bedtime) 90 tablet 5  . Misc Natural Products (BLACK CHERRY CONCENTRATE PO) Take by mouth.    . nitroGLYCERIN (NITROSTAT) 0.4 MG SL tablet Place 0.4 mg under the tongue every 5 (five) minutes as needed. For chest pain    . potassium chloride SA (K-DUR,KLOR-CON) 20 MEQ tablet Take 30-40 mEq by mouth daily. Take 40 meq 3 times a week,and 30 meq 4 times aweek    . Saw Palmetto, Serenoa repens, 450 MG CAPS Take 450 mg by mouth daily.    Marland Kitchen torsemide (DEMADEX) 100 MG tablet Take 50-100 mg by mouth daily. Take 1-2 tabs daily     No current facility-administered medications for this visit.    Allergies:   Ioxaglate; Ivp dye; Statins; Ranexa; Adhesive; Penicillins; and Tears again    Social History:  The patient  reports that he quit smoking about 30 years ago. He has never used smokeless tobacco. He reports that he drinks alcohol. He reports that he does not use illicit drugs.   Family History:  The patient's family history is not on file.    ROS:  Please see the history of present illness. All other systems are reviewed and negative.    PHYSICAL EXAM: VS:  BP 132/66 mmHg  Pulse 64  Ht 5\' 9"  (1.753 m)  Wt 265 lb (120.203 kg)  BMI 39.12 kg/m2 , BMI Body mass index is 39.12 kg/(m^2). GEN: Well nourished, well developed, male in no acute distress HEENT: normal for age  Neck: JVD difficult to assess secondary to body habitus, no carotid bruit, no masses Cardiac: RRR; no murmur, no rubs, or gallops Respiratory:  Decreased breath sounds bases bilaterally, normal work of breathing GI: soft, nontender, nondistended, + BS MS: no deformity or atrophy; 2-3 plus bilateral  lower extremity edema; distal pulses are 2+ in upper extremities; decreased in lower extremities due to edema. Capillary refill is normal in lower extremities. Erythema is also seen. There is serous drainage on the dressings but these were not disturbed because we would not be able to replace  them. Skin: warm and dry, no rash Neuro:  Strength and sensation are intact Psych: euthymic mood, full affect   EKG:  EKG is not ordered today.  Recent Labs: 02/23/2015: B Natriuretic Peptide 236.8* 02/24/2015: ALT 30; TSH 2.127 02/28/2015: BUN 16; Creatinine, Ser 0.92; Hemoglobin 12.7*; Platelets 353; Potassium 3.7; Sodium 136    Lipid Panel    Component Value Date/Time   CHOL 214* 02/24/2015 0646   TRIG 97 02/24/2015 0646   HDL 28* 02/24/2015 0646   CHOLHDL 7.6 02/24/2015 0646   VLDL 19 02/24/2015 0646   LDLCALC 167* 02/24/2015 0646     Wt Readings from Last 3 Encounters:  04/24/15 265 lb (120.203 kg)  12/01/14 287 lb 9 oz (130.437 kg)  06/09/14 274 lb 4.8 oz (124.422 kg)     Other studies Reviewed: Additional studies/ records that were reviewed today include: previous office notes and hospital records.  ASSESSMENT AND PLAN:  1.  Chest pain: He has a history of CAD that is being medically managed. Cardiac catheterization was again discussed with the patient. He is clear that until he is convinced that there is a possibility they can do something about what they find, he does not wish to have another heart catheterization. He feels his angina is at an acceptable baseline right now. He was offered the opportunity to increase his Imdur to before meals and at bedtime or add a low-dose of amlodipine which is less desirable. He refuses both of these. Continue current therapy with Plavix, beta blocker and nitrates.  2. Volume overload: He does not feel the Demadex is very effective and so he only takes it for blisters. He has been taking the Zaroxolyn daily. He has not had the Demadex and long time. He is significantly volume overloaded and he has significantly more edema than he did during his October hospitalization. His weight is lower, but his volume status is increased. He does not feel Lasix is effective and he does not feel Demadex is effective. We will try Bumex 1 mg daily.  He is to take the metolazone 30 minutes before the Bumex. We will check a BMET today and have the home health RN to 1 and a week.  3. Venous stasis ulcers: We'll try to get a home health nurse out to change the dressings 3 times a week. We will try to get him a referral to [facility]. He was seen by the wound care nurse during his last hospitalization. He has not felt the previous therapies were successful, we will continue dressing changes only for now. He has significant mobility issues currently and has problems with transportation. For now, I believe he qualifies for home health assistance.  **Greater than one hour was spent with this patient and his wife**   Current medicines are reviewed at length with the patient today.  The patient does not have concerns regarding medicines.  The following changes have been made:  no change  Labs/ tests ordered today include:   Orders Placed This Encounter  Procedures  . Basic metabolic panel  . Basic metabolic panel  . AMB referral to wound care center  . Ambulatory referral to Home Health  Disposition:   FU with Dr. Herbie Baltimore  Signed, Leanna Battles  04/24/2015 4:45 PM    [facility] [address] [place], [place], [place]  [address] Phone: [phone]; Fax: [phone]

## 2015-04-24 NOTE — Patient Instructions (Addendum)
You have been referred to the W[facility] Center at [facility].  We have also referred you to Home Health for wound care.  Medication Instructions:  START Bumex 1 mg - take 1 tablet by mouth daily 30 minutes after metolazone.  >>A new prescription has been sent to your pharmacy electronically.  Labwork: Your physician recommends that you return for lab work TODAY.  Your physician recommends that you return for lab work in 1 week - have home health draw this lab.  Testing/Procedures: NONE  Follow-Up: Douglas Demarkhonda Barrett, PA-C, recommends that you schedule a follow-up appointment first available with Dr Herbie BaltimoreHarding.  If you need a refill on your cardiac medications before your next appointment, please call your pharmacy.

## 2015-04-26 NOTE — Progress Notes (Signed)
God Bless you [name] -- this is always a tough visit.  [name], Douglas Yankee W, MD

## 2015-05-31 ENCOUNTER — Encounter (HOSPITAL_BASED_OUTPATIENT_CLINIC_OR_DEPARTMENT_OTHER): Payer: Medicare Other | Attending: Internal Medicine

## 2015-05-31 ENCOUNTER — Other Ambulatory Visit (HOSPITAL_COMMUNITY)
Admission: RE | Admit: 2015-05-31 | Discharge: 2015-05-31 | Disposition: A | Discharge status: Home / Self Care | Payer: Medicare Other | Source: Other Acute Inpatient Hospital | Attending: Physician Assistant | Admitting: Physician Assistant

## 2015-05-31 DIAGNOSIS — I87333 Chronic venous hypertension (idiopathic) with ulcer and inflammation of bilateral lower extremity: Secondary | ICD-10-CM | POA: Insufficient documentation

## 2015-05-31 DIAGNOSIS — L97821 Non-pressure chronic ulcer of other part of left lower leg limited to breakdown of skin: Secondary | ICD-10-CM | POA: Insufficient documentation

## 2015-05-31 DIAGNOSIS — I2510 Atherosclerotic heart disease of native coronary artery without angina pectoris: Secondary | ICD-10-CM | POA: Diagnosis not present

## 2015-05-31 DIAGNOSIS — I872 Venous insufficiency (chronic) (peripheral): Secondary | ICD-10-CM | POA: Insufficient documentation

## 2015-05-31 DIAGNOSIS — I5030 Unspecified diastolic (congestive) heart failure: Secondary | ICD-10-CM | POA: Diagnosis not present

## 2015-05-31 DIAGNOSIS — I252 Old myocardial infarction: Secondary | ICD-10-CM | POA: Diagnosis not present

## 2015-05-31 DIAGNOSIS — Z955 Presence of coronary angioplasty implant and graft: Secondary | ICD-10-CM | POA: Diagnosis not present

## 2015-05-31 DIAGNOSIS — E11622 Type 2 diabetes mellitus with other skin ulcer: Secondary | ICD-10-CM | POA: Insufficient documentation

## 2015-05-31 DIAGNOSIS — M1990 Unspecified osteoarthritis, unspecified site: Secondary | ICD-10-CM | POA: Diagnosis not present

## 2015-05-31 DIAGNOSIS — L97811 Non-pressure chronic ulcer of other part of right lower leg limited to breakdown of skin: Secondary | ICD-10-CM | POA: Insufficient documentation

## 2015-05-31 DIAGNOSIS — I272 Other secondary pulmonary hypertension: Secondary | ICD-10-CM | POA: Diagnosis not present

## 2015-05-31 DIAGNOSIS — Z79899 Other long term (current) drug therapy: Secondary | ICD-10-CM | POA: Insufficient documentation

## 2015-05-31 DIAGNOSIS — Z951 Presence of aortocoronary bypass graft: Secondary | ICD-10-CM | POA: Insufficient documentation

## 2015-05-31 DIAGNOSIS — I110 Hypertensive heart disease with heart failure: Secondary | ICD-10-CM | POA: Diagnosis not present

## 2015-05-31 DIAGNOSIS — G4730 Sleep apnea, unspecified: Secondary | ICD-10-CM | POA: Diagnosis not present

## 2015-05-31 LAB — BASIC METABOLIC PANEL
ANION GAP: 15 (ref 5–15)
BUN: 27 mg/dL — AB (ref 6–20)
CALCIUM: 9.8 mg/dL (ref 8.9–10.3)
CO2: 29 mmol/L (ref 22–32)
Chloride: 95 mmol/L — ABNORMAL LOW (ref 101–111)
Creatinine, Ser: 1.36 mg/dL — ABNORMAL HIGH (ref 0.61–1.24)
GFR calc Af Amer: 59 mL/min — ABNORMAL LOW (ref >60)
GFR, EST NON AFRICAN AMERICAN: 51 mL/min — AB (ref >60)
GLUCOSE: 188 mg/dL — AB (ref 65–99)
POTASSIUM: 3.5 mmol/L (ref 3.5–5.1)
SODIUM: 139 mmol/L (ref 135–145)

## 2015-06-20 ENCOUNTER — Other Ambulatory Visit: Payer: Self-pay | Admitting: Internal Medicine

## 2015-06-20 ENCOUNTER — Ambulatory Visit (HOSPITAL_COMMUNITY)
Admission: RE | Admit: 2015-06-20 | Discharge: 2015-06-20 | Disposition: A | Discharge status: Home / Self Care | Payer: Medicare Other | Source: Ambulatory Visit | Attending: Vascular Surgery | Admitting: Vascular Surgery

## 2015-06-20 DIAGNOSIS — E1122 Type 2 diabetes mellitus with diabetic chronic kidney disease: Secondary | ICD-10-CM | POA: Insufficient documentation

## 2015-06-20 DIAGNOSIS — L97929 Non-pressure chronic ulcer of unspecified part of left lower leg with unspecified severity: Secondary | ICD-10-CM | POA: Insufficient documentation

## 2015-06-20 DIAGNOSIS — R938 Abnormal findings on diagnostic imaging of other specified body structures: Secondary | ICD-10-CM | POA: Insufficient documentation

## 2015-06-20 DIAGNOSIS — Z87891 Personal history of nicotine dependence: Secondary | ICD-10-CM | POA: Diagnosis not present

## 2015-06-20 DIAGNOSIS — I130 Hypertensive heart and chronic kidney disease with heart failure and stage 1 through stage 4 chronic kidney disease, or unspecified chronic kidney disease: Secondary | ICD-10-CM | POA: Diagnosis not present

## 2015-06-20 DIAGNOSIS — E119 Type 2 diabetes mellitus without complications: Secondary | ICD-10-CM | POA: Insufficient documentation

## 2015-06-20 DIAGNOSIS — E1165 Type 2 diabetes mellitus with hyperglycemia: Secondary | ICD-10-CM | POA: Diagnosis not present

## 2015-06-20 DIAGNOSIS — N182 Chronic kidney disease, stage 2 (mild): Secondary | ICD-10-CM | POA: Insufficient documentation

## 2015-06-20 DIAGNOSIS — E11319 Type 2 diabetes mellitus with unspecified diabetic retinopathy without macular edema: Secondary | ICD-10-CM | POA: Diagnosis not present

## 2015-06-20 DIAGNOSIS — L97919 Non-pressure chronic ulcer of unspecified part of right lower leg with unspecified severity: Secondary | ICD-10-CM | POA: Insufficient documentation

## 2015-06-20 DIAGNOSIS — I5030 Unspecified diastolic (congestive) heart failure: Secondary | ICD-10-CM | POA: Insufficient documentation

## 2015-06-28 ENCOUNTER — Telehealth: Payer: Self-pay

## 2015-06-28 ENCOUNTER — Encounter: Payer: Self-pay | Admitting: Cardiology

## 2015-06-28 ENCOUNTER — Ambulatory Visit (INDEPENDENT_AMBULATORY_CARE_PROVIDER_SITE_OTHER): Payer: Medicare Other | Admitting: Cardiology

## 2015-06-28 VITALS — BP 132/70 | HR 62 | Ht 69.0 in | Wt 285.9 lb

## 2015-06-28 DIAGNOSIS — I272 Other secondary pulmonary hypertension: Secondary | ICD-10-CM

## 2015-06-28 DIAGNOSIS — E785 Hyperlipidemia, unspecified: Secondary | ICD-10-CM

## 2015-06-28 DIAGNOSIS — I5032 Chronic diastolic (congestive) heart failure: Secondary | ICD-10-CM

## 2015-06-28 DIAGNOSIS — I25709 Atherosclerosis of coronary artery bypass graft(s), unspecified, with unspecified angina pectoris: Secondary | ICD-10-CM | POA: Diagnosis not present

## 2015-06-28 DIAGNOSIS — I83009 Varicose veins of unspecified lower extremity with ulcer of unspecified site: Secondary | ICD-10-CM

## 2015-06-28 DIAGNOSIS — I5033 Acute on chronic diastolic (congestive) heart failure: Secondary | ICD-10-CM

## 2015-06-28 DIAGNOSIS — I209 Angina pectoris, unspecified: Secondary | ICD-10-CM | POA: Diagnosis not present

## 2015-06-28 DIAGNOSIS — L97909 Non-pressure chronic ulcer of unspecified part of unspecified lower leg with unspecified severity: Secondary | ICD-10-CM

## 2015-06-28 LAB — BASIC METABOLIC PANEL
BUN: 35 mg/dL — AB (ref 7–25)
CHLORIDE: 99 mmol/L (ref 98–110)
CO2: 26 mmol/L (ref 20–31)
Calcium: 9.1 mg/dL (ref 8.6–10.3)
Creat: 1.38 mg/dL — ABNORMAL HIGH (ref 0.70–1.18)
GLUCOSE: 119 mg/dL — AB (ref 65–99)
POTASSIUM: 3.7 mmol/L (ref 3.5–5.3)
Sodium: 140 mmol/L (ref 135–146)

## 2015-06-28 NOTE — Telephone Encounter (Signed)
OK - labs look stable.  Renal function is not where we would like it, but stable.  DH

## 2015-06-28 NOTE — Patient Instructions (Signed)
Take  1/2 tablet of Bumex  Continue with Zaroxolyn dose  Your net output of urine should be 200- 500 more than what you take in a day.  Call office when you decide to have a cardiac cath   Your physician wants you to follow-up in 6 month with Dr Herbie Baltimore-  30 MIN  You will receive a reminder letter in the mail two months in advance. If you don't receive a letter, please call our office to schedule the follow-up appointment.   If you need a refill on your cardiac medications before your next appointment, please call your pharmacy.

## 2015-06-28 NOTE — Telephone Encounter (Signed)
Jessica from [facility] called to inform that STAT BMET results are resulted.   To Dr. Herbie Baltimore for review.

## 2015-06-28 NOTE — Progress Notes (Signed)
PCP: [facility]  Clinic Note: Chief Complaint  Patient presents with  . Follow-up    per Theodore Demark, PA-C//pt c/o chest pain  . Chest Pain    Angina  . Coronary Artery Disease  . Leg Swelling    HPI: [name] is a 70 y.o. male with a PMH below who presents today for ~ 6 month /fu of chronic cardiac disease -- CAD/CABG/PCI, CHF.  [name] Ball was last seen by me in July 2016, but I did round on him while in the hospital in October.  Recent Hospitalizations: Oct 2016: Gouty pain, CHF exacerbation with + Troponin/NSTEMI --> he declined cardiac cath citing that he really did not have any anginal discomfort at that time..  Studies Reviewed:   Echo 02/2015: Normal LV cavity size. Moderate LVH. EF 60-65%. Mild aortic annular calcification. Mild to moderate MR. Mild LA dilation.  Recently seen by Leanna Battles for post-hospital f/u --> He as 3 different areas of struggling right now.   1. musculoskeletal issues: -- Mostly related to gout and arthritis. Unable to drive and really even walk around much. Pretty much limited to 2-3 rooms in the house. Uses colchicine. Needs a wheelchair outside of house. Poor quality of life 2. Chest pain: when he struggles with the musculoskeletal pain, he also gets chest pain. He will take 1 or 2 SL NTG and the pain will resolve. His usual angina. He feels the symptoms are fairly well controlled by the current medical therapy. He takes 15 mg Imdur 3 x day; in am, supper and bedtime.  3. Edema: &  venous stasis ulcers that have been chronic for a long time. He does not currently have any treatment of them except the wraps he does himself. They drain daily. He does not feel his feet are swollen in the morning but become swollen immediately when he puts them down. He has chronic orthopnea and possibly PND, but this is not clear. He does not weigh himself daily. Eating Habits are poor  ?? Not sure of wound care @ home -  had been going to [place] [facility].  Volume overloaded - Demedex not helpful. -> using Xaroyolyn.  Wgt lower, but volume up. --> converted to Bumex 1mg  daily with Max Fickle.      Chemistry      Component Value Date/Time   NA 139 05/31/2015 1700   K 3.5 05/31/2015 1700   CL 95* 05/31/2015 1700   CO2 29 05/31/2015 1700   BUN 27* 05/31/2015 1700   CREATININE 1.36* 05/31/2015 1700   CREATININE 1.00 04/24/2015 1545      Component Value Date/Time   CALCIUM 9.8 05/31/2015 1700   ALKPHOS 128* 02/24/2015 0646   AST 34 02/24/2015 0646   ALT 30 02/24/2015 0646   BILITOT 0.8 02/24/2015 0646      Interval History: I'm seeing Davell now for the first time since his hospital stay. He saw a Bjorn Loser a month or so ago. What strikes me as the most notable complaint he has is his musculoskeletal issues. However he does have a significant angina and is pretty much limited as much from his musculoskeletal symptoms as he is by his exertional angina and dyspnea. 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 He had ABIs checked in January that were relatively stable for because of these ulcers. Apparently is no longer taking colchicine not sure why. [facility]  He still  has the same 3 complaints are noted to [place]. He thinks that his lower legs are getting worse than it had been before. Partly because he is no longer having wound care management. When he was using the UnaBoot wraps, his ulcers were  well-controlled He is concerned because it he doesn't really no wide Bumex is working well for him anymore. When he takes his Zaroxolyn he has great urine output, but we will with increased dose of Demadex is not had that. He has not been taking his loop diuretic. He switched from [facility] to Bumex. But when he takes his this combination with Zaroxolyn he has brisk 6 L urine output. However without Zaroxolyn he has minimal output. With Zaroxolyn alone his urine output is better than  with Demadex or Bumex alone. As for his anginal pain. He is now taking more nitroglycerin than he had in the past. For the longest time he said this is relatively well-controlled on his Imdur 3 times a day, but now is getting worse. Sure if this combination of heartburn and gas type symptoms or just angina because he has sometimes when he has a lot of angina without heartburn but often times has associated with heartburn. He now gets angina just simply walking down all in his house. At least class III if not class IV symptoms. He still sleeps mostly in a chair and will note PND if he lies flat. In addition to his lower 70 edema he notes having increased abdominal girth as well. Otherwise he denies any sensation of any palpitations or rapid/irregular heartbeats. He does get somewhat lightheaded and dizzy with rapid standing or movements. He had been on lisinopril and noted that his blood pressures just got too low, so therefore is not taking that anymore.  No syncope/near syncope.No TIA/amaurosis fugax symptoms. No claudication - mostly because he doesn't walk much.  ROS: A comprehensive was performed. Review of Systems  Constitutional: Positive for malaise/fatigue. Negative for fever, chills and weight loss.  HENT: Positive for congestion. Negative for nosebleeds.   Respiratory: Positive for shortness of breath. Negative for cough and wheezing.   Cardiovascular: Positive for orthopnea, leg swelling and PND.  Gastrointestinal: Positive for heartburn, nausea and diarrhea (frequent loose stool). Negative for blood in stool and melena.  Genitourinary: Negative for hematuria.  Musculoskeletal: Positive for myalgias and joint pain. Negative for falls.  Neurological: Positive for dizziness. Negative for headaches.  Endo/Heme/Allergies: Bruises/bleeds easily.  All other systems reviewed and are negative.   Past Medical History  Diagnosis Date  . Myocardial infarction [facility]) 1995    "total of ~ 15; 3  landed me in the hospital"; 1995 MI, 2008 NSTEMI, 08/2009 NSTEMI  . Chronic Stable Angina     Chronic -- Likely combination of small vessel disease as well as grade 3 diastolic dysfunction;; Not many PCI options left: Medical Management  . SOB (shortness of breath) on exertion     "w/angina, when lying down"  . CAD (coronary artery disease), native coronary artery 1995    Initial CABG 1985, redo CABG 2008; as of 2008 - Native RCA & LM 100%  . 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   . Diastolic dysfunction with chronic heart failure (HCC) 2011    Grade 2-3 Diastolic dysfunction; Echo in 1610 - Grade 3 DD with Mod  LVH; b. Echo 02/2012: EF 50-55%, Mild-Mod Conc LVH > Gr 2 DD (by images), Mild Ao Sclerosis.  . Pulmonary HTN, with nighttime oxygen 06/25/2011    "at night when I sleep & sometimes during the day; for my angina"  . S/P coronary artery stent placement 08/2009, 03/2010, 06/2011    a. 4/'11: UA - PCI to prox SVG-OM: Promus 3.0 mm x 28 mm;; b. 11/'11: NSTEMI: Guideliner assited 2 site PCI SVG-OM: distal - 2.75 mm x 12 mm (3.38mm), mid 3.0 mm x 28 mm (3.5 mm);; c. 06/25/11: Class IV Angina - dSVG-OM 90% ISR @ 1st anastomosis => PCI 90% to 0% with Promus DES 2.75 mm x 12 mm (3.0) [Z95.5]  . Contrast dye induced nephropathy, history of in setting of sepsis and dehydration 06/25/2011    History of  . CKD (chronic kidney disease) stage 2, GFR 60-89 ml/min 06/25/2011  . Essential hypertension 06/25/2011  . Dyslipidemia, with allery to statins 06/25/2011    Also intolerant of fenofibrate  . Chronic cutaneous venous stasis ulcer (HCC)     Bilateral Chronic venous stasis ulcers making it difficult to wear compression stockings.  . Obesity hypoventilation syndrome (HCC)   . Carotid artery plaque     CAROTID DOPPLER,  07/29/2012 - R ICA-0-49% diameter reduction, L Bulb/Proximal ICA-50-69% diameter reduction  . 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 Bilateral diabetic retinopathy (HCC)   . History of acute renal failure 12/2010    S/P "cillin and/or dye from CT scan"  . Osteoarthritis of both knees     Osteoarthritis of both knees, at times he must use a wheelchair. [M17.0]  . GERD (gastroesophageal reflux disease)     Past Surgical History  Procedure Laterality Date  . Coronary angioplasty with stent placement  April 2011    Distal SVG to OM -- Promus DES 3.0 mm 23 mm  . Tonsillectomy and adenoidectomy      "as a kid"  . Appendectomy      "as a kid"  . Coronary angioplasty with stent placement  03/08/2010    Repeat PCI space--complex procedure using Guideliner: 2 site PCI SVG to OM1-OM 2: 2.75 mm 12 mm Promus DDS distally, 3.5 mm 20 mm proximally.  . Coronary angioplasty with stent placement  06/25/2011    PCI to the very distal segment/anastomotic lesion of the SVG-OM1-OM 2. 2.75 mm 12 mm probes DES stent OM1 all but occluded/atretic. OM 2 widely patent.  . Coronary artery bypass graft  1996    CABG X4; LIMA-LAD, SVG-diagonal, SVG-OM1-L2, SVG-distal RCA.  Marland Kitchen Coronary artery bypass graft  2008    CABG X2; free radial from the would've SVG-OM 2 distal OM, and redo SVG-RCA -- both grafts occluded several months later. Time down for third redo CABG  . Cardiac catheterization  08/10/2009    Distal SVG 90% stenosis, stented with a 3.0x92mm Promus stent resulting in brisk TIMI III flow  . Cardiac catheterization  10/02/2006    Medical management  . Cardiac catheterization  05/13/2006    Recommended CABG  . Cardiac catheterization  06/06/1994    Severe native CAD, widely patent SVG grafts, normal LV systolic function  . Nuclear stress test  01/21/2002    Resting ECG demonstrates normal sinus rhythm, stress ECG non-diagnostic for ischemia]  . 2d echocardiogram  03/04/2012     EF 50-55%, normal-mild  . Left heart catheterization with coronary/graft angiogram  06/25/2011    Procedure: LEFT HEART  CATHETERIZATION WITH Isabel Caprice;  Surgeon: Marykay Lex, MD;  Location: [facility] CATH LAB;  Service: Cardiovascular;;     Allergies  Allergen Reactions  . Ioxaglate Other (See Comments)    "found out in August 2012; I'm allergic to dye used in CT scans; causes lots of edema"  . Ivp Dye [Iodinated Diagnostic Agents] Other (See Comments)    "found out in August 2012; I'm allergic to dye used in CT scans; causes lots of edema"  . Statins Other (See Comments)    Muscle ache; "rhabdomyolysis"  . Ranexa [Ranolazine]     Jittery,as well as constipation and bleeding hemorrhoids.  . Adhesive [Tape] Itching and Rash  . Penicillins Swelling and Rash    Has patient had a PCN reaction causing immediate rash, facial/tongue/throat swelling, SOB or lightheadedness with hypotension: No Has patient had a PCN reaction causing severe rash involving mucus membranes or skin necrosis: No Has patient had a PCN reaction that required hospitalization No Has patient had a PCN reaction occurring within the last 10 years: No If all of the above answers are "NO", then may proceed with Cephalosporin use.  . Tears Again [Artificial Tears] Rash and Other (See Comments)    Burning eyes and skin around eyes    Social History   Social History  . Marital Status: Married    Spouse Name: N/A  . Number of Children: N/A  . Years of Education: N/A   Social History Main Topics  . Smoking status: Former Smoker -- 2.00 packs/day    Quit date: 03/19/1985  . Smokeless tobacco: Never Used  . Alcohol Use: Yes     Comment: 06/25/11 "rarely have a drink"  . Drug Use: No  . Sexual Activity: No   Other Topics Concern  . None   Social History Narrative   Usually he comes in with a wheelchair. He is very happy walking today. Does not get much exercise due to that. He is hoping to do his water  aerobics this summer.      Is a married father 9, grandfather to. The previous he started doing Silver sneakers but was really bothered by his knees. He been doing very well with weight loss but has altered somewhat with that..      Primary care provider is Dr. Mardi Mainland, [facility].  Phone number: [phone]    History reviewed. No pertinent family history.   Wt Readings from Last 3 Encounters:  06/28/15 285 lb 14.4 oz (129.683 kg)  04/24/15 265 lb (120.203 kg)  12/01/14 287 lb 9 oz (130.437 kg)  December read is erroneous - [facility].  PHYSICAL EXAM BP 132/70 mmHg  Pulse 62  Ht 5\' 9"  (1.753 m)  Wt 285 lb 14.4 oz (129.683 kg)  BMI 42.20 kg/m2 General appearance: alert, cooperative, appears stated age, morbidly obese and Normal mood and affect.  Neck: JVD - 1 cm above sternal notch, no adenopathy, Left greater than right carotid bruit  HEENT: Wears glasses, [place]/AT, EOMI, MMM, anicteric sclera. Mild right eyelid cholesteatoma  Lungs: Mostly CTAB with mild diminished sounds; no W./R./R. Normal percussion bilaterally and Nonlabored, just diminished throughout due to body habitus.  Heart: RRR, S1, S2 normal but distant, S4 present; Unable to palpate apical impulse . No other murmurs or rubs and noted to be significant.  Abdomen: Grossly obese, unable to palpate any HSM. Unable to determine edema or ascites.  Extremities: both legs with significant edema and multiple  different bandages for different ulcers of various levels of healing.  Pulses: Diminished low short pulses of the 2/2 edema.  Skin: Brawny/purple hemosiderin staining, venous stasis changes. See extremities above  Neurologic: Mental status: Alert, oriented, thought content appropriate, affect: mood-congruent     Adult ECG Report  Rate: 62 ;  Rhythm: normal sinus rhythm and Borderline left axis (-26) incomplete right bundle branch block. 1 AV block (212 ms PR interval); most  notably there is significant ST depression with T wave inversions in leads V3 through V6 as well as Ball and aVL with T-wave inversions noted in I, aVL, V1 and V2. -- These are new changes. Cannot exclude ischemia.  Narrative Interpretation: Abnormal EKG. Cannot exclude extensive ischemia based on this EKG.   Other studies Reviewed: Additional studies/ records that were reviewed today include:  Recent Labs:   Lab Results  Component Value Date   CHOL 214* 02/24/2015   HDL 28* 02/24/2015   LDLCALC 167* 02/24/2015   TRIG 97 02/24/2015   CHOLHDL 7.6 02/24/2015     ASSESSMENT / PLAN: Problem List Items Addressed This Visit    Venous stasis ulcers (HCC) (Chronic)    These usually act up when he has worsening edema. Not sure where his wound care went to. He needs to have a wound care clinic. This was being done through the [place]. He did well on UNA BOOT therapy -- I wonder if maybe he can get back into either podiatry or a wound care either to the [place] or through [facility].      Pulmonary HTN, with nighttime oxygen (Chronic)    He uses nighttime oxygen still. I'm sure he has OSA with hypoventilation syndrome. Tried to talk about OSA treatment in the past. For now please continue home oxygen. We'll also ensure adequate diuresis.      Morbid obesity - with likely obesity hypoventilation syndrome (Chronic)    His weight is not quite up to 300 pounds within before, but it is in the mid 280s. Unfortunately, I really don't see any way for her to lose weight besides just simply going on a crash diet. She is pretty much limited to his office, bathroom and bedroom if not out for clinic visits. We could benefit from having some type of transport mechanism such as a scooter he has been trying to get one through the [place], but has not been happy with the potential options.      Dyslipidemia, with intol to statins (Chronic)    He is now seeing an endocrinologist. Hopefully this can be managed by the [facility]  endocrinologist. I really think he would benefit from PCSK9 inhibitor.      Chronic diastolic congestive heart failure, NYHA class 3 (HCC) (Chronic)    This is hard to manage. His weights seem to be stable at home. The diuresis portion is very difficult to figure out. What I think we will try to do is have him use the Zaroxolyn with a half dose Bumex. The goal would be for him to be in net out roughly 500 more than per day then he is in for ins and outs. If this is not effective, would switch the other way using half dose Zaroxolyn plus full dose Bumex. He is on Toprol and Imdur.  Not on ARB       CAD: CABG '95 with redo '08- s/p multiple PCIs since (Chronic)    Unfortunately is very difficult to treat. Refuses to even try to take statins.  Has not followed through with plans to consider PCSK9 inhibitor. He is on Plavix, beta blocker and Imdur. Medication intolerance makes it very difficult to manage.  Every time I try to adjust medications, he simply goes back to what he was doing before. At this point we'll continue with current management and defer to him is the decision about going back to the Cath Lab.      Angina, class III (HCC)    Consistently worse angina with minimal exertion now. At least class III if not class IV. Also now in the setting of an abnormal EKG. He had a non-ST elevation MI whether it was a demand infarction versus a true ACS non-STEMI remains unclear. He does not have a lot of revascularization options left open. However I am pretty concerned about EKG and his worsening symptoms. We talked for a long time about the decision not to go to the Cath Lab back when he is a hospital as well as now. It is all about his quality of life. He has never tolerated any increase in his Imdur dose would not do that. I can give him Ranexa. He is self adjusted his metoprolol dose to where he is taking it currently. At this point I don't have any medical options to treat him any further than he  does himself.  I pretty much left bone his court as to whether or not he would want proceed with invasive procedures. Because of the has any significant exacerbation of symptoms to which she would then decide he would be willing to go for cardiac catheterization. I'm worried that he may be showing down the 1 remaining vein graft. This would then leave him only with the LIMA graft.      Acute on chronic diastolic CHF (congestive heart failure), NYHA class 3 (HCC) - Primary    I do think he is volume overloaded on exam with abdominal girth increase and edema. Hopefully the, a slim Zaroxolyn &  Bumex but at half dose will be helpful in maintaining his borderline net negative         Current medicines are reviewed at length with the patient today. (+/- concerns) not sure how to do his diuretic The following changes have been made:  Take  1/2 tablet of Bumex  Continue with Zaroxolyn dose  Your net output of urine should be 200- 500 more than what you take in a day.  Call office when you decide to have a cardiac cath   Your physician wants you to follow-up in 6 month with Dr Herbie Baltimore-  30 MIN   Close to 1 hour was spent with the patient personally in addition to 20 minutes with the nurse assistant reviewing his medications. Greater than 50% of the visit was in counseling as far as options for medical management of his heart failure symptoms and edema as well as the decision of whether or not potentially go to the cardiac catheterization lab.   Studies Ordered:   Orders Placed This Encounter  Procedures  . EKG 12-Lead      Marykay Lex, M.D., M.S. Interventional Cardiologist   Pager # [phone] Phone # [phone] [address] [place], [place] [address]

## 2015-07-11 ENCOUNTER — Encounter: Payer: Self-pay | Admitting: Cardiology

## 2015-07-11 DIAGNOSIS — I209 Angina pectoris, unspecified: Secondary | ICD-10-CM | POA: Insufficient documentation

## 2015-07-11 NOTE — Assessment & Plan Note (Signed)
Consistently worse angina with minimal exertion now. At least class III if not class IV. Also now in the setting of an abnormal EKG. He had a non-ST elevation MI whether it was a demand infarction versus a true ACS non-STEMI remains unclear. He does not have a lot of revascularization options left open. However I am pretty concerned about EKG and his worsening symptoms. We talked for a long time about the decision not to go to the Cath Lab back when he is a hospital as well as now. It is all about his quality of life. He has never tolerated any increase in his Imdur dose would not do that. I can give him Ranexa. He is self adjusted his metoprolol dose to where he is taking it currently. At this point I don't have any medical options to treat him any further than he does himself.  I pretty much left bone his court as to whether or not he would want proceed with invasive procedures. Because of the has any significant exacerbation of symptoms to which she would then decide he would be willing to go for cardiac catheterization. I'm worried that he may be showing down the 1 remaining vein graft. This would then leave him only with the LIMA graft.

## 2015-07-11 NOTE — Assessment & Plan Note (Signed)
I do think he is volume overloaded on exam with abdominal girth increase and edema. Hopefully the, a slim Zaroxolyn &  Bumex but at half dose will be helpful in maintaining his borderline net negative

## 2015-07-11 NOTE — Assessment & Plan Note (Signed)
He uses nighttime oxygen still. I'm sure he has OSA with hypoventilation syndrome. Tried to talk about OSA treatment in the past. For now please continue home oxygen. We'll also ensure adequate diuresis.

## 2015-07-11 NOTE — Assessment & Plan Note (Signed)
He is now seeing an endocrinologist. [facility]. I really think he would benefit from PCSK9 inhibitor.

## 2015-07-11 NOTE — Assessment & Plan Note (Signed)
His weight is not quite up to 300 pounds within before, but it is in the mid 280s. Unfortunately, I really don't see any way for her to lose weight besides just simply going on a crash diet. She is pretty much limited to his office, bathroom and bedroom if not out for clinic visits. We could benefit from having some type of transport mechanism such as a scooter he has been trying to get one [facility] [place], but has not been happy with the potential options.

## 2015-07-11 NOTE — Assessment & Plan Note (Signed)
This is hard to manage. His weights seem to be stable at home. The diuresis portion is very difficult to figure out. What I think we will try to do is have him use the Zaroxolyn with a half dose Bumex. The goal would be for him to be in net out roughly 500 more than per day then he is in for ins and outs. If this is not effective, would switch the other way using half dose Zaroxolyn plus full dose Bumex. He is on Toprol and Imdur.  Not on ARB

## 2015-07-11 NOTE — Assessment & Plan Note (Signed)
Unfortunately is very difficult to treat. Refuses to even try to take statins. Has not followed through with plans to consider PCSK9 inhibitor. He is on Plavix, beta blocker and Imdur. Medication intolerance makes it very difficult to manage.  Every time I try to adjust medications, he simply goes back to what he was doing before. At this point we'll continue with current management and defer to him is the decision about going back to the Cath Lab.

## 2015-07-11 NOTE — Assessment & Plan Note (Signed)
These usually act up when he has worsening edema. Not sure where his wound care went to. He needs to have a wound care clinic. This was being done [facility] [place]. He did well on UNA BOOT therapy -- I wonder if maybe he can get back into either podiatry or a wound care either to [facility] or through [facility].

## 2016-01-18 ENCOUNTER — Encounter: Payer: Self-pay | Admitting: Cardiology

## 2016-01-18 ENCOUNTER — Ambulatory Visit (INDEPENDENT_AMBULATORY_CARE_PROVIDER_SITE_OTHER): Payer: Medicare Other | Admitting: Cardiology

## 2016-01-18 VITALS — BP 124/60 | HR 56 | Ht 69.0 in | Wt 285.0 lb

## 2016-01-18 DIAGNOSIS — I83009 Varicose veins of unspecified lower extremity with ulcer of unspecified site: Secondary | ICD-10-CM

## 2016-01-18 DIAGNOSIS — I9589 Other hypotension: Secondary | ICD-10-CM

## 2016-01-18 DIAGNOSIS — I272 Other secondary pulmonary hypertension: Secondary | ICD-10-CM | POA: Diagnosis not present

## 2016-01-18 DIAGNOSIS — I25708 Atherosclerosis of coronary artery bypass graft(s), unspecified, with other forms of angina pectoris: Secondary | ICD-10-CM

## 2016-01-18 DIAGNOSIS — I5032 Chronic diastolic (congestive) heart failure: Secondary | ICD-10-CM | POA: Diagnosis not present

## 2016-01-18 DIAGNOSIS — Z955 Presence of coronary angioplasty implant and graft: Secondary | ICD-10-CM

## 2016-01-18 DIAGNOSIS — I209 Angina pectoris, unspecified: Secondary | ICD-10-CM | POA: Diagnosis not present

## 2016-01-18 DIAGNOSIS — I208 Other forms of angina pectoris: Secondary | ICD-10-CM | POA: Diagnosis not present

## 2016-01-18 DIAGNOSIS — E785 Hyperlipidemia, unspecified: Secondary | ICD-10-CM

## 2016-01-18 DIAGNOSIS — I10 Essential (primary) hypertension: Secondary | ICD-10-CM | POA: Diagnosis not present

## 2016-01-18 DIAGNOSIS — R600 Localized edema: Secondary | ICD-10-CM

## 2016-01-18 DIAGNOSIS — L97909 Non-pressure chronic ulcer of unspecified part of unspecified lower leg with unspecified severity: Secondary | ICD-10-CM

## 2016-01-18 MED ORDER — METOPROLOL TARTRATE 50 MG PO TABS: 25.0000 mg | ORAL_TABLET | Freq: Three times a day (TID) | ORAL | 3 refills | Status: DC

## 2016-01-18 NOTE — Patient Instructions (Addendum)
DECREASE METOPROLOL TART. TO 25 MG  IN THE MORNING, AND 25 MG IN LUNCH AND 50 MG DINNER.   DECIDE ON ONE DAY  A WEEK -  AND DO NOT TAKE DIURETIC THAT DAY.   Your physician wants you to follow-up in: 6 MONTHS WITH DR HARDING.-30 MIN You will receive a reminder letter in the mail two months in advance. If you don't receive a letter, please call our office to schedule the follow-up appointment.     If you need a refill on your cardiac medications before your next appointment, please call your pharmacy.

## 2016-01-18 NOTE — Progress Notes (Signed)
PCP: [facility]  Clinic Note: Chief Complaint  Patient presents with  . Follow-up    6 MONTHS; See problem list below.   1. Stable angina, class 2-3 chronic - it is usually occurring at night but also with moderate exertion   2. Pulmonary HTN, with nighttime oxygen   3. Essential hypertension   4. Bilateral leg edema   5. 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 Chronic diastolic congestive heart failure, NYHA class 3 (HCC)   7. Hypotension, iatrogenic   8. Dyslipidemia, with intol to statins   9. Status post coronary artery stent placement, 06/25/11 in distal SVG to OM for instent restenosis 90% to 0% with Promus DES   10. Morbid obesity due to excess calories (HCC)   11. Venous stasis ulcers, unspecified laterality (HCC)     HPI: [name] is a 70 y.o. male with a PMH below who presents today for Six-month follow-up for his multiple cardiac issues including CAD-CABG and multiple PCIS, CHF, chronic venous insufficiency/venous hypertension with persistent stasis ulcers. [name] spends a lot of time during his visits talking about multiple issues that he feels like he needs to that reference his lower extremity care and other non-necessarily cardiac issues. It is very difficult to keep him on task. She [name] was last seen acting in February 2017. At that time, he was extremely volume overloaded and required significant diuresis. Recent Hospitalizations: None  Studies Reviewed:  February 2017: Are ABI 0.84 indicating mild occlusive disease. LABI 1.01. R&L TBI <0.69,   Venous Wound Care: Chronic Venous HTN w/ ulcer - Bilateral.  DM Ulcer  Interval History: [name] presents today stating that his weight at home was about 260 pounds which is notably improved and normal spectral his baseline weight. He really thinks that his swelling is pretty much as good as it has been. Currently has  his feet in Ace wrap with bandages. He says that his ulcers are doing better. He really says his angina has been almost nonexistent over the last couple months, not having to take nitroglycerin as routinely. He says he is only taking it may be 9 times since I last saw him. He still has orthopnea and PND with angina decubitus if he lies down flat, he therefore sleeps in a chair. He really is inactive this simply sending [place] this time from one chair to another chair and barely does any activity. He actually has had some episodes recently of having hypotension with bradycardia, and a segment upon himself to the morning dose of metoprolol 25 mg. Orthostatic was also just noted in the morning his pressure significantly lower than usual. He denies any rapid irregular heartbeats or palpitations. No syncope or near syncope despite having his low blood pressures.   No syncope/near syncope. No TIA/amaurosis fugax symptoms. No melena, hematochezia, hematuria, or epstaxis. No claudication.  ROS: A comprehensive was performed. Review of Systems  Constitutional: Positive for malaise/fatigue (No energy).  HENT: Negative for nosebleeds.   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 He also bilateral cataracts. He is under the care of Dr. Deidre Ala.  Right now he can barely see.  Respiratory: Positive for shortness of breath (Pretty much at his baseline.). Negative for cough and wheezing.   Cardiovascular: Positive for chest pain, orthopnea, leg swelling (Relatively stable) and PND. Negative  for palpitations.  Gastrointestinal: Positive for constipation (Very constipated with aggressive diuresis.).  Genitourinary: Negative for hematuria.  Musculoskeletal: Positive for joint pain and myalgias (Somewhat improved. He still has some cramping). Negative for falls.  Neurological: Positive for dizziness (Due to hypotension episodes). Negative for focal weakness, weakness and  headaches.  Endo/Heme/Allergies: Bruises/bleeds easily.  Psychiatric/Behavioral: Negative for depression and memory loss. The patient has insomnia. The patient is not nervous/anxious.   All other systems reviewed and are negative.   Past Medical History:  Diagnosis Date  . Bilateral diabetic retinopathy (HCC)   . CAD (coronary artery disease), autologous vein bypass graft 06/2011   From 1995 CABG - LIMA-LAD patent, SVG-OM1-2 patent (with PCI on 3 separate occasions), SVG-RCA & SVG-D1 occluded; from 2008: fRad-OM 100%, SVG-RCA 100%.   Marland Kitchen CAD (coronary artery disease), native coronary artery 1995   Initial CABG 1985, redo CABG 2008; as of 2008 - Native RCA & LM 100%  . Carotid artery plaque    CAROTID DOPPLER, 07/29/2012 - R ICA-0-49% diameter reduction, L Bulb/Proximal ICA-50-69% diameter reduction  . Chronic cutaneous venous stasis ulcer (HCC)    Bilateral Chronic venous stasis ulcers making it difficult to wear compression stockings.  . Chronic Stable Angina    Chronic -- Likely combination of small vessel disease as well as grade 3 diastolic dysfunction;; Not many PCI options left: Medical Management  . CKD (chronic kidney disease) stage 2, GFR 60-89 ml/min 06/25/2011  . Contrast dye induced nephropathy, history of in setting of sepsis and dehydration 06/25/2011   History of  . Diastolic dysfunction with chronic heart failure (HCC) 2011   Grade 2-3 Diastolic dysfunction; Echo in 1610 - Grade 3 DD with Mod LVH; b. Echo 02/2012: EF 50-55%, Mild-Mod Conc LVH > Gr 2 DD (by images), Mild Ao Sclerosis.  . 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 Dyslipidemia, with allery to statins 06/25/2011   Also intolerant of fenofibrate  . Essential hypertension 06/25/2011  . GERD (gastroesophageal reflux disease)   . History of acute renal failure 12/2010   S/P "cillin and/or dye from CT scan"  . Myocardial infarction [facility]) 1995   "total of ~ 15; 3 landed me in the  hospital"; 1995 MI, 2008 NSTEMI, 08/2009 NSTEMI  . Obesity hypoventilation syndrome (HCC)   . Osteoarthritis of both knees    Osteoarthritis of both knees, at times he must use a wheelchair. [M17.0]  . Pulmonary HTN, with nighttime oxygen 06/25/2011   "at night when I sleep & sometimes during the day; for my angina"  . S/P coronary artery stent placement 08/2009, 03/2010, 06/2011   a. 4/'11: UA - PCI to prox SVG-OM: Promus 3.0 mm x 28 mm;; b. 11/'11: NSTEMI: Guideliner assited 2 site PCI SVG-OM: distal - 2.75 mm x 12 mm (3.38mm), mid 3.0 mm x 28 mm (3.5 mm);; c. 06/25/11: Class IV Angina - dSVG-OM 90% ISR @ 1st anastomosis => PCI 90% to 0% with Promus DES 2.75 mm x 12 mm (3.0) [Z95.5]  . SOB (shortness of breath) on exertion    "w/angina, when lying down"    Past Surgical History:  Procedure Laterality Date  . 2D ECHOCARDIOGRAM  03/04/2012   EF 50-55%, normal-mild  . APPENDECTOMY     "as a kid"  . CARDIAC CATHETERIZATION  08/10/2009   Distal SVG 90% stenosis, stented with a 3.0x30mm Promus stent resulting in brisk TIMI III flow  . CARDIAC CATHETERIZATION  10/02/2006   Medical management  .  CARDIAC CATHETERIZATION  05/13/2006   Recommended CABG  . CARDIAC CATHETERIZATION  06/06/1994   Severe native CAD, widely patent SVG grafts, normal LV systolic function  . CORONARY ANGIOPLASTY WITH STENT PLACEMENT  April 2011   Distal SVG to OM -- Promus DES 3.0 mm 23 mm  . CORONARY ANGIOPLASTY WITH STENT PLACEMENT  03/08/2010   Repeat PCI space--complex procedure using Guideliner: 2 site PCI SVG to OM1-OM 2: 2.75 mm 12 mm Promus DDS distally, 3.5 mm 20 mm proximally.  . CORONARY ANGIOPLASTY WITH STENT PLACEMENT  06/25/2011   PCI to the very distal segment/anastomotic lesion of the SVG-OM1-OM 2. 2.75 mm 12 mm probes DES stent OM1 all but occluded/atretic. OM 2 widely patent.  . CORONARY ARTERY BYPASS GRAFT  1996   CABG X4; LIMA-LAD, SVG-diagonal, SVG-OM1-L2, SVG-distal RCA.  Marland Kitchen CORONARY ARTERY BYPASS GRAFT  2008    CABG X2; free radial from the would've SVG-OM 2 distal OM, and redo SVG-RCA -- both grafts occluded several months later. Time down for third redo CABG  . LEFT HEART CATHETERIZATION WITH CORONARY/GRAFT ANGIOGRAM  06/25/2011   Procedure: LEFT HEART CATHETERIZATION WITH Isabel Caprice;  Surgeon: Marykay Lex, MD;  Location: [facility] CATH LAB;  Service: Cardiovascular;;  . NUCLEAR STRESS TEST  01/21/2002   Resting ECG demonstrates normal sinus rhythm, stress ECG non-diagnostic for ischemia]  . TONSILLECTOMY AND ADENOIDECTOMY     "as a kid"  . TRANSTHORACIC ECHOCARDIOGRAM  02/2015   EF 60-65% with moderate LVH. Mild to moderate MR. Mild LA dilation.    Prior to Admission medications   Medication Sig Start Date End Date Taking? Authorizing Provider  bumetanide (BUMEX) 1 MG tablet Take 1 tablet (1 mg total) by mouth daily. 30 minutes after Metolazone 04/24/15  Yes Rhonda G Barrett, PA-C  Cholecalciferol (VITAMIN D3) 2000 units TABS Take 6,000 Units by mouth daily.   Yes Historical Provider, MD  CINNAMON PO Take 1,000 mg by mouth 2 (two) times daily. With chrondtin   Yes Historical Provider, MD  clopidogrel (PLAVIX) 75 MG tablet Take 75 mg by mouth at bedtime.    Yes Historical Provider, MD  Coenzyme Q10 (COQ10) 200 MG CAPS Take 200 mg by mouth 2 (two) times daily.    Yes Historical Provider, MD  diclofenac sodium (VOLTAREN) 1 % GEL Apply 2 g topically 4 (four) times daily as needed. Patient taking differently: Apply 2 g topically 4 (four) times daily as needed (pain, inflammation).  02/11/13  Yes Luke K Kilroy, PA-C  Grape Seed 50 MG CAPS Take 50 mg by mouth daily.   Yes Historical Provider, MD  Hyaluronic Acid-Vitamin C (HYALURONIC ACID PO) Take 100 mg by mouth daily.    Yes Historical Provider, MD  insulin aspart (NOVOLOG) 100 UNIT/ML injection Inject into the skin See admin instructions. 5 grams carbs = 1 unit,  11 gram protein = 1 unit,    10 points over 100 cbg = 1 unit   Yes Historical  Provider, MD  insulin NPH (HUMULIN N,NOVOLIN N) 100 UNIT/ML injection Inject 35-40 Units into the skin daily.    Yes Historical Provider, MD  isosorbide mononitrate (IMDUR) 30 MG 24 hr tablet Take 15 mg by mouth 3 (three) times daily.    Yes Historical Provider, MD  Magnesium Oxide 250 MG TABS Take 500 mg by mouth daily.    Yes Historical Provider, MD  metolazone (ZAROXOLYN) 5 MG tablet Take 5 mg by mouth daily. For fluid   Yes Historical Provider, MD  metoprolol (LOPRESSOR) 50 MG tablet Take 0.5-1 tablets (25-50 mg total) by mouth 3 (three) times daily. Take 25 mg in the morning, 25 in the afternoon, and 50 mg at bedtime 01/18/16  Yes Marykay Lexavid W Kharon Hixon, MD  Misc Natural Products (BLACK CHERRY CONCENTRATE PO) Take by mouth.   Yes Historical Provider, MD  nitroGLYCERIN (NITROSTAT) 0.4 MG SL tablet Place 0.4 mg under the tongue every 5 (five) minutes as needed. For chest pain   Yes Historical Provider, MD  potassium chloride SA (K-DUR,KLOR-CON) 20 MEQ tablet Take 30-40 mEq by mouth daily. Take 40 meq 3 times a week,and 30 meq 4 times aweek   Yes Historical Provider, MD  Saw Palmetto, Serenoa repens, 450 MG CAPS Take 450 mg by mouth daily.   Yes Historical Provider, MD   Detailed Med Allergies Reviewed in Epic - multiple intolerances  Social History   Social History  . Marital status: Married    Spouse name: N/A  . Number of children: N/A  . Years of education: N/A   Social History Main Topics  . Smoking status: Former Smoker    Packs/day: 2.00    Quit date: 03/19/1985  . Smokeless tobacco: Never Used  . Alcohol use Yes     Comment: 06/25/11 "rarely have a drink"  . Drug use: No  . Sexual activity: No   Other Topics Concern  . None   Social History Narrative   Usually he comes in with a wheelchair. He is very happy walking today. Does not get much exercise due to that. He is hoping to do his water aerobics this summer.      Is a married father 564, grandfather to. The previous he started  doing Silver sneakers but was really bothered by his knees. He been doing very well with weight loss but has altered somewhat with that..      Primary care provider is Dr. Mardi Mainland[facility]ristine Keesler[facility].  Phone number: [phone]   History reviewed. No pertinent family history. Non-contributoruy  Wt Readings from Last 3 Encounters:  01/18/16 285 lb (129.3 kg)  06/28/15 285 lb 14.4 oz (129.7 kg)  04/24/15 265 lb (120.2 kg)   -- wgt 260 lb @ home   PHYSICAL EXAM BP 124/60   Pulse (!) 56   Ht 5\' 9"  (1.753 m)   Wt 285 lb (129.3 kg)   BMI 42.09 kg/m  General appearance: alert, cooperative, appears stated age, morbidly obese and Normal mood and affect.  Neck: JVD - 1 cm above sternal notch, no adenopathy, Left greater than right carotid bruit  HEENT: Wearing sunglasses, [place]/AT, EOMI, MMM, anicteric sclera. Mild right eyelid cholesteatoma  Lungs: Mostly CTAB with mild diminished sounds; no W./R./R. Normal percussion bilaterally and Nonlabored, just diminished throughout due to body habitus.  Heart: RRR, S1, S2 normal but distant, S4 present; Unable to palpate apical impulse . No other murmurs or rubs and noted to be significant.  Abdomen: Grossly obese, unable to palpate any HSM. Unable to determine edema or ascites.  Extremities:both legs are wrapped with ACE wraps.  Upper extremities - bruising Pulses: Diminished low short pulses of the 2/2 edema.  Skin: Brawny/purple hemosiderin staining, venous stasis changes. See extremities above  Neurologic: Mental status: Alert, oriented, thought content appropriate, affect: mood-congruent     Adult ECG Report  Rate: 56 ;  Rhythm: sinus bradycardia, sinus arrhythmia and incomplete RBBB; marked ST abnormality, possible endocardial injury;   Narrative Interpretation: Rate slow or but otherwise stable EKG.  Other studies Reviewed: Additional studies/ records that were reviewed today include:  Recent Labs:  Not available     ASSESSMENT / PLAN: Decrease metoprolol tartrate 25 mg the morning and consider 25 mg at lunch with 50 mg in the evening.  Problem List Items Addressed This Visit    Venous stasis ulcers (HCC) (Chronic)    Pretty much has chronic venous hypertension in his lower extremities. Currently he seemed to be relatively stable. He bounces between several different people managing this. Last place she went was of acceptable to him. He is doing his own dress this point. He spends quite a bit of his time talking about this.      Status post coronary artery stent placement, 06/25/11 in distal SVG to OM for instent restenosis 90% to 0% with Promus DES (Chronic)    Thankfully angina improving. Still on Plavix and beta blocker.      Stable angina, class 2chronic - it is usually occurring at night but also with moderate exertion - Primary (Chronic)    He has had improvement. On a relatively stable regimen with beta blocker and nitrate. Intolerant of medications.  Given his diastolic dysfunction mediated vascular ischemia down with diuresis remains crucial.      Relevant Medications   metoprolol (LOPRESSOR) 50 MG tablet   Other Relevant Orders   EKG 12-Lead   Pulmonary HTN, with nighttime oxygen (Chronic)    Clearly probably has OSA/hypoventilation syndrome. At least on home oxygen, I think he would probably be fine wearing the oxygen pulse sitting around at home and not just at night.  He probably does better with being more diuresis than less, to avoid pulmonary venous hypertension as well.      Relevant Medications   metoprolol (LOPRESSOR) 50 MG tablet   Other Relevant Orders   EKG 12-Lead   Morbid obesity - with likely obesity hypoventilation syndrome (Chronic)    Unfortunately, I don't see that he will be able to do much of any kind of exercise to lose weight. We therefore limited to only 2 dietary modification which has never been a good option. He states that he eats healthy, but just does  not lose weight.      Hypotension, iatrogenic    He is now had more issues with hypotension. Actually taken off his ACE inhibitor. We also tried to go to 50 twice a day of metoprolol, but he still been taking it 3 times a day. I think the plan of taking the morning dose of metoprolol is 25 mg even potentially the afternoon dose is 25 mg would probably be a good option. I still want to take 50 mg nighttime dose because of his angina decubitus.  He is set up for orthostatic hypotension because of his elevated pulmonary pressures and overdiuresis, but very difficult to determine his true volume status. Really need to use his daily weights - unfortunately he doesn't always check his weights.      Essential hypertension (Chronic)   Relevant Medications   metoprolol (LOPRESSOR) 50 MG tablet   Other Relevant Orders   EKG 12-Lead   Dyslipidemia, with intol to statins (Chronic)    Supposedly seeing an endocrinologist. I have not seen any advancement on therapy options. With all his other issues going on, I would prefer to not institute another medicine to his regimen.      Chronic diastolic congestive heart failure, NYHA class 3 (HCC) (Chronic)    Again his pulmonary hypertension and significant venous  stasis, started to [facility] diastolic heart failure versus just other reasons for edema. He does have chronic orthopnea which tells me he probably does have baseline diastolic heart failure. He still takes Zaroxolyn and Bumex as well as beta blocker and Imdur. At this point he is not on ARB, and with her having some hypotension episodes, this is not an option also with renal insufficiency. Continue current treatment with Bumex and Zaroxolyn, however with his now significant constipation, gas that one day week he doesn't take the combination of Zaroxolyn plus Bumex. He will try to see which one has a better effect either Zaroxolyn versus Bumex alone.      Relevant Medications   metoprolol (LOPRESSOR) 50 MG  tablet   CAD: CABG '95 with redo '08- s/p multiple PCIs since (Chronic)    Notably less angina over the last several months. This would make me wonder if maybe one of his distal vessels is occluded and therefore not having angina from it. He is on stable dose of 3 times a day Imdur. Did not tolerate Ranexa. Aspirin he is on 3 times a day metoprolol, which I think he may benefit reduce the dose on. He remains on Plavix. Statin intolerant.  His intolerance to medications makes managing his CAD difficult.      Relevant Medications   metoprolol (LOPRESSOR) 50 MG tablet   Bilateral leg edema   Relevant Orders   EKG 12-Lead    Other Visit Diagnoses   None.     Doc has multiple issues to discuss. He has a tendency to talk off topic making it very difficult to get to the key points of issues. As result greater than 40 minutes was spent with the patient trying to get through all of his issues. At least 50% of which was spent in direct medication counseling.  Current medicines are reviewed at length with the patient today. (+/- concerns) not sure about BB dosing etc.  Constipated  The following changes have been made:  DECREASE METOPROLOL TART. TO 25 MG  IN THE MORNING, AND 25 MG IN LUNCH AND 50 MG DINNER.   DECIDE ON ONE DAY  A WEEK -  AND DO NOT TAKE DIURETIC THAT DAY.   Your physician wants you to follow-up in: 6 MONTHS WITH DR Merelyn Klump.-30 MIN    Studies Ordered:   Orders Placed This Encounter  Procedures  . EKG 12-Lead      Bryan Lemma, M.D., M.S. Interventional Cardiologist   Pager # [phone] Phone # [phone] [address] [place], [place] [address]

## 2016-01-20 ENCOUNTER — Encounter: Payer: Self-pay | Admitting: Cardiology

## 2016-01-20 NOTE — Assessment & Plan Note (Signed)
Thankfully angina improving. Still on Plavix and beta blocker.

## 2016-01-20 NOTE — Assessment & Plan Note (Signed)
Pretty much has chronic venous hypertension in his lower extremities. Currently he seemed to be relatively stable. He bounces between several different people managing this. Last place she went was of acceptable to him. He is doing his own dress this point. He spends quite a bit of his time talking about this.

## 2016-01-20 NOTE — Assessment & Plan Note (Signed)
Notably less angina over the last several months. This would make me wonder if maybe one of his distal vessels is occluded and therefore not having angina from it. He is on stable dose of 3 times a day Imdur. Did not tolerate Ranexa. Aspirin he is on 3 times a day metoprolol, which I think he may benefit reduce the dose on. He remains on Plavix. Statin intolerant.  His intolerance to medications makes managing his CAD difficult.

## 2016-01-20 NOTE — Assessment & Plan Note (Signed)
He has had improvement. On a relatively stable regimen with beta blocker and nitrate. Intolerant of medications.  Given his diastolic dysfunction mediated vascular ischemia down with diuresis remains crucial.

## 2016-01-20 NOTE — Assessment & Plan Note (Signed)
Clearly probably has OSA/hypoventilation syndrome. At least on home oxygen, I think he would probably be fine wearing the oxygen pulse sitting around at home and not just at night.  He probably does better with being more diuresis than less, to avoid pulmonary venous hypertension as well.

## 2016-01-20 NOTE — Assessment & Plan Note (Signed)
He is now had more issues with hypotension. Actually taken off his ACE inhibitor. We also tried to go to 50 twice a day of metoprolol, but he still been taking it 3 times a day. I think the plan of taking the morning dose of metoprolol is 25 mg even potentially the afternoon dose is 25 mg would probably be a good option. I still want to take 50 mg nighttime dose because of his angina decubitus.  He is set up for orthostatic hypotension because of his elevated pulmonary pressures and overdiuresis, but very difficult to determine his true volume status. Really need to use his daily weights - unfortunately he doesn't always check his weights.

## 2016-01-20 NOTE — Assessment & Plan Note (Signed)
Supposedly seeing an endocrinologist. I have not seen any advancement on therapy options. With all his other issues going on, I would prefer to not institute another medicine to his regimen.

## 2016-01-20 NOTE — Assessment & Plan Note (Signed)
Again his pulmonary hypertension and significant venous stasis, started to [facility] diastolic heart failure versus just other reasons for edema. He does have chronic orthopnea which tells me he probably does have baseline diastolic heart failure. He still takes Zaroxolyn and Bumex as well as beta blocker and Imdur. At this point he is not on ARB, and with her having some hypotension episodes, this is not an option also with renal insufficiency. Continue current treatment with Bumex and Zaroxolyn, however with his now significant constipation, gas that one day week he doesn't take the combination of Zaroxolyn plus Bumex. He will try to see which one has a better effect either Zaroxolyn versus Bumex alone.

## 2016-01-20 NOTE — Assessment & Plan Note (Signed)
Unfortunately, I don't see that he will be able to do much of any kind of exercise to lose weight. We therefore limited to only 2 dietary modification which has never been a good option. He states that he eats healthy, but just does not lose weight.

## 2016-03-03 ENCOUNTER — Telehealth: Payer: Self-pay | Admitting: Cardiology

## 2016-03-03 MED ORDER — METOPROLOL TARTRATE 50 MG PO TABS: 25.0000 mg | ORAL_TABLET | Freq: Three times a day (TID) | ORAL | 3 refills | Status: DC

## 2016-03-03 NOTE — Telephone Encounter (Signed)
Returned call to patient. He is in the process of switching PCP at the [facility].  His appt is this Friday 03/07/16. Apparantly, the nurse from a Dr Warnell ForesterBachmann's office with [facility] called and began questioning him on the dosing amounts of his medications. Patient is waiting for his metoprolol to be approved by the [facility] doctor but they won't approve it until after his appt if then. Patient is out of is metoprolol and is worried about not taking it. Patient has been on many of these medications for several years.  During the phone call, patient talked extensively about his history of heart attacks, hospitalizations, heart caths, past doctors and lab work. The phone call lasted approx. 45 minutes. Finally, we came up with the plan for the Rx for metoprolol to be sent to his local [facility] pharmacy so he could have a refill until he goes to his new PCP. Advised patient to call us if he needs anything else for us to do to help. Routing to CarMaxSharon Martin, RN as Lorain ChildesFYI.

## 2016-03-03 NOTE — Telephone Encounter (Signed)
New message     patient verbalized that he wants to speak to the rn to have his medications adjusted potassium chloride & plavix and he wants [facility] the [facility]

## 2016-03-06 ENCOUNTER — Telehealth: Payer: Self-pay | Admitting: Cardiology

## 2016-03-06 NOTE — Telephone Encounter (Signed)
Pt returning call from yesterday, he had a problem with his phone, pls call [phone]

## 2016-03-06 NOTE — Telephone Encounter (Signed)
Patient had no question.

## 2016-03-11 ENCOUNTER — Telehealth: Payer: Self-pay | Admitting: Cardiology

## 2016-03-11 NOTE — Telephone Encounter (Signed)
Returned call to patient.He stated he wanted Dr[facility]Harding to know [facility].he has been having a lump in chest for the past week.He has not had lump in chest for the past 3 nights.He stated his B/P has been low in mornings this morning 111/37 pulse 49.He held morning dose of metoprolol at present B/P 133/62 pulse 56.Stated he feels good and he is going to hold morning dose of metoprolol for the next 3 mornings to see how B/P does.He wanted to let Dr.Harding know and get his advice.Message sent to Dr.Harding.

## 2016-03-11 NOTE — Telephone Encounter (Signed)
Please call,concerning his medicine,he will give you all the details.

## 2016-03-12 NOTE — Telephone Encounter (Signed)
He had been taking metoprolol 3 times a day. It may just mean he skips the morning dose now and just takes it later on as 2 times a day as opposed to 3.  I'm not quite sure what a "lump in the chest" is.  DH

## 2016-04-17 ENCOUNTER — Telehealth: Payer: Self-pay | Admitting: Cardiology

## 2016-04-17 NOTE — Telephone Encounter (Signed)
New message  Pt call requesting to speak with Dr. Herbie BaltimoreHarding. Pt states he does not want to relay message through the nurse to the doctor. I did inform pt I would have to send a message to the nurse to get the message to Dr. Herbie BaltimoreHarding. Please call back to discuss

## 2016-04-17 NOTE — Telephone Encounter (Signed)
I like the idea of spacing out the nighttime dosing of beta blocker. Maybe take the p.m. dose a little bit earlier as well.  DH

## 2016-04-17 NOTE — Telephone Encounter (Signed)
Late entry  spoke to patient total.@35  minutes or more Patient initial called because he is not sleeping well.  And blood pressure are lower in the morning between 7 -8 am in the morning   range 80-90/63-69--" weird feeling , eyesight blurry, increase hear rate."  Patient gave rundown of how he taking medication  Morning dose consist zarolxyn 30 min before bumex, potassium 30 to 40 meg , cinnamon, vit-d  2 hours later mgox 500mg   Supper - metoprolol 50 mg   Bedtime metoprolol, isosorbide   --- per patient stopped am dose of metoprolol ( last 2 weeks) and isosorbide.    Per patient - he think there maybe an interaction with taking metoprolol so close to gather, taking morning isosorbide.  RN suggest taking supper dose of metoprolol(5 pm) at lunch time  (1pm) and keep all other medication the same and see how this works.  patient in agreement.  patient also states he will have cataract surgery in jan 2018 [facility] , also try to get motorized wheelchair for home use through the [facility]patient states if doctor would like for him to come in to be seen he would busy  this month and next  for him. Recall appointment schedule for march 2018.   will defer for further instruction?

## 2016-04-21 NOTE — Telephone Encounter (Signed)
Spoke to patient . Informed patient of dr harding in agreement  Of taking beta blocker. Also can move bedtime does 1 to 2 hour later if =needed.  patient state it seems like it is working  keep appointment for march 2018.call if needed.

## 2016-06-03 ENCOUNTER — Telehealth: Payer: Self-pay | Admitting: Cardiology

## 2016-06-03 NOTE — Telephone Encounter (Signed)
Patient aware have requested ECG be faxed to us.

## 2016-06-03 NOTE — Telephone Encounter (Addendum)
Spoke with pt, he recently had cataract surgery and prior to the procedure they ask when he was diagnosed with atrial fib. The patient has never had that diagnosis. The procedure was done at [facility], [phone], I spoke with mary at that facility. They do report that according to the notes in the chart atrial fib was noted on ECG prior to his procedure. I have asked for that ECG to be faxed to us.

## 2016-06-03 NOTE — Telephone Encounter (Signed)
New message   Pt verbalized that he is calling to speak to rn   Pt said that rn in  Dr.Giggingat's office wanted to know when he was diagnosed with A-FIB and he didn't have an answer   He said he was unknown about it

## 2016-06-06 NOTE — Telephone Encounter (Signed)
Follow up    Pt calling stating he was calling back to follow up about your conversation and what you found out.

## 2016-06-06 NOTE — Telephone Encounter (Signed)
Spoke with pt, aware they are unable to find the ECG that was done during his procedure. Burt KnackSharon martin, dr hardings nurse plans on calling the patient back this afternoon.

## 2016-06-06 NOTE — Telephone Encounter (Signed)
Spoke to patient informed Per Dr Herbie Baltimoreharding, can wear a 30 day monitor to find out rhythm - Patient states he is having another cataract surgery on 06/12/16. Please check ekg - to see what rhythm  Patient will contact office- send EKG

## 2016-06-13 IMAGING — CR DG TIBIA/FIBULA 2V*L*
4 series · 4 of 4 positions shown · non-contrast
Comparison: None.

CLINICAL DATA: Lower extremity cellulitis and pain

EXAM:
LEFT TIBIA AND FIBULA - 2 VIEW

[tibia ap (1 of 2)]
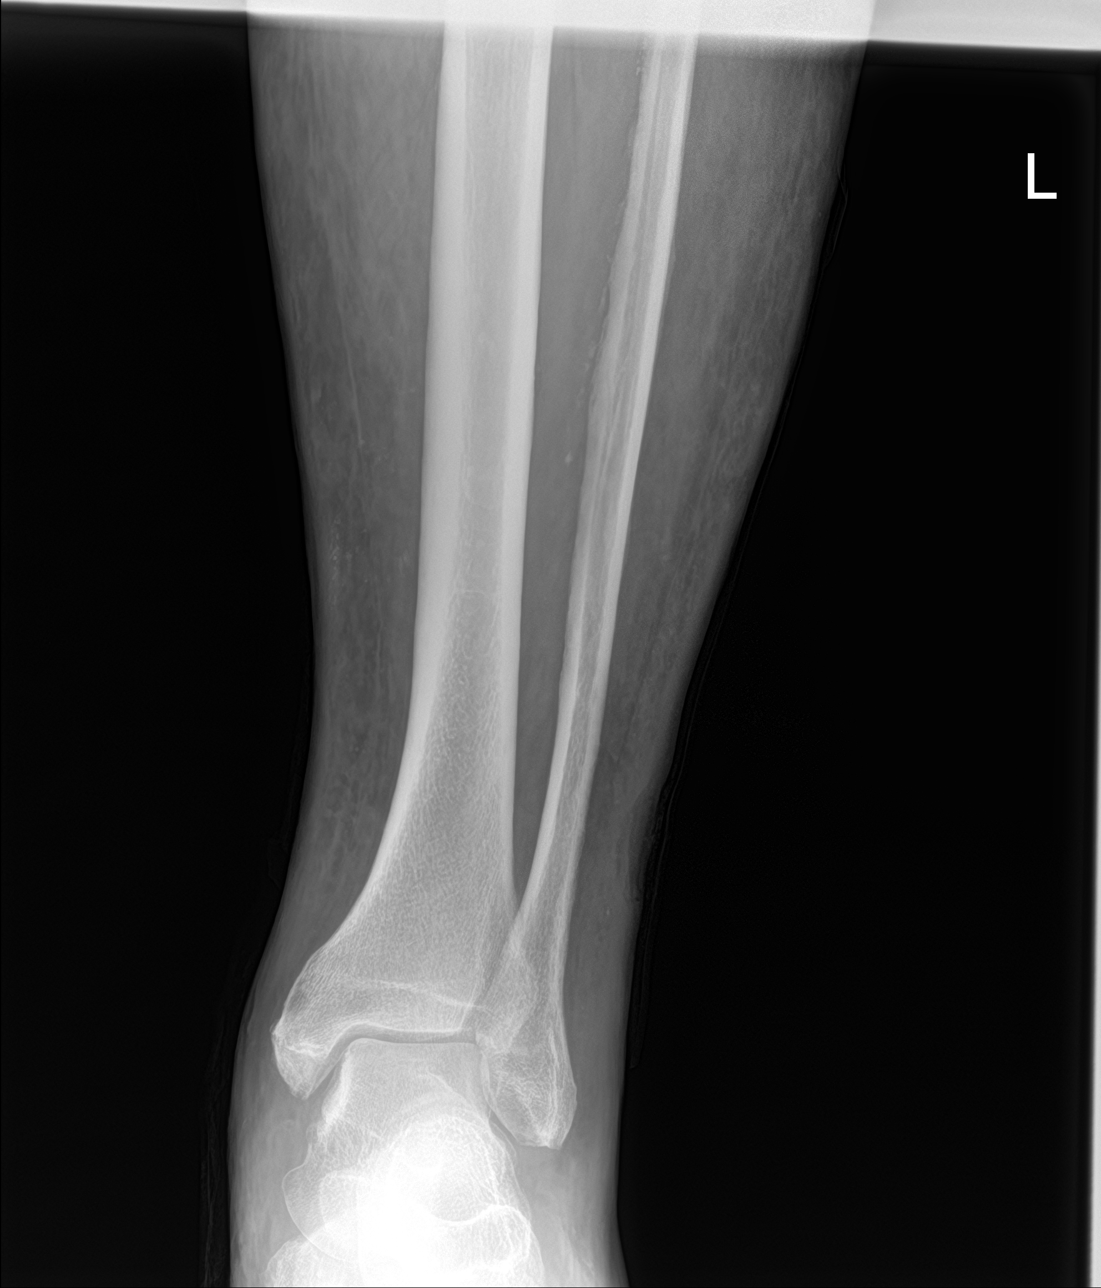

[tibia ap (2 of 2)]
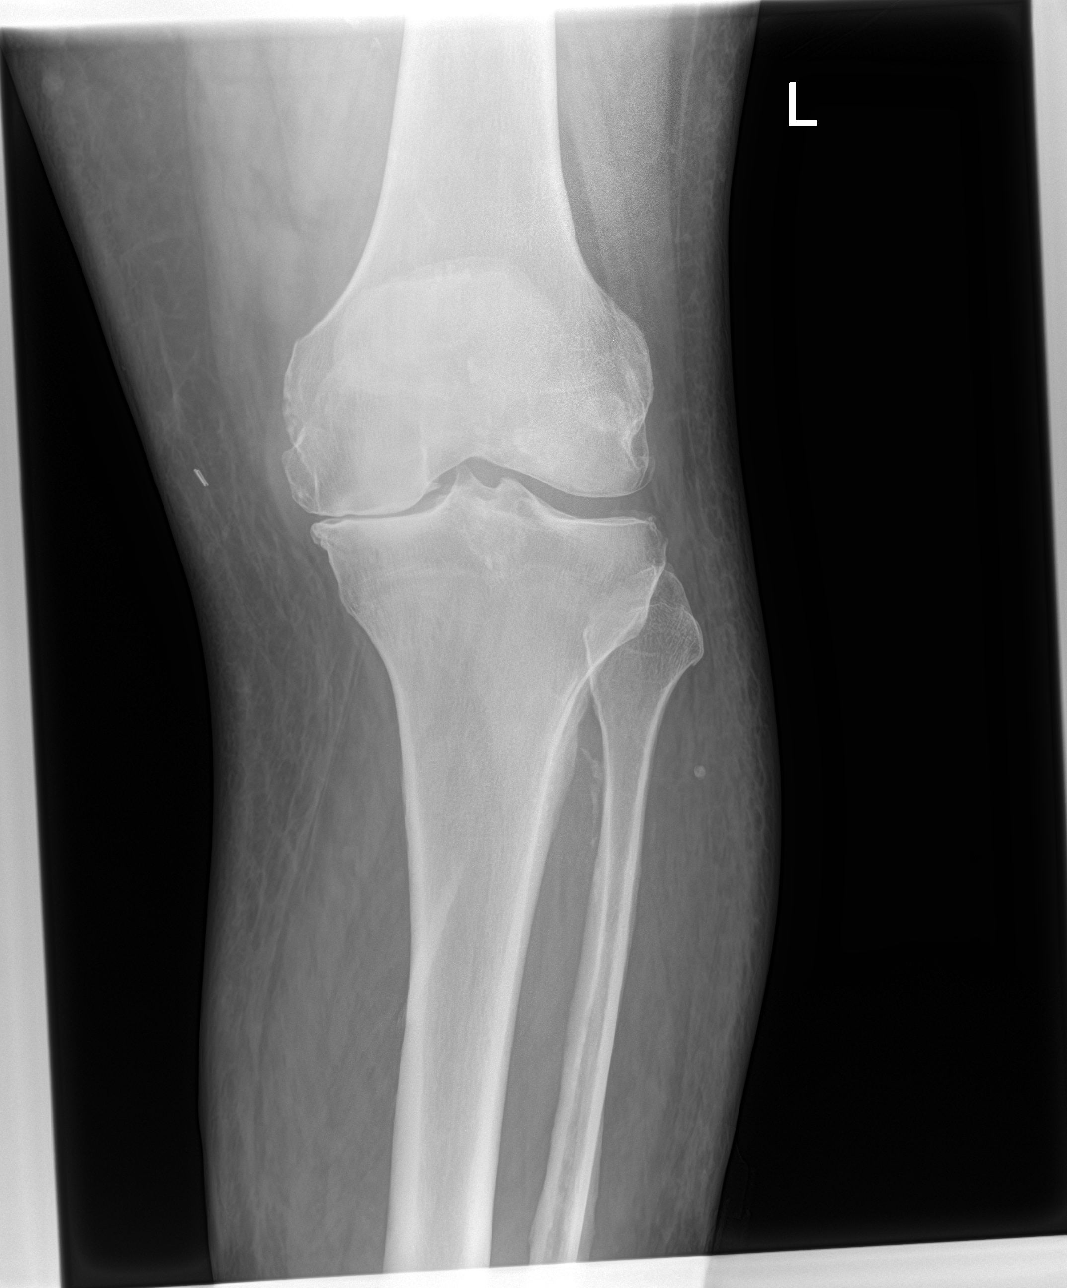

[tibia lat (1 of 2)]
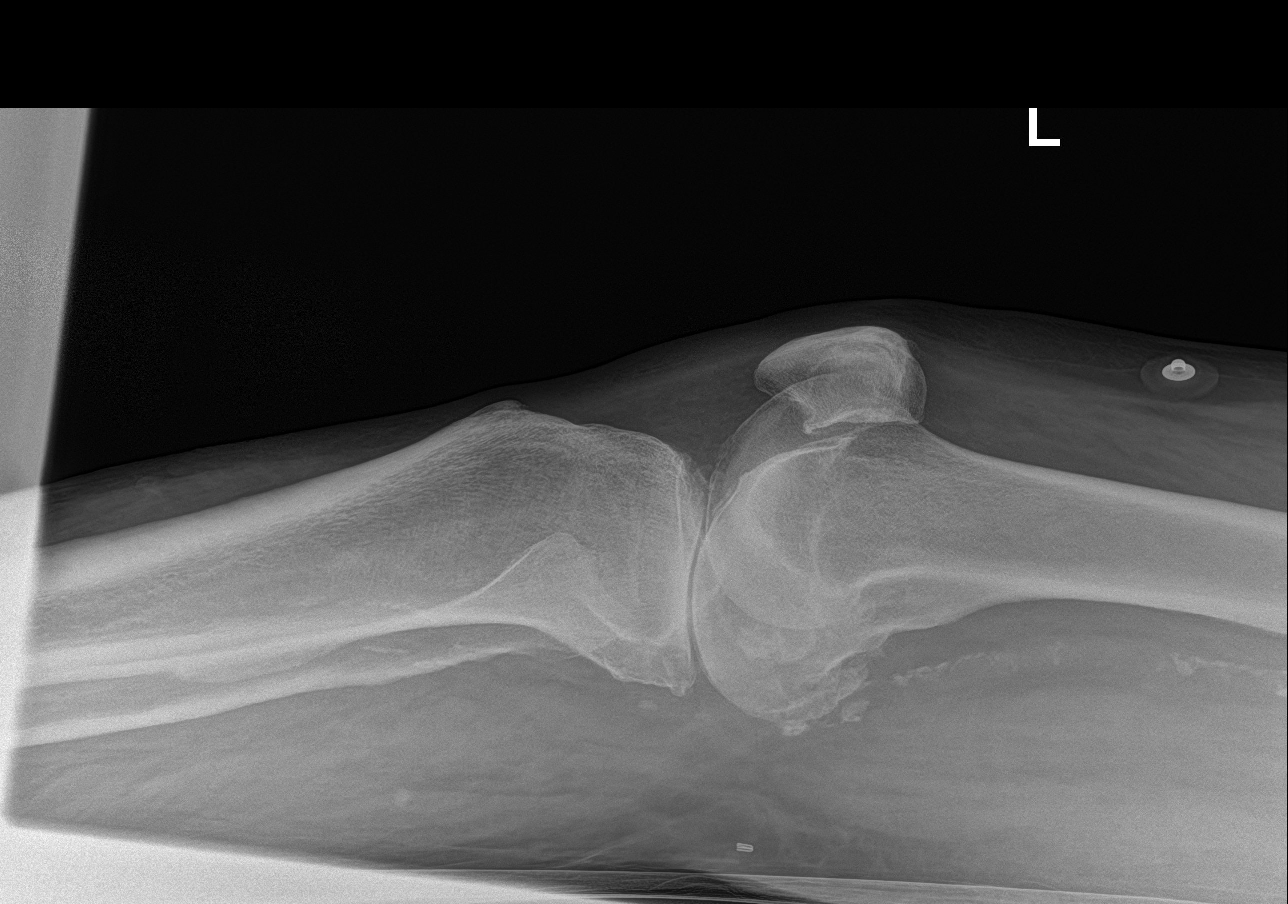

[tibia lat (2 of 2)]
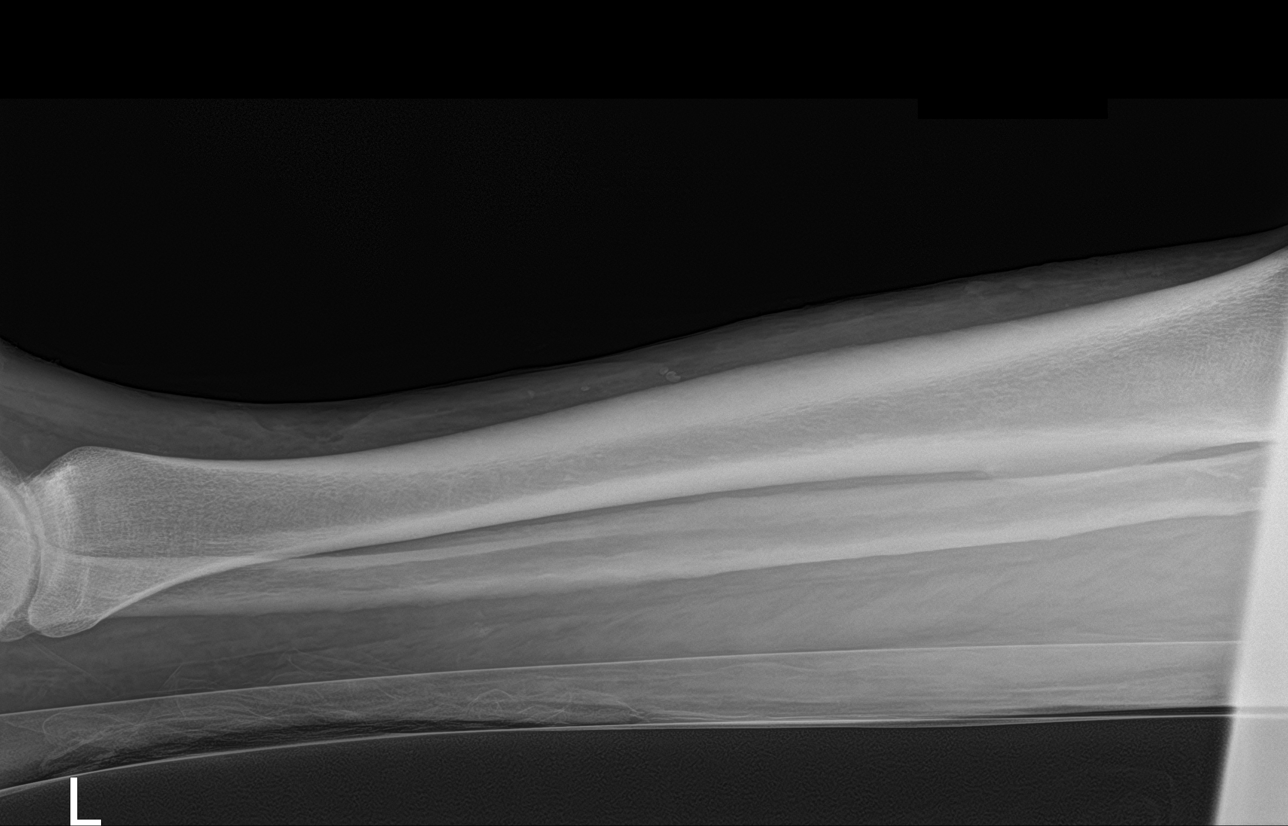

[4 of 4 positions shown; findings below may reference images not displayed]

FINDINGS: Degenerative changes are noted in the medial joint space. No acute
fracture or dislocation is noted. No joint effusion is seen. On the
lateral film distally there is suggestion of an anterior soft tissue
ulcer. Clinical correlation is recommended. No other soft tissue
abnormality is seen.
IMPRESSION: No acute bony abnormality noted.

Possible soft tissue ulcer distally.

## 2016-06-13 IMAGING — CR DG KNEE COMPLETE 4+V*R*
4 series · 4 of 4 positions shown · non-contrast
Comparison: None.

CLINICAL DATA: Bilateral cellulitis with redness, swelling and
pain.

EXAM:
RIGHT KNEE - COMPLETE 4+ VIEW

[knee ap]
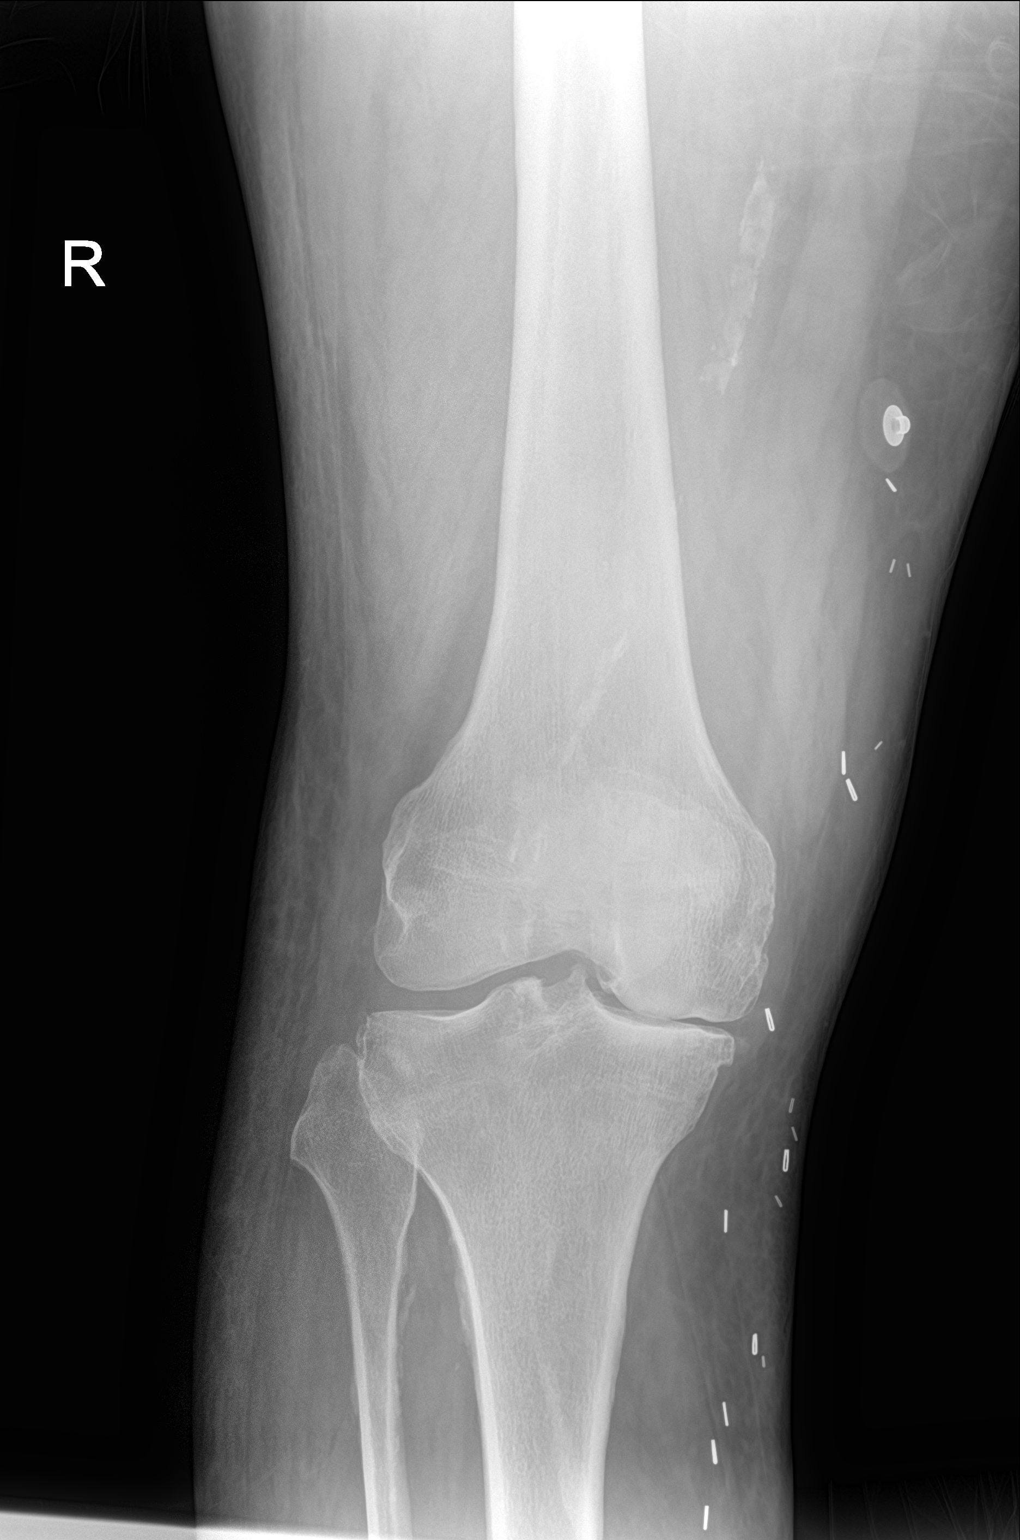

[tunnel]
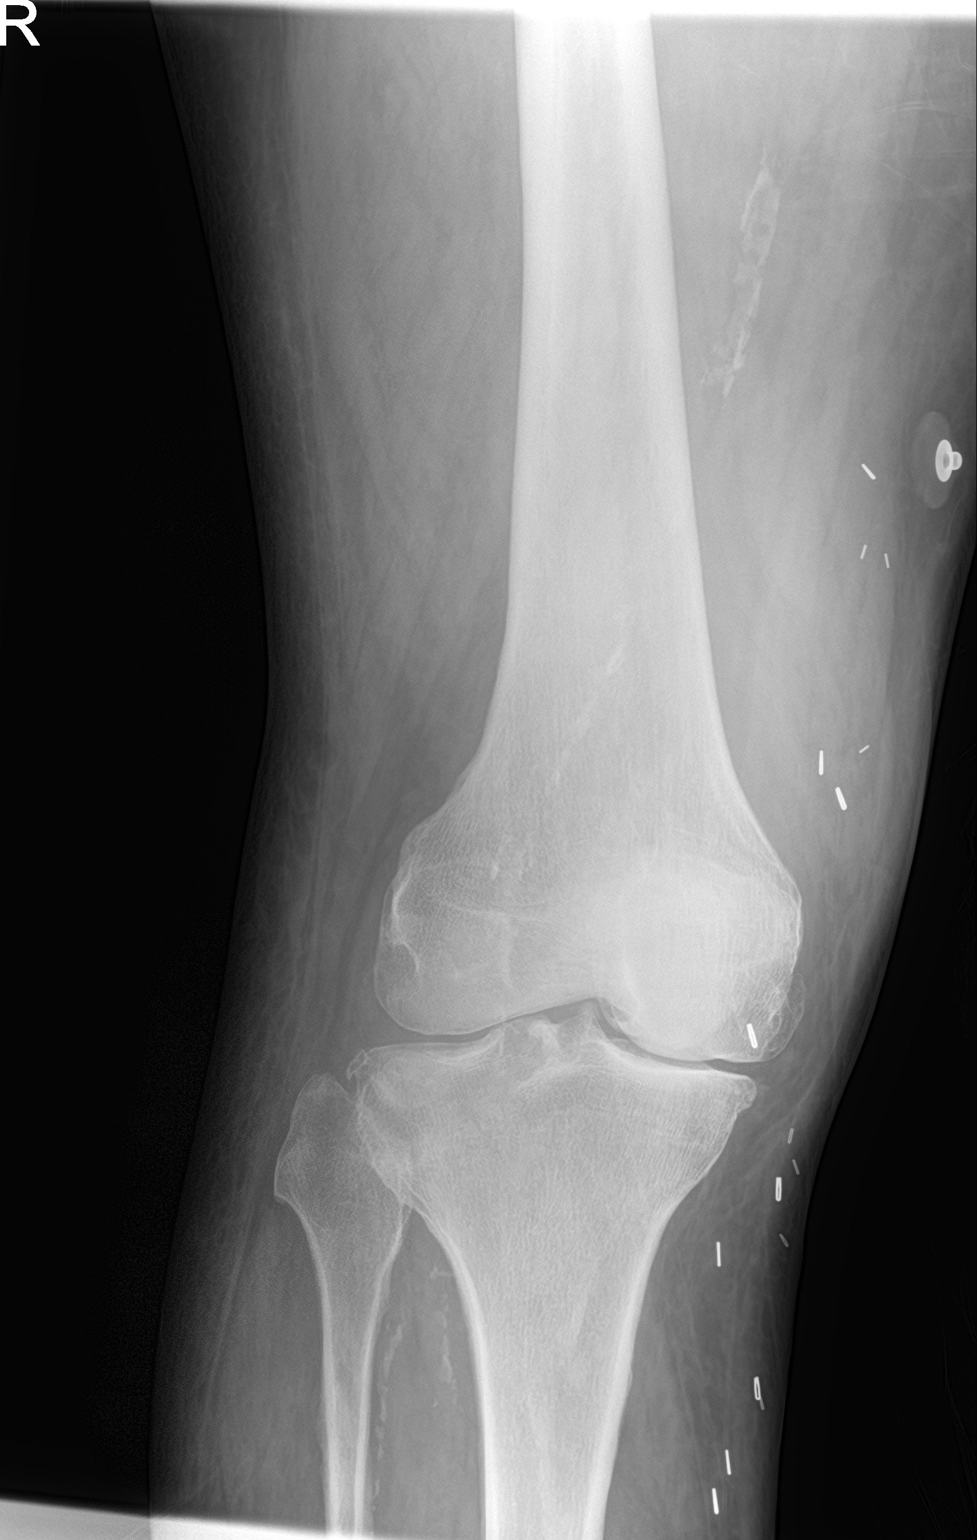

[knee lat]
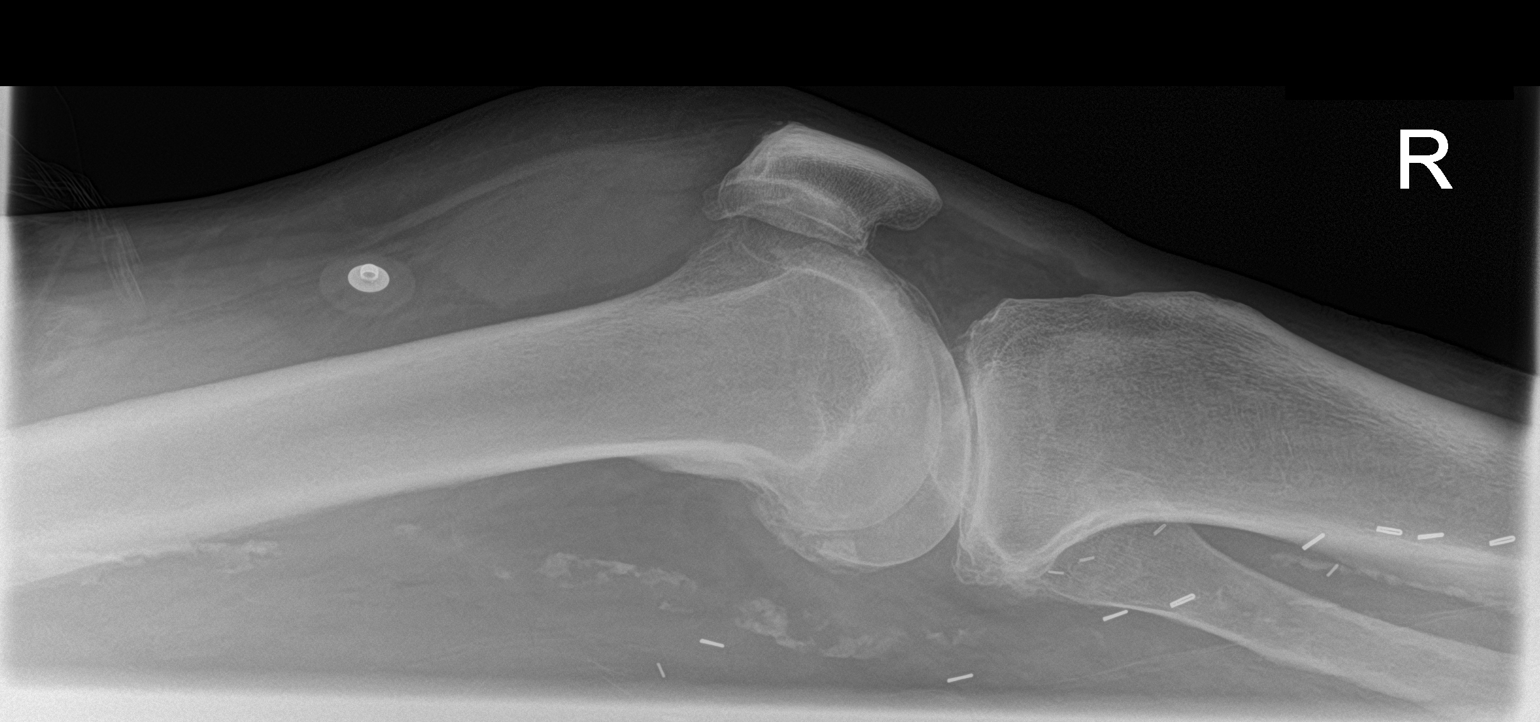

[knee obl]
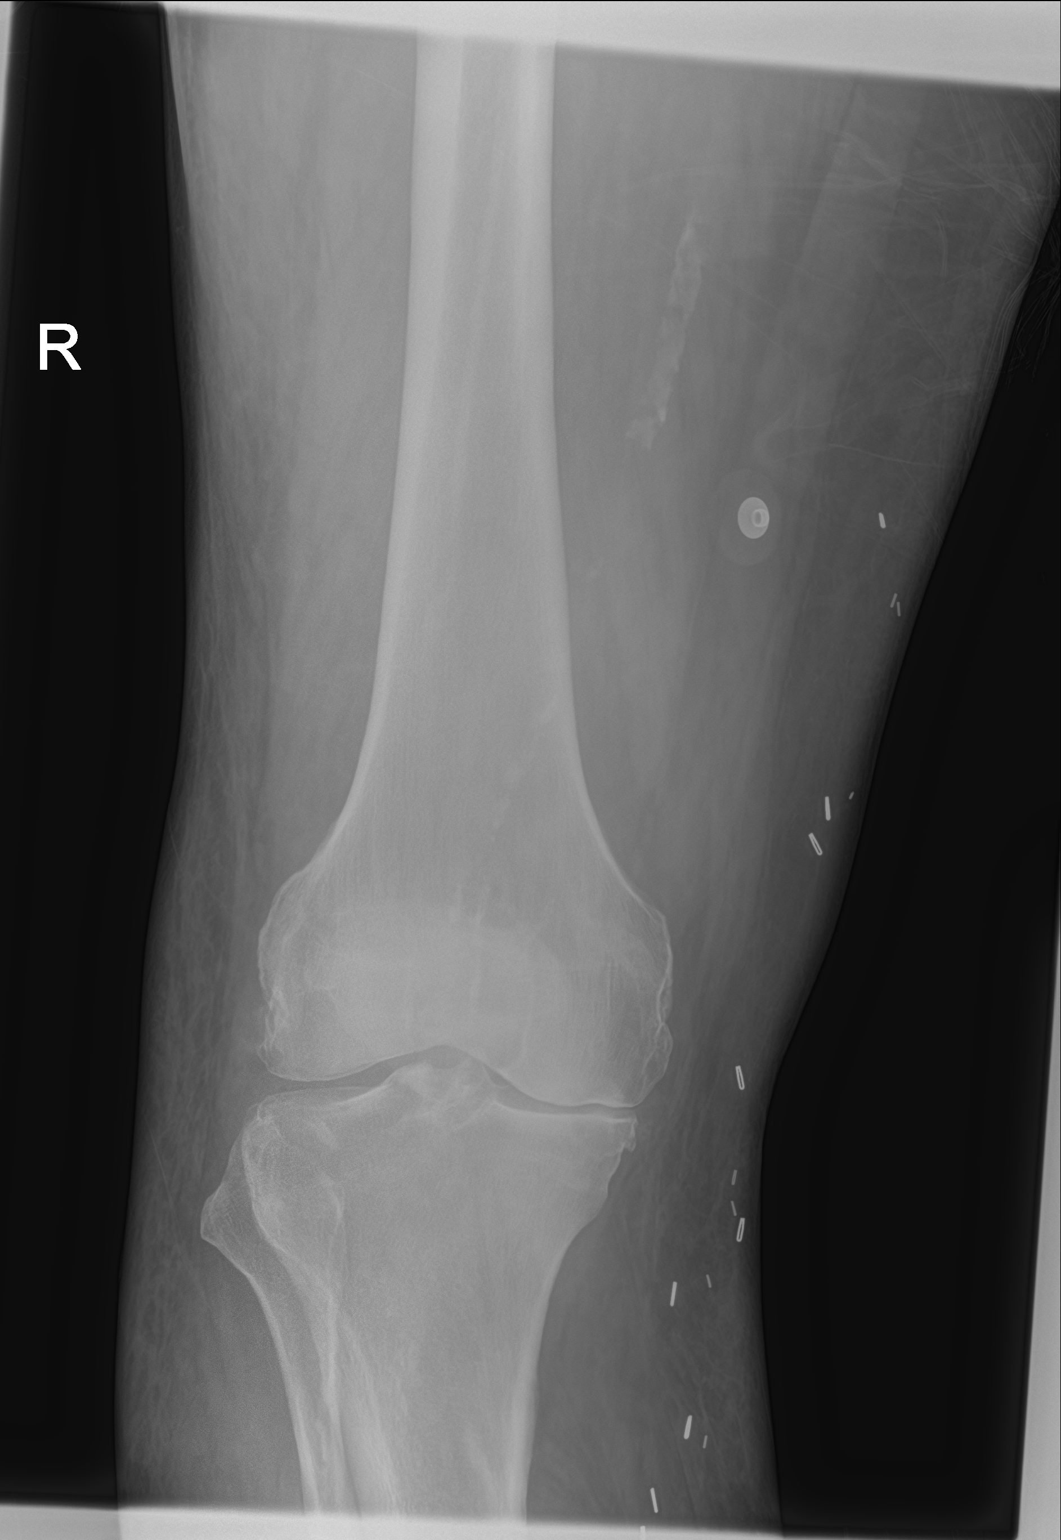

[4 of 4 positions shown; findings below may reference images not displayed]

FINDINGS: There are tricompartmental degenerative changes most prominent over
the medial compartment. Evidence of a small joint effusion. Mild
subcutaneous edema. No air within the soft tissues. No fracture or
dislocation. Multiple surgical clips over the medial soft tissues.
Calcified atherosclerotic plaque is seen over the femoral, popliteal
and lower leg runoff arteries.
IMPRESSION: No acute findings.

Tricompartmental osteoarthritis worse over the medial compartment.
Small joint effusion.

## 2016-06-17 ENCOUNTER — Telehealth: Payer: Self-pay | Admitting: Cardiology

## 2016-06-17 NOTE — Telephone Encounter (Signed)
New message    Pt is calling about EKG, he was speaking with Jasmine DecemberSharon last week and would like for her to call him back.

## 2016-06-17 NOTE — Telephone Encounter (Signed)
S/w pt he states that he called the eye doctors office back and they said that there was no ECG that was printed and that this condition was NOT afib but something else that Dr Herbie Baltimoreharding already knows about(per pt) and he states that he does not remember what it is called but it is not concerning and he will discuss this at his next appointment with Dr Herbie BaltimoreHarding on 07-11-2016.

## 2016-06-17 NOTE — Telephone Encounter (Signed)
I will have no way of knowing what rhythm this is without having EKG. He does have some PACs and PVCs  As long as he is not having symptoms. We can just wait until March.  Bryan Lemmaavid Harding, MD

## 2016-06-18 NOTE — Telephone Encounter (Signed)
Appointment with Dr. Herbie BaltimoreHarding 3/9 @ 3:45 pm.

## 2016-06-30 ENCOUNTER — Telehealth: Payer: Self-pay | Admitting: Cardiology

## 2016-06-30 NOTE — Telephone Encounter (Signed)
New Message  [facility] voiced has [facility] and is requesting progress report to be sent to [phone] w/Attn: Dr. Erma HeritageBachmann.  Please f/u

## 2016-07-01 NOTE — Telephone Encounter (Signed)
Added to notes for upcoming visit encounter.

## 2016-07-02 NOTE — Telephone Encounter (Signed)
WILL GIVE PRESCRIPTION/NOTES AREQUESTED AT  07/11/16 APPOINTMENT

## 2016-07-02 NOTE — Telephone Encounter (Signed)
Follow Up   Per pt he is needing a call from nurse regarding his progress report... Requesting call back  Additional notes see below

## 2016-07-02 NOTE — Telephone Encounter (Signed)
Spoke with pt states that he needs written rx's for all his [facility] and [facility] And needs all recent notes. Pt states that he will wait until his appt 07-11-16.

## 2016-07-11 ENCOUNTER — Encounter: Payer: Self-pay | Admitting: Cardiology

## 2016-07-11 ENCOUNTER — Ambulatory Visit (INDEPENDENT_AMBULATORY_CARE_PROVIDER_SITE_OTHER): Payer: Medicare Other | Admitting: Cardiology

## 2016-07-11 VITALS — BP 113/56 | HR 63

## 2016-07-11 DIAGNOSIS — I25708 Atherosclerosis of coronary artery bypass graft(s), unspecified, with other forms of angina pectoris: Secondary | ICD-10-CM | POA: Diagnosis not present

## 2016-07-11 DIAGNOSIS — I5032 Chronic diastolic (congestive) heart failure: Secondary | ICD-10-CM | POA: Diagnosis not present

## 2016-07-11 DIAGNOSIS — Z8679 Personal history of other diseases of the circulatory system: Secondary | ICD-10-CM

## 2016-07-11 DIAGNOSIS — R600 Localized edema: Secondary | ICD-10-CM | POA: Diagnosis not present

## 2016-07-11 DIAGNOSIS — I214 Non-ST elevation (NSTEMI) myocardial infarction: Secondary | ICD-10-CM | POA: Diagnosis not present

## 2016-07-11 DIAGNOSIS — L97101 Non-pressure chronic ulcer of unspecified thigh limited to breakdown of skin: Secondary | ICD-10-CM

## 2016-07-11 DIAGNOSIS — I208 Other forms of angina pectoris: Secondary | ICD-10-CM | POA: Diagnosis not present

## 2016-07-11 DIAGNOSIS — I272 Pulmonary hypertension, unspecified: Secondary | ICD-10-CM | POA: Diagnosis not present

## 2016-07-11 DIAGNOSIS — I83001 Varicose veins of unspecified lower extremity with ulcer of thigh: Secondary | ICD-10-CM

## 2016-07-11 DIAGNOSIS — I10 Essential (primary) hypertension: Secondary | ICD-10-CM | POA: Diagnosis not present

## 2016-07-11 MED ORDER — BUMETANIDE 1 MG PO TABS: 1.0000 mg | ORAL_TABLET | Freq: Every day | ORAL | 3 refills | Status: AC

## 2016-07-11 MED ORDER — METOLAZONE 5 MG PO TABS: 5.0000 mg | ORAL_TABLET | Freq: Every day | ORAL | 90 refills | Status: AC

## 2016-07-11 MED ORDER — METOPROLOL TARTRATE 50 MG PO TABS: 50.0000 mg | ORAL_TABLET | Freq: Two times a day (BID) | ORAL | 3 refills | Status: AC

## 2016-07-11 MED ORDER — ISOSORBIDE MONONITRATE ER 30 MG PO TB24: 30.0000 mg | ORAL_TABLET | Freq: Two times a day (BID) | ORAL | 3 refills | Status: AC

## 2016-07-11 MED ORDER — POTASSIUM CHLORIDE CRYS ER 20 MEQ PO TBCR: 30.0000 meq | EXTENDED_RELEASE_TABLET | Freq: Every day | ORAL | 3 refills | Status: AC

## 2016-07-11 MED ORDER — NITROGLYCERIN 0.4 MG SL SUBL: 0.4000 mg | SUBLINGUAL_TABLET | SUBLINGUAL | 6 refills | Status: AC | PRN

## 2016-07-11 MED ORDER — CLOPIDOGREL BISULFATE 75 MG PO TABS: 75.0000 mg | ORAL_TABLET | Freq: Every day | ORAL | 3 refills | Status: AC

## 2016-07-11 NOTE — Patient Instructions (Addendum)
Continue with medications If you have to use  NTG more than usually take 60 mg IMDUR for twice DAILYFOR  2-3 days then return to 30 mg twice a day      -go to [employer] on your smartphone- download APP EKG KARDIA- [facility]    Your physician wants you to follow-up in 4 months with DR HARDING.You will receive a reminder letter in the mail two months in advance. If you don't receive a letter, please call our office to schedule the follow-up appointment.
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
## 2016-07-16 ENCOUNTER — Encounter: Payer: Self-pay | Admitting: Cardiology

## 2016-07-16 DIAGNOSIS — Z8679 Personal history of other diseases of the circulatory system: Secondary | ICD-10-CM | POA: Insufficient documentation

## 2016-07-16 NOTE — Assessment & Plan Note (Signed)
Overall improved. Increased stress leads to worsening symptoms. Ex line we discussed when necessary episodes of increasing to 60 mg twice a day of Imdur as opposed to 30 for couple days. Maintaining adequate diuresis and using nighttime oxygen will also help reduce anginal symptoms. -- Likely both macro and microvascular related. He does not have that many remaining conduits patent.

## 2016-07-16 NOTE — Assessment & Plan Note (Signed)
He does have diastolic dysfunction based on his PND and orthopnea, but he probably also has pulmonary hypertension from obesity. It seems to be doing quite well with this combination of Zaroxolyn and Bumex. We talked about when necessary additional dosing here and there.  He is on a beta blocker (metoprolol tartrate - which is acceptable with diastolic dysfunction only) along with nitrate. With renal insufficiency. Issues with hypotension, he is no longer on ARB.

## 2016-07-16 NOTE — Assessment & Plan Note (Signed)
Problematic as far as weight loss goes because of his inability to walk. He was 265 pounds at the end of 2016, had been up to almost 300 pounds. At this point we are really limited to dietary modification.

## 2016-07-16 NOTE — Assessment & Plan Note (Signed)
Pretty much stable if anything hypotensive now. No longer on ACE inhibitor.

## 2016-07-16 NOTE — Assessment & Plan Note (Signed)
Multiple different MIs in the past. Mostly has diastolic dysfunction from a combination of macrovascular and microvascular disease. Chronic stable angina now, but no recent significant episodes that if warranted his going to the hospital.

## 2016-07-16 NOTE — Assessment & Plan Note (Signed)
Clearly he has OSA/OHS. Being managed by primary. The improved diuresis seems to help.

## 2016-07-16 NOTE — Assessment & Plan Note (Signed)
He says that on 2 occasions these and told he had A. fib, but nothing to actually demonstrate A. fib with EKG or telemetry. He quite likely could have A. fib, but we may never catch it. He doesn't notice having any symptoms. It would be nice to have some type of documentation of these "arrhythmias ". We talked about wearing a monitor, but we may miss something. He asked about any monitors that he could carry with him. There is a application for the iPhone that can potentially help monitor rhythms. Currently with her financial situation which he would not afford it, but this would be a good option going forward

## 2016-07-16 NOTE — Assessment & Plan Note (Signed)
Chronic venous hypertension and lower extremities. Chronic external compression with Ace wraps/support stockings. Currently he is managing his wound care by himself. Very important to maintain adequate diuresis. Elevate feet when possible.

## 2016-07-16 NOTE — Assessment & Plan Note (Signed)
I think his recent rash of increased angina is probably related to his social stress. He may be to increase his Imdur to 60 twice a day for days when he's had to take more nitroglycerin. He would do this for couple days and then return to 30 mg twice a day. The additional 15 mg is when necessary. He does better with spacing out his Imdur and he does taking once daily. I tried to explain that this is counterintuitive, but he takes his medications how best suits him. Doing better with the only twice a day metoprolol is posted 3 times a day this point with less hypotension and orthostasis. Remaining statin intolerant.  Very difficult to manage, he does not have many conduits left with PCI options. Unless symptoms significantly worsen, he would prefer to avoid further invasive testing.

## 2016-08-25 ENCOUNTER — Telehealth: Payer: Self-pay | Admitting: Cardiology

## 2016-08-25 NOTE — Telephone Encounter (Signed)
That sounds fine - He may need to continue using Zaroxolyn as well.  Bryan Lemma, MD

## 2016-08-25 NOTE — Telephone Encounter (Signed)
Returned the phone call to the patient. He stated that the bumetanide 1 mg is no longer working and he has developed water blisters bilaterally on his lower extremities. He stated that he has started torsemide 100 mg daily, pills that he had left over. He stated that he has been taking this since Saturday and has seen improvement. He wanted to know if his medication list could be changed and for this new information to be faxed over to the [facility]. Will route to the physician for further recommendations.

## 2016-08-25 NOTE — Telephone Encounter (Signed)
New message      Talk to a nurse.  Pt had to restart torsemide on his own because of water blisters on legs. Please call

## 2016-08-26 NOTE — Telephone Encounter (Signed)
Spoke to patient. Informed him information will be sent to [facility] THIS WEEK . PATIENT STATES HE IS USING ZAROXOLYN  PRIOR TO USE OF BUMEX AND OR TORSEMIDE. HE ONLY USE ONE MEDICATION NOW - TORSEMIDE  VERBALIZED UNDERSTANDING.

## 2016-09-02 MED ORDER — TORSEMIDE 100 MG PO TABS: ORAL_TABLET | ORAL | 3 refills | Status: AC

## 2016-09-02 NOTE — Telephone Encounter (Signed)
PATIENT AWARE INFORMATION SENT TO [facility].

## 2016-09-02 NOTE — Telephone Encounter (Signed)
Forward information [facility] Jackquline Bosch  Torsemide 50 to 100 mg by mouth daily 30 minutes after taking Metolazone 5 mg in morning. And current medication list Per Dr Herbie Baltimore Per patient request

## 2016-09-04 ENCOUNTER — Telehealth: Payer: Self-pay | Admitting: Cardiology

## 2016-09-12 ENCOUNTER — Ambulatory Visit: Payer: Medicare Other | Admitting: Sports Medicine

## 2016-10-03 NOTE — Telephone Encounter (Signed)
09/22/2016 [facility] from [facility] to see if Dr. Herbie BaltimoreHarding would sign death certificate, spoke with Dr. Herbie BaltimoreHarding he said that he would like for them to see if his PCP would sign it, if not then he would and that they need to send information on how he died. cbr

## 2016-10-03 DEATH — deceased
